# Patient Record
Sex: Female | Born: 1935 | Race: Black or African American | Hispanic: No | Smoking: Former smoker
Health system: Southern US, Community
[De-identification: ages and names within clinical notes are randomized; demographics above are authoritative.]

## PROBLEM LIST (undated history)

## (undated) DIAGNOSIS — Z9119 Patient's noncompliance with other medical treatment and regimen: Secondary | ICD-10-CM

## (undated) DIAGNOSIS — IMO0002 Reserved for concepts with insufficient information to code with codable children: Secondary | ICD-10-CM

## (undated) DIAGNOSIS — I4891 Unspecified atrial fibrillation: Secondary | ICD-10-CM

## (undated) DIAGNOSIS — R609 Edema, unspecified: Secondary | ICD-10-CM

## (undated) DIAGNOSIS — R9430 Abnormal result of cardiovascular function study, unspecified: Secondary | ICD-10-CM

## (undated) DIAGNOSIS — M1990 Unspecified osteoarthritis, unspecified site: Secondary | ICD-10-CM

## (undated) DIAGNOSIS — E785 Hyperlipidemia, unspecified: Secondary | ICD-10-CM

## (undated) DIAGNOSIS — I340 Nonrheumatic mitral (valve) insufficiency: Secondary | ICD-10-CM

## (undated) DIAGNOSIS — R001 Bradycardia, unspecified: Secondary | ICD-10-CM

## (undated) DIAGNOSIS — I272 Pulmonary hypertension, unspecified: Secondary | ICD-10-CM

## (undated) DIAGNOSIS — I351 Nonrheumatic aortic (valve) insufficiency: Secondary | ICD-10-CM

## (undated) DIAGNOSIS — I509 Heart failure, unspecified: Secondary | ICD-10-CM

## (undated) DIAGNOSIS — F0390 Unspecified dementia without behavioral disturbance: Secondary | ICD-10-CM

## (undated) DIAGNOSIS — Z91199 Patient's noncompliance with other medical treatment and regimen due to unspecified reason: Secondary | ICD-10-CM

## (undated) DIAGNOSIS — I10 Essential (primary) hypertension: Secondary | ICD-10-CM

## (undated) DIAGNOSIS — R5383 Other fatigue: Secondary | ICD-10-CM

## (undated) DIAGNOSIS — I2510 Atherosclerotic heart disease of native coronary artery without angina pectoris: Secondary | ICD-10-CM

## (undated) DIAGNOSIS — N184 Chronic kidney disease, stage 4 (severe): Secondary | ICD-10-CM

## (undated) HISTORY — DX: Nonrheumatic mitral (valve) insufficiency: I34.0

## (undated) HISTORY — DX: Other fatigue: R53.83

## (undated) HISTORY — DX: Unspecified dementia, unspecified severity, without behavioral disturbance, psychotic disturbance, mood disturbance, and anxiety: F03.90

## (undated) HISTORY — PX: CARDIAC CATHETERIZATION: SHX172

## (undated) HISTORY — DX: Edema, unspecified: R60.9

## (undated) HISTORY — DX: Hyperlipidemia, unspecified: E78.5

## (undated) HISTORY — DX: Unspecified atrial fibrillation: I48.91

## (undated) HISTORY — DX: Essential (primary) hypertension: I10

## (undated) HISTORY — DX: Abnormal result of cardiovascular function study, unspecified: R94.30

## (undated) HISTORY — DX: Pulmonary hypertension, unspecified: I27.20

## (undated) HISTORY — DX: Patient's noncompliance with other medical treatment and regimen: Z91.19

## (undated) HISTORY — DX: Atherosclerotic heart disease of native coronary artery without angina pectoris: I25.10

## (undated) HISTORY — DX: Unspecified osteoarthritis, unspecified site: M19.90

## (undated) HISTORY — DX: Chronic kidney disease, stage 4 (severe): N18.4

## (undated) HISTORY — PX: CATARACT EXTRACTION: SUR2

## (undated) HISTORY — DX: Reserved for concepts with insufficient information to code with codable children: IMO0002

## (undated) HISTORY — DX: Patient's noncompliance with other medical treatment and regimen due to unspecified reason: Z91.199

## (undated) HISTORY — DX: Nonrheumatic aortic (valve) insufficiency: I35.1

## (undated) HISTORY — DX: Heart failure, unspecified: I50.9

## (undated) HISTORY — DX: Bradycardia, unspecified: R00.1

---

## 1998-03-22 ENCOUNTER — Ambulatory Visit (HOSPITAL_COMMUNITY): Admission: RE | Admit: 1998-03-22 | Discharge: 1998-03-22 | Payer: Self-pay

## 1999-09-23 ENCOUNTER — Ambulatory Visit (HOSPITAL_COMMUNITY): Admission: RE | Admit: 1999-09-23 | Discharge: 1999-09-23 | Payer: Self-pay | Admitting: Internal Medicine

## 2000-01-15 ENCOUNTER — Ambulatory Visit (HOSPITAL_COMMUNITY): Admission: RE | Admit: 2000-01-15 | Discharge: 2000-01-15 | Payer: Self-pay | Admitting: *Deleted

## 2000-12-21 ENCOUNTER — Other Ambulatory Visit: Admission: RE | Admit: 2000-12-21 | Discharge: 2000-12-21 | Payer: Self-pay | Admitting: Family Medicine

## 2001-01-29 ENCOUNTER — Inpatient Hospital Stay (HOSPITAL_COMMUNITY): Admission: EM | Admit: 2001-01-29 | Discharge: 2001-02-02 | Payer: Self-pay | Admitting: *Deleted

## 2001-01-29 ENCOUNTER — Encounter: Payer: Self-pay | Admitting: *Deleted

## 2001-01-29 ENCOUNTER — Encounter (INDEPENDENT_AMBULATORY_CARE_PROVIDER_SITE_OTHER): Payer: Self-pay | Admitting: Specialist

## 2001-01-29 ENCOUNTER — Encounter: Payer: Self-pay | Admitting: Internal Medicine

## 2001-01-31 ENCOUNTER — Encounter: Payer: Self-pay | Admitting: Internal Medicine

## 2001-02-14 ENCOUNTER — Emergency Department (HOSPITAL_COMMUNITY): Admission: EM | Admit: 2001-02-14 | Discharge: 2001-02-14 | Payer: Self-pay | Admitting: Emergency Medicine

## 2001-12-15 ENCOUNTER — Ambulatory Visit (HOSPITAL_COMMUNITY): Admission: RE | Admit: 2001-12-15 | Discharge: 2001-12-15 | Payer: Self-pay | Admitting: Internal Medicine

## 2001-12-15 ENCOUNTER — Encounter: Payer: Self-pay | Admitting: Internal Medicine

## 2001-12-30 ENCOUNTER — Encounter: Payer: Self-pay | Admitting: Internal Medicine

## 2001-12-30 ENCOUNTER — Encounter: Admission: RE | Admit: 2001-12-30 | Discharge: 2001-12-30 | Payer: Self-pay | Admitting: Internal Medicine

## 2002-03-07 ENCOUNTER — Encounter: Payer: Self-pay | Admitting: Ophthalmology

## 2002-03-10 ENCOUNTER — Ambulatory Visit (HOSPITAL_COMMUNITY): Admission: RE | Admit: 2002-03-10 | Discharge: 2002-03-11 | Payer: Self-pay | Admitting: Ophthalmology

## 2002-03-23 ENCOUNTER — Encounter: Admission: RE | Admit: 2002-03-23 | Discharge: 2002-03-23 | Payer: Self-pay | Admitting: Internal Medicine

## 2002-07-07 ENCOUNTER — Encounter: Admission: RE | Admit: 2002-07-07 | Discharge: 2002-07-07 | Payer: Self-pay | Admitting: Internal Medicine

## 2002-07-22 ENCOUNTER — Encounter: Admission: RE | Admit: 2002-07-22 | Discharge: 2002-07-22 | Payer: Self-pay | Admitting: Internal Medicine

## 2002-08-29 ENCOUNTER — Ambulatory Visit (HOSPITAL_COMMUNITY): Admission: RE | Admit: 2002-08-29 | Discharge: 2002-08-29 | Payer: Self-pay | Admitting: Internal Medicine

## 2002-08-29 ENCOUNTER — Encounter: Payer: Self-pay | Admitting: Internal Medicine

## 2002-08-29 ENCOUNTER — Encounter: Admission: RE | Admit: 2002-08-29 | Discharge: 2002-08-29 | Payer: Self-pay | Admitting: Internal Medicine

## 2002-09-02 ENCOUNTER — Encounter: Admission: RE | Admit: 2002-09-02 | Discharge: 2002-10-05 | Payer: Self-pay | Admitting: Internal Medicine

## 2002-09-16 ENCOUNTER — Encounter: Payer: Self-pay | Admitting: Internal Medicine

## 2002-09-16 ENCOUNTER — Encounter: Admission: RE | Admit: 2002-09-16 | Discharge: 2002-09-16 | Payer: Self-pay | Admitting: Internal Medicine

## 2002-09-16 ENCOUNTER — Inpatient Hospital Stay (HOSPITAL_COMMUNITY): Admission: AD | Admit: 2002-09-16 | Discharge: 2002-09-20 | Payer: Self-pay | Admitting: Internal Medicine

## 2002-09-20 ENCOUNTER — Encounter: Payer: Self-pay | Admitting: Cardiology

## 2002-09-23 ENCOUNTER — Encounter: Admission: RE | Admit: 2002-09-23 | Discharge: 2002-09-23 | Payer: Self-pay | Admitting: Internal Medicine

## 2002-09-26 ENCOUNTER — Encounter: Admission: RE | Admit: 2002-09-26 | Discharge: 2002-09-26 | Payer: Self-pay | Admitting: Internal Medicine

## 2002-10-19 ENCOUNTER — Encounter: Admission: RE | Admit: 2002-10-19 | Discharge: 2002-10-19 | Payer: Self-pay | Admitting: Internal Medicine

## 2003-06-09 ENCOUNTER — Encounter: Admission: RE | Admit: 2003-06-09 | Discharge: 2003-06-09 | Payer: Self-pay | Admitting: Internal Medicine

## 2003-06-09 ENCOUNTER — Encounter: Payer: Self-pay | Admitting: Internal Medicine

## 2003-07-14 ENCOUNTER — Encounter: Payer: Self-pay | Admitting: Internal Medicine

## 2003-07-14 ENCOUNTER — Ambulatory Visit (HOSPITAL_COMMUNITY): Admission: RE | Admit: 2003-07-14 | Discharge: 2003-07-14 | Payer: Self-pay | Admitting: Internal Medicine

## 2004-12-06 ENCOUNTER — Ambulatory Visit: Payer: Self-pay | Admitting: Gastroenterology

## 2004-12-11 ENCOUNTER — Ambulatory Visit (HOSPITAL_COMMUNITY): Admission: RE | Admit: 2004-12-11 | Discharge: 2004-12-11 | Payer: Self-pay | Admitting: Gastroenterology

## 2004-12-17 ENCOUNTER — Ambulatory Visit: Payer: Self-pay | Admitting: Gastroenterology

## 2005-01-08 ENCOUNTER — Ambulatory Visit: Payer: Self-pay | Admitting: Internal Medicine

## 2005-03-07 ENCOUNTER — Ambulatory Visit: Payer: Self-pay | Admitting: Internal Medicine

## 2005-05-09 ENCOUNTER — Encounter: Admission: RE | Admit: 2005-05-09 | Discharge: 2005-05-09 | Payer: Self-pay | Admitting: Internal Medicine

## 2005-06-26 ENCOUNTER — Ambulatory Visit: Payer: Self-pay | Admitting: Internal Medicine

## 2005-09-25 ENCOUNTER — Ambulatory Visit: Payer: Self-pay | Admitting: Internal Medicine

## 2005-09-26 ENCOUNTER — Ambulatory Visit: Payer: Self-pay | Admitting: Internal Medicine

## 2005-12-29 ENCOUNTER — Ambulatory Visit: Payer: Self-pay | Admitting: Internal Medicine

## 2006-01-06 ENCOUNTER — Ambulatory Visit: Payer: Self-pay | Admitting: Internal Medicine

## 2006-01-06 ENCOUNTER — Ambulatory Visit (HOSPITAL_COMMUNITY): Admission: RE | Admit: 2006-01-06 | Discharge: 2006-01-06 | Payer: Self-pay | Admitting: Internal Medicine

## 2006-05-27 ENCOUNTER — Ambulatory Visit: Payer: Self-pay | Admitting: Internal Medicine

## 2006-09-14 ENCOUNTER — Ambulatory Visit: Payer: Self-pay | Admitting: Internal Medicine

## 2006-10-26 ENCOUNTER — Ambulatory Visit: Payer: Self-pay | Admitting: Internal Medicine

## 2007-04-06 ENCOUNTER — Ambulatory Visit: Payer: Self-pay | Admitting: Internal Medicine

## 2007-04-06 LAB — CONVERTED CEMR LAB
Albumin: 4 g/dL (ref 3.5–5.2)
Alkaline Phosphatase: 84 units/L (ref 39–117)
Bilirubin, Direct: 0.1 mg/dL (ref 0.0–0.3)
CO2: 27 meq/L (ref 19–32)
Creatinine, Ser: 1.6 mg/dL — ABNORMAL HIGH (ref 0.4–1.2)
Eosinophils Absolute: 0.1 10*3/uL (ref 0.0–0.6)
Folate: 18.5 ng/mL
GFR calc non Af Amer: 34 mL/min
Hemoglobin: 11.6 g/dL — ABNORMAL LOW (ref 12.0–15.0)
Lymphocytes Relative: 27.1 % (ref 12.0–46.0)
Monocytes Absolute: 1 10*3/uL — ABNORMAL HIGH (ref 0.2–0.7)
Neutrophils Relative %: 64 % (ref 43.0–77.0)
Platelets: 281 10*3/uL (ref 150–400)
Potassium: 4.1 meq/L (ref 3.5–5.1)
RBC: 3.53 M/uL — ABNORMAL LOW (ref 3.87–5.11)
Sodium: 141 meq/L (ref 135–145)
Total Protein: 7.2 g/dL (ref 6.0–8.3)
Vitamin B-12: 396 pg/mL (ref 211–911)

## 2007-04-16 ENCOUNTER — Ambulatory Visit: Payer: Self-pay | Admitting: Cardiology

## 2007-04-29 ENCOUNTER — Ambulatory Visit: Payer: Self-pay | Admitting: Internal Medicine

## 2007-05-04 ENCOUNTER — Ambulatory Visit: Payer: Self-pay | Admitting: Internal Medicine

## 2007-10-15 ENCOUNTER — Encounter: Admission: RE | Admit: 2007-10-15 | Discharge: 2007-10-15 | Payer: Self-pay | Admitting: Internal Medicine

## 2008-11-07 ENCOUNTER — Encounter: Admission: RE | Admit: 2008-11-07 | Discharge: 2008-11-07 | Payer: Self-pay | Admitting: Internal Medicine

## 2009-05-08 ENCOUNTER — Emergency Department (HOSPITAL_COMMUNITY): Admission: EM | Admit: 2009-05-08 | Discharge: 2009-05-08 | Payer: Self-pay | Admitting: Emergency Medicine

## 2009-08-04 ENCOUNTER — Encounter: Admission: RE | Admit: 2009-08-04 | Discharge: 2009-08-04 | Payer: Self-pay | Admitting: Internal Medicine

## 2010-02-26 ENCOUNTER — Emergency Department (HOSPITAL_COMMUNITY)
Admission: EM | Admit: 2010-02-26 | Discharge: 2010-02-26 | Payer: Self-pay | Source: Home / Self Care | Admitting: Family Medicine

## 2010-12-22 ENCOUNTER — Encounter: Payer: Self-pay | Admitting: Internal Medicine

## 2011-02-24 LAB — POCT I-STAT, CHEM 8
Calcium, Ion: 1.27 mmol/L (ref 1.12–1.32)
Chloride: 104 mEq/L (ref 96–112)
Glucose, Bld: 102 mg/dL — ABNORMAL HIGH (ref 70–99)
Hemoglobin: 13.3 g/dL (ref 12.0–15.0)
TCO2: 29 mmol/L (ref 0–100)

## 2011-03-10 LAB — URIC ACID: Uric Acid, Serum: 5.4 mg/dL (ref 2.4–7.0)

## 2011-04-15 NOTE — Procedures (Signed)
[facility]                                PROCEDURE NOTE   NAME:Kristi [name], Kristi [name]                   MRN:          [id number]  DATE:04/06/2007                            DOB:          1931/09/25    PROCEDURE:  Left shoulder injection.   INDICATION:  Refractory shoulder arthritis, subacromial bursitis.  Risks  including unsuccessful procedure, bleeding, infection and other, as well  as benefits were explained to the patient and her daughter; they agreed  to proceed.   DESCRIPTION OF PROCEDURE:  The left shoulder was prepped with Betadine  and alcohol and injected in usual fashion with 80 mg of Depo-Medrol and  3 mL of 2% lidocaine (posterior approach), tolerated well, complications  -- none, excellent pain relief immediately following the procedure.     Georgina Quint. Plotnikov, MD     AVP/MedQ  DD: 04/06/2007  DT: 04/07/2007  Job #: [id number]

## 2011-04-18 NOTE — Op Note (Signed)
[place]. [facility]  Patient:    Kristi [name], Kristi [name]                   MRN: [id number] Proc. Date: 01/31/01 Adm. Date:  [id number] Attending:  Mervin Hack                           Operative Report  PREOPERATIVE DIAGNOSES:  Choledocholithiasis and cholelithiasis.  POSTOPERATIVE DIAGNOSES:  Choledocholithiasis and cholelithiasis.  PROCEDURE:  Laparoscopic cholecystectomy with intraoperative cholangiogram.  SURGEON:  Jimmye Norman, M.D.  ASSISTANT:  Thornton Park. Daphine Deutscher, M.D.  ANESTHESIA:  General endotracheal.  ESTIMATED BLOOD LOSS:  Less than 30 cc.  COMPLICATIONS:  None.  DISPOSITION:  Stable.  INDICATION FOR OPERATION:  The patient is a 77 year old female with abdominal pain, fevers, chills, white count of 17,000, and evidently cholangitis.  She underwent an ERCP with common duct stone removal on the first and has defervesced and done well since that time.  She feels much better.  Now she is coming in for an interval cholecystectomy.  FINDINGS:  The patient had some edematous changes around the gallbladder and the gallbladder bed.  The cholangiogram demonstrated the long cystic duct stump which is about, say 8 mm in width, and a markedly dilated common bile duct with flow into the duodenum with proximal filling also.  There was no evidence of recurrent stone, although there was some spasm and edema around the drainage area.  DESCRIPTION OF PROCEDURE:  The patient was taken to the operating room and placed on the table in supine position.  After an adequate general anesthetic was administered, she was prepped and draped in the usual sterile manner, exposing the midline and the right upper quadrant of the abdomen.  A supraumbilical incision, a linear incision, was made using a #11 blade.  It was taken down to the midline fascia using a hemostat, and then subsequently through that midline fascia a Veress needle was passed into the  peritoneal cavity and confirmed to be in adequate position using the saline test.  Once this was done, carbon dioxide insufflation was instilled through the Veress needle into the peritoneal cavity up to a maximal intra-abdominal pressure of 15 mmHg.  A 12 mm cannula was passed through the supraumbilical fascia into the peritoneal cavity and confirmed to be in adequate position using the laparoscope with attached camera and light source.  Subsequently, two 5 mm cannulas were placed in the right costal margin area and the subxiphoid, and a 12 mm cannula was passed into the peritoneal cavity under direct vision.  Once all was in place, the patient was placed in steep reverse Trendelenburg position, the left side was tilted down, and the dissection begun.  The patient was noted to have some adhesions between the anterior abdominal wall and the diaphragm and the liver, and these were taken down using sharp dissection with endoscopic scissors.  Once this was done, we were able to grasp the dome of the gallbladder and retract it toward the right upper quadrant.  There were noted to be adhesions between the omentum and the right transverse colon and the gallbladder, which were taken down with sharp and blunt dissection and electrocautery.  Care was taken not to injure the bowel during this procedure.  Once we were able to take down these adhesions adequately, the infundibulum of the gallbladder could be grasped separately. We were able to dissect out the peritoneum overlying the  triangle of Calot and what was thought to be the hepatobiliary triangle.  We isolated the tubular structure outside of the triangle of Calot, which appeared to be the cystic duct, at the margin of the triangle of Calot.  This turned out to be the cystic artery, and when cut, blood flowed approximately 30 seconds until we could get control of it and cleanse our laparoscopic field.  We were able to get proximal control  with the Endoclips, subsequently transected and then isolated the 8 mm cystic duct, and placed proximal clips along the gallbladder side for the cholecystodochotomy and the cholangiogram.  Once this was done, we introduced a Reddick catheter, the _____ 5 mm cannula and the introducer, passing it into the cholecystodochotomy, and performing the cholangiogram, which was described previously.  It demonstrated a markedly dilated common bile duct, a long cystic duct.  There was proximal filling and flow into the duodenum.  Once the cholangiogram was completed, the Reddick catheter was removed from the cholecystodochotomy.  Three distal endoclips were placed on the cystic duct and it subsequently was transected.  We were able to dissect out the gallbladder from its bed with minimal difficulty, getting hemostasis with electrocautery.  We then were able to bring the gallbladder out through the supraumbilical fascia with minimal difficulty.  We irrigated with 2 L of warm saline solution, removing most of the _____, which had accumulated when the cystic artery was initially cut.  We then allowed all gas to escape through the open incisions.  We then reapproximated the midline supraumbilical fascia using a figure-of-eight stitch of 0 Vicryl. Marcaine 0.25% with epinephrine was injected at all sites.  The skin was closed using running subcuticular stitch of 4-0 Vicryl.  All needle counts, sponge counts, and instrument counts were correct.  The patient was taken to the recovery room in stable condition with sterile dressings applied. DD:  01/31/01 TD:  02/01/01 Job: [id number]

## 2011-04-18 NOTE — Assessment & Plan Note (Signed)
[facility]                           PRIMARY CARE OFFICE NOTE   NAME:[name], Kristi SCINTO                   MRN:          [id number]  DATE:10/26/2006                            DOB:          [birth year]    PROCEDURE:  Left shoulder injection.   INDICATIONS FOR PROCEDURE:  Refractory recurrent subacromial bursitis,  left shoulder cuff tendonitis.   The risks including unsuccessful procedure, bleeding, infection, and  others as well as the benefits were explained to the patient in detail.  She wanted to proceed. She was placed on the exam table, the area was  scrubbed with Betadine and alcohol and injected with 3 mL of 2%  lidocaine with 70 mg of Depo-Medrol in the usual fashion. Tolerated it  well, complications none. Good pain relief immediately following the  procedure.   PROCEDURE #2:  Trigger point injection.   INDICATIONS FOR PROCEDURE:  Trigger point left trapezius muscle.   The painful area was identified. The risks and benefits were explained  to the patient as above. She agreed to proceed. The area was prepped  with Betadine and alcohol and injection with 1 mL of 2% lidocaine and 10  mg Depo-Medrol with good pain relief.     Georgina Quint. Plotnikov, MD  Electronically Signed    AVP/MedQ  DD: 10/31/2006  DT: 11/02/2006  Job #: [id number]

## 2011-04-18 NOTE — Consult Note (Signed)
NAME:  Kristi [name], Kristi [name]                      ACCOUNT NO.:  [id number]   MEDICAL RECORD NO.:  [id number]                   PATIENT TYPE:  INP   LOCATION:  3711                                 FACILITY:  [facility]   PHYSICIAN:  Willa Rough, MD [facility]                DATE OF BIRTH:  [birth year]   DATE OF CONSULTATION:  09/17/2002  DATE OF DISCHARGE:                                   CONSULTATION   REASON FOR CONSULTATION:  The patient is seen for the evaluation of chest  pain with a known history of coronary disease.  She was admitted on September 16, 2002.  Recently she has had some chest discomfort.  She had a prolonged  episode and came to the hospital.  There is a known history of coronary  disease.  The patient had an intervention in the early 1990s (1991).  She  had a followup catheterization in either 1995 or 1996, by Dr. Eden Emms, of our  group, showing no significant obstructive disease at that time.  Since then,  she has had rare pain, but recently had a definite increase and now presents  with more significant pain.   ALLERGIES:  No known drug allergies.   MEDICATIONS:  1. Toprol XL.  2. Hydrochlorothiazide.  3. Clonidine.  4. Triamterene.   PAST MEDICAL HISTORY:  See the complete list below.   SOCIAL HISTORY:  The patient is divorced.  She lives alone.   FAMILY HISTORY:  There is no documented significant cardiac history.   REVIEW OF SYMPTOMS:  GENERAL:  The patient has not had significant fevers or  chills.  She has had chest pain as outlined.  HEENT:  No major problems.  GI:  No major symptoms.  GU:  No major symptoms.  INTEGUMENTARY:  No  significant rashes.  NEUROLOGIC:  The patient has had a headache and some  dizziness.   PHYSICAL EXAMINATION:  GENERAL:  Today, the patient is quite stable.  She is  not having any chest discomfort.  VITAL SIGNS:  Blood pressure 140/85, temperature 97.3.  LUNGS:  Clear.  NECK:  Normal.  HEENT:  Reveals no significant  abnormalities.  CARDIAC:  S1, S2, but no clicks or significant murmurs.  ABDOMEN:  Benign.  EXTREMITIES:  She has no peripheral edema.  There are no obvious skin  lesions and no obvious musculoskeletal deformities.   LABORATORY DATA AND X-RAY FINDINGS:  Her EKG shows an interventricular  conduction delay of the right bundle branch block type and T waves in leads  I and L.  She also has inverted T wave in I and L.  Chest x-ray data is not  available to me at this time, but I do not believe it showed any significant  abnormality.   CPK was 644 on admission decreasing to 500 and then to 400.  However, two  troponins have been completely normal and the MBs have been  normal.   IMPRESSION:  1. Mild resting bradycardia here, but she has had no symptomatic dizziness.  2. Hyperlipidemia.  3. History of hypertension.  4. Documented coronary disease with intervention in the early 1990s.  The     patient now returns with significant chest pain.  Medications and cardiac     catheterization will be indicated.  5. Telemetry today showed five beats of bradycardia with an axis different     than her usual tracing.  This may be ventricular tachycardia.  Potassium     was normal yesterday.  Potassium and magnesium will be rechecked today.  6. Gastroesophageal reflux disease.  7. Interventricular conduction delay of the right bundle branch type.  8. History of cholecystectomy.   RECOMMENDATIONS:  1. The patient is stable at this point.  She is on aspirin.  Lovenox has     been started.  Despite resting mild bradycardia, I have restarted low     dose beta-blockade for the rhythm described above because if she has     coronary artery disease, I do not want her withdrawing completely from     her beta-blocker which she takes regularly at home.  2. Catheterization is planned for September 19, 2002.  The patient agrees.                                                 Willa Rough, MD [facility]    JK/MEDQ   D:  09/17/2002  T:  09/18/2002  Job:  [id number]   cc:   Madaline Guthrie, MD

## 2011-04-18 NOTE — Discharge Summary (Signed)
[place]. [facility]  Patient:    Kristi [name], Kristi [name]                   MRN: [id number] Adm. Date:  [id number] Disc. Date: [id number] Attending:  Molpus, Carlisle Beers CC:         Barbette Hair. Arlyce Dice, M.D. [facility]   Discharge Summary  DISCHARGE DIAGNOSES: 1. Acute cholecystitis and cholelithiasis. 2. Choledocholithiasis.  PRINCIPAL PROCEDURES: 1. Laparoscopic cholecystectomy. 2. Endoscopic retrograde cholangiopancreatography with endoscopic removal of    common bile duct stones.  SURGEON:  Jimmye Norman, M.D.  CONSULTANTS:  Barbette Hair. Arlyce Dice, M.D.  DISCHARGE MEDICATIONS:  Vicodin as needed for pain control.  DIET ON DISCHARGE:  Regular.  CONDITION ON DISCHARGE:  Stable.  FOLLOW-UP:  She was to follow up to see me in two weeks.  BRIEF SUMMARY OF HOSPITAL COURSE:  The patient is a 77 year old female with abdominal pain.  I was asked to see her.  She was found to have common bile ducts by GI.  She underwent an ERCP with retrieval of stones.  Her total bilirubin was elevated initially.  We admitted her.  After ERCP, she went to surgery for a laparoscopic cholecystectomy which was done eventfully and she subsequently went home three days after that procedure.  She otherwise did fine and was to return to see me in two weeks. DD:  04/08/01 TD:  04/10/01 Job: [id number]

## 2011-04-18 NOTE — H&P (Signed)
[place]. [facility]  Patient:    Kristi [name], Kristi [name]                   MRN: [id number] Adm. Date:  [id number] Attending:  Mervin Hack Dictator:   Dianah Field, P.A.                         History and Physical  ADDENDUM  The previous dictation number was [id number], please attach to that.  ADDITIONAL LABORATORY DATA:  CBC.  Her white count is 17.6, hemoglobin 12.8, hematocrit 38.5, MCV 94.2, and platelets of 261.  On differential she had 91% neutrophils, 16% granulocytes, 5% lymphocytes, 2% monocytes with 1% eosinophils and no basophils.  This represents a left shift. DD:  01/29/01 TD:  01/30/01 Job: [id number]

## 2011-04-18 NOTE — Op Note (Signed)
[place]. [facility]  Patient:    Kristi [name], Kristi [name] Visit Number: [id number] MRN: [id number]          Service Type: DSU Location: [phone] Attending Physician:  Karenann Cai Dictated by:   Marya Landry Carlyle Lipa., M.D. Admit Date:  03/10/2002                             Operative Report  PREOPERATIVE DIAGNOSIS:  Immature cataract, left eye.  POSTOPERATIVE DIAGNOSIS:  Immature cataract, left eye.  PROCEDURE:  Kelman phacoemulsification of cataract, left eye.  ANESTHESIA:  Local using Xylocaine 2% with Marcaine 0.75% and Wydase.  JUSTIFICATION FOR PROCEDURE:  This is a 77 year old lady who complains of blurring of vision with difficulty seeing to read.  She was evaluated and found to have are bilateral immature cataracts, worse on the left than the right.  Her visual acuity in the left eye was best corrected to 20/60. Cataract extraction with intraocular lens implantation was recommended, and she is admitted at this time for that purpose.  DESCRIPTION OF PROCEDURE:  Under the influence of IV sedation and Van Lint akinesia, a retrobulbar anesthesia was given.  The patient was prepped and draped in the usual manner.  The lid speculum was inserted under the upper and lower lid of the left eye, and a 4-0 silk traction suture was passed through the belly of the superior rectus muscle for traction.  A fornix-based conjunctival flap was turned and hemostasis was achieved by using cautery.  An incision made in the sclera approximately 1 mm posterior to the limbus.  This incision was dissected down to clear cornea using the crescent blade.  A side port incision was made at the 1:30 clock hour position, and Occucoat was injected into the eye through the side port incision.  The anterior chamber was then entered through the corneoscleral tunnel incision at the 11:30 position.  An anterior capsulotomy was done using a bent 25-gauge needle.   The nucleus was hydrodissected using Xylocaine.  The KPE handpiece was passed into the eye, and the nucleus was emulsified without difficulty.  The residual cortical material was aspirated.  The posterior capsule was polished using the olive-tip polisher.  A foldable silicone lens was then seated into the eye behind the iris without difficulty.  The anterior chamber was reformed, and the pupil was constricted using Miochol.  The residual Occucoat was aspirated from the eye.  The lips of the wound were hydrated, tested to make sure that there was no leak.  After ascertaining that there was no leak, the conjunctiva was closed over the wound using thermal cautery.  Celestone 1 cc and 0.5 cc of gentamicin were then injected subconjunctivally.  Maxitrol ophthalmic ointment and Pilopine ointment were applied along with a patch and Fox shield. The patient tolerated the procedure well, was discharged to postanesthesia recovery room in satisfactory condition.  Because she has a variety of medical problems and because she lives alone, she will be admitted for overnight observation and discharged tomorrow if her condition is satisfactory.  DISCHARGE DIAGNOSIS:  Immature cataract, left eye. Dictated by:   Marya Landry Carlyle Lipa., M.D. Attending Physician:  Karenann Cai DD:  03/11/02 TD:  03/12/02 Job: [id number]

## 2011-04-18 NOTE — Discharge Summary (Signed)
NAME:  Kristi [name], Kristi [name]                      ACCOUNT NO.:  [id number]   MEDICAL RECORD NO.:  [id number]                   PATIENT TYPE:  REC   LOCATION:  [facility]                                 FACILITY:  [facility]   PHYSICIAN:  Lorne Skeens, M.D.                   DATE OF BIRTH:  02/22/1931   DATE OF ADMISSION:  09/02/2002  DATE OF DISCHARGE:  09/20/2002                                 DISCHARGE SUMMARY   RESIDENT:  Lorne Skeens, M.D.   PRIMARY CARE PHYSICIAN:  Dineen Kid. Reche Dixon, M.D. - Outpatient Clinic   CONSULTATIONS:  Dr. Willa Rough.   DISCHARGE DIAGNOSES:  1. Stable angina.  2. Coronary artery disease.  3. Bradycardia.  4. Hypertension.  5. Hyperlipidemia.  6. Gastroesophageal reflux disease.  7. Right bundle branch block.   DISCHARGE MEDICATIONS:  1. Maxzide 37.5/25 mg tab one p.o. q.d.  2. Aspirin 81 mg one p.o. q.d.  3. Protonix 40 mg one p.o. q.d.  4. Altace 2.5 mg one p.o. q.d.  5. Nitrostat 0.4 mg tab one sublingual q.5 minutes p.r.n. chest pain.   MEDICATIONS GIVEN:  1. Clonidine.  2. Toprol XL.  3. Zocor, which was started in the hospital.   DISCHARGE INSTRUCTIONS:  1. She is to take her Nitrostat for chest pain, and to not exceed three     doses.  If she continues to have chest pain, especially at rest, she is     to call her primary M.D. or come directly to the emergency room.  2. She is also to continue her cardiac rehab, and to stop for chest pain or     dizziness.  3. Her diet should include a low-cholesterol diet.  4. She is to keep her catheterization site clean daily.  5. A follow-up appointment will be sent up in the [facility].  I will call her with an appointment, to be     seen in the next week, where we will check a BMET.   HISTORY OF PRESENT ILLNESS:  The patient is a 77 year old black female with  a past medical history significant for coronary artery disease.  She has  received an angioplasty at  cardiac catheterization x2 in the past, a history  of angina and hypertension, who presented to the [facility] with the chief complaint of chest pain for one week.  She  explained the pain was like a lump across her chest on the left side,  without radiation.  The pain started during physical therapy while she was  exercising.  She also at this time complained of some dyspnea and dizziness.  She denied any diaphoresis or syncope.  The chest pain and shortness of  breath improved with rest.  She had a similar feeling in April while she was  in the grocery store.  At this  time she was diagnosed with angina, and was  given nitroglycerin, and had improvement.  During her current exacerbation  of chest pain she did not take any nitroglycerin, because her pain resolved  with rest.  She denied any recent upper respiratory infections or increasing  stress.  She has been feeling more fatigued over the last few weeks,  especially when doing her exercise, and the dizziness only came on when she  was exercising.  It did not seem to be positional or have any changes with  it.  Her risk factors include being hypertensive, a former smoker, post-  menopausal, a family history of heart disease, and hypercholesterolemia.   PHYSICAL EXAMINATION:  VITAL SIGNS:  On her initial presentation her  temperature was 97.3 degrees, pulse 40-45, blood pressure 128/85,  respirations 20, O2 saturation 100% on room air.  LUNGS:  Her initial physical exam her lungs were clear to auscultation  bilaterally without wheezing, rales, or rhonchi.  She had good inspiratory  effort and good air movement.  CARDIOVASCULAR:  She was sinus bradycardic, with a 2/6 systolic murmur, best  heard at the left upper sternal border.  NEUROLOGIC:  She was intact.  Everything else was normal on her initial  presentation.   LABORATORY DATA:  Her initial set of labs showed a CBC with a white count of  9.2,  hemoglobin 11.9, hematocrit 36.6, platelet count 235.  Her initial  chemistry panel showed a sodium of 139, potassium 4.0, chloride 103,  bicarbonate 28, BUN 27, creatinine 0.9, glucose 96.  Her liver enzymes were  all within normal limits.  Her coagulation factor showed a PT of 13.2, INR  1.0, PTT 40.  Cardiac enzymes were obtained x3 q.8h., showing an elevation  in the CK, first value of 644, second value 558, third value 401.  Troponin  remained normal at 0.02.  CK-MB remained normal from 2.7 to 2.3 to 1.9.   HOSPITAL COURSE:  #1 - STABLE, VERSUS UNSTABLE ANGINA:  Indeterminate by her  physical exam, as the chest pain did resolve with rest.  Initial  electrocardiogram was done showing sinus bradycardia with a heart rate of  39, with a widened QRS.  She also had T-wave inversions and Q-waves in leads  II, III, and aVF.  The T-wave inversion was in the lateral leads.  A cardiac  consultation was obtained, and Dr. Myrtis Ser saw the patient on September 18, 2002.  A cardiac catheterization was performed on September 19, 2002, showing the  following results:  The left anterior descending coronary artery had 30%-40%  multiple discrete lesions in the mid and distal vessel.  The circumflex  coronary artery was serpiginous and normal.  The right coronary artery  showed a 50% tubular lesion proximally.  The mid and distal vessels were  normal.  There were 20% multiple discrete lesions in the PDA.  The ejection  fraction was 60%.  The impression by the cardiologist was no critical  coronary artery disease.  She also had no wall motion abnormalities.  Due to  her negative cardiac enzymes and her negative cardiac catheterization, it  was felt that her dizzy spells and chest pain most likely were related to  another etiology besides cardiac.  The patient was sent home on Protonix for  any chest pain that could have been related to gastroesophageal reflux disease.  We also held her beta blocker, as her  bradycardia could have been  contributing to her feelings of dizziness and fatigue.  Her heart rate  improved from the high 30s to 40s up to the 50s and low 60s at discharge.  She was given Lovenox on her second hospital day for acute myocardial  infarction prophylaxis.  This was discontinued on the day prior to her  cardiac catheterization.  #2 - HYPERLIPIDEMIA:  On admission the patient was involved in a lipid study  with the [facility].  It was unknown which cholesterol medicine  she was on, so we obtained a fasting lipid panel, showing a total  cholesterol of 175 and HDL of 32, a triglyceride count of 253, and an LDL of  92.  She was placed on Zocor 20 mg q.h.s. for two days during her hospital  stay.  On discharge day, the patient was complaining of myositis, so the  Zocor was discontinued.  She will need to be followed up as an outpatient  and placed on a different cholesterol-lowering medication.  #3 - DIZZINESS:  This was likely secondary to her bradycardia.  She was not  orthostatic during her stay.  She was able to ambulate to the restroom  without any problems.  On physical exam she did not have any carotid bruits.  #4 - HYPERTENSION:  The patient remained on her  triamterene/hydrochlorothiazide, and will be discharged home on the same  dose.  Her blood pressures remained relatively stable without her clonidine  or Toprol during her stay.  This should be followed up as an outpatient, as  we have discontinued her clonidine, and her Toprol XL.  Would hold off on  prescribing beta blockers or calcium channel blockers due to her severe  bradycardia.   ADDITIONAL STUDIES:  1. A chest x-ray on admission which showed mild cardiomegaly, with minimal     right lower lobe atelectasis, a tortuous aorta with degenerative joint     disease.  2. Electrocardiograms remained the same, with normal sinus rhythm, a QRS     duration of 122 msec, T-wave inversion in lead I, aVL and V5,  and Q-waves     in lead II, III and aVF.  The rhythm is also sinus bradycardia.  3. Her telemetry strip did show one run of V-tachycardia on Sunday night.     She had five beats of V-tachycardia and was asymptomatic.  At the time     the patient had been sleeping, and she had no further beats of V-     tachycardia during her stay.  4. A 2-D echocardiogram was done on the morning of discharge.  It was     ordered on the day of admission but had been delayed, due to scheduling.     Follow-up results as an outpatient.   FOLLOW UP:  Her 2-D echocardiogram results, her lipid status as she is  currently off of her cholesterol-lowering medications, and she could not  tolerate Zocor due to myositis.  Follow up on her blood pressure and heart  rate, as her medications have been changed on discharge.  A repeat  electrocardiogram may be necessary, to look for the T-wave inversions that were seen on the electrocardiograms during her hospital stay.  It is felt  that her Q-waves are from an old myocardial infarction.  The Q-waves may  have been a new finding, but because her cardiac catheterization did not  show any significant lesions, it is unlikely that she is surely having  unstable angina.  Lorne Skeens, M.D.    Erick Alley  D:  09/20/2002  T:  09/21/2002  Job:  [id number]   cc:   Dineen Kid. Reche Dixon, M.D.   Willa Rough, M.D.

## 2011-04-18 NOTE — Cardiovascular Report (Signed)
NAME:  Kristi [name], Kristi [name]                      ACCOUNT NO.:  [id number]   MEDICAL RECORD NO.:  [id number]                   PATIENT TYPE:  INP   LOCATION:  3711                                 FACILITY:  [facility]   PHYSICIAN:  Charlton Haws, MD [facility]                DATE OF BIRTH:  1930/12/27   DATE OF PROCEDURE:  DATE OF DISCHARGE:                              CARDIAC CATHETERIZATION   PROCEDURE:  Coronary arteriography.   INDICATION:  The patient has previously known coronary artery disease.  Unfortunately, she was previously taken care of by Dr. Meryl Crutch.  Details in the old catheterization report are very sketchy and there is no  adequate cine documentation.  She had a catheterization in 1991 followed two  days later by angioplasty of the LAD diagonal.  There were no stents used.  The patient had a followup catheterization by me in 1995, which essentially  showed noncritical disease in the LAD.  She was admitted for vague symptoms  of chest pain and spells with occasional dizziness by the Teaching  Service.  Recommendations were made for repeat catheterization by Dr. Myrtis Ser  in the Teaching Service.   DESCRIPTION OF PROCEDURE:  Standard catheterization was done from the right  femoral artery.   RESULTS:  Left main coronary artery was normal.   Left anterior descending artery had 30-40% multiple discrete lesions of the  mid and distal vessel.   The first diagonal branch had 20-30% multiple discrete lesions.   The circumflex coronary artery was serpiginous and normal.   The right coronary artery had a shepherd's crook right takeoff.  There was a  50% tubular lesion proximally.  The mid and distal vessel were normal.  There were 20% multiple discrete lesions in the PDA.   RIGHT ANTERIOR OBLIQUE VENTRICULOGRAPHY:  RAO ventriculography was normal.  EF was 60%.  There was no gradient across the aortic valve and no MR.  Aortic pressure is 156/83, LV pressure is 156/90.   IMPRESSION:  The patient has no critical coronary disease.  Her symptoms  were not typical of angina.  She is ruled out for myocardial infarction and  has no wall motion abnormalities.  I think this is fairly conclusive  evidence that her spells are not coming from her heart.  Since she was  somewhat bradycardic and did not have critical coronary disease, I think it  would be reasonable to discharge her on a lower dose of Lopressor such as  12.5 b.i.d. or stop this altogether and treat her hypertension with ACE  inhibitors.   Further recommendations will be based on Primary Teaching Service work-up.  The patient can be discharged in the morning if they do not pursue any  further work-up.  Charlton Haws, MD [facility]    PN/MEDQ  D:  09/19/2002  T:  09/19/2002  Job:  [id number]

## 2011-04-18 NOTE — Discharge Summary (Signed)
[place]  Patient:    Kristi [name], Kristi [name] Visit Number: [id number] MRN: [id number]          Service Type: DSU Location: [phone] Attending Physician:  Karenann Cai Dictated by:   Marya Landry Carlyle Lipa., M.D. Admit Date:  03/10/2002                             Discharge Summary  HISTORY:  This is a 77 year old lady who was admitted for cataract extraction of her left eye.  She had been complaining of blurring of her vision with difficulty seeing to read.  She was evaluated and found to have a visual acuity best corrected at 20/50 on the right and 20/60 on the left.  There were bilateral cataracts, worse on the left than the right.  Cataract extraction of the left eye with intraocular lens implantation was recommended and she was admitted for that purpose.  PROCEDURE:  The patient was taken to the operating room where an uneventful cataract extraction was done with phacoemulsification technique and intraocular lens implantation was done.  She was admitted for overnight observation.  HOSPITAL COURSE:  Her hospital course was uneventful.  This morning, the patient is comfortable, the cornea is clear, the anterior chamber is deep, and the wound is secure.  The intraocular lens is stable.  She is, therefore, going to be discharged with instructions to use Prednisone Forte four times a day, Ciloxan, and Voltaren four times a day until gone.  DISCHARGE INSTRUCTIONS: 1. She is refrain from any strenuous activity such as stooping, bending, or    lifting. 2. She is to see me in the office on Tuesday or Wednesday for further    evaluation.  DISCHARGE DIAGNOSIS:  Extraction of cataract, left eye. Dictated by:   Marya Landry Carlyle Lipa., M.D. Attending Physician:  Karenann Cai DD:  03/11/02 TD:  03/12/02 Job: [id number]

## 2011-04-18 NOTE — H&P (Signed)
Kristi [name]. [facility]  Patient:    Kristi [name], Kristi [name]                   MRN: [id number] Adm. Date:  [id number] Attending:  Mervin [name] Dictator:   Kristi [name], P.A.                         History and Physical  PRIMARY CARE Kristi [name]:  [facility].  The patient does not know the name of her doctor there.  CHIEF COMPLAINT:  Upper abdominal pain with nausea and vomiting.  HISTORY OF PRESENT ILLNESS:  Kristi [name] is a 77 year old African-American female who began having upper abdominal pain on Wednesday evening.  This was associated with non coffee ground or hematology containing nausea and vomiting.  She took some Tylenol, Advil and applied a heating pad to the area and ultimately the pain subsided.  The pain recurred on Thursday evening and it was refractory to any medication.  She proceeded to the emergency room. Kristi [name] saw her as an unassigned patient.  The patients CT scan showed multiple stones within a dilated common bile duct.  The gallbladder appeared to be free of gallstones.  LABORATORY DATA:  Revealed a white cell count of 17.6, no anemia.  Lipase was 24.  transaminases were in the 200s, total bilirubin was 4.9 and alkaline phosphatase was within normal limits.  The potassium, however, was low at 2.9. Kristi [name] admitted the patient for further care and planned for ERCP with common bile duct stone removal within the next 24 hours.  PAST MEDICAL HISTORY: 1. The patient has a history of coronary disease beginning in the 1990s.  She    had a PTCA by Kristi [name] in 1991, this was to the LAD.  She is status post    repeat cardiac catheterization in April of 1995 by Kristi [name].   She had a    non flow limiting lesion at the site of her previous PTCA and a minimal    lesion of the left main. 2. History of costochondritis. 3. History of sinusitis. 4. Hypertension for which he is currently on medication. 5.  Hypercholesterolemia. 6. Degenerative joint disease especially in the right shoulder.  ALLERGIES:  No known drug allergies.  CURRENT MEDICATIONS:  The patient did not bring her medications and she is pretty limited in her ability to recall the names of any of the drugs she is taking, thus the list is a bit vague at this point.  1. She takes arthritis Tylenol 1 p.o. b.i.d. 2. Motrin 1 p.o. b.i.d. 3. Toprol XL.  We think the dose is 120 mg daily. 4. She is on a "cholesterol pill." 5. She is some other kind of a drug that she gets through some sort of study    she is involved in. 6. Coated aspirin 325 mg daily.  SOCIAL HISTORY:  The patient works as a Aeronautical engineer houses.  She has 8 children most of whom are in the [place] area.  She quit smoking 20 years ago and she does not consume alcoholic beverages.  The patient is single, she lives alone, and has a supportive family.  FAMILY HISTORY:  Her father died during a fire.  Her mother had a history of pneumonia and died with jaundice when the patient was 46 months old.  She has 2 brothers with heart disease.  She has a daughter with hypertension.  She has a brother who has a history of kidney stones and another brother with a history of stroke.  REVIEW OF SYSTEMS:  Neurologic:  No paresthesias, no visual changes, no blurry vision.  She is able to walk in a balanced way without falling and without canes or walkers.  Pulmonary:  She does get short of breath but has no active cough.  Cardiac:  Occasional pedal edema.  No current chest pain, no palpitations.  Urinalysis:  Yesterday her urine was very yellow compared with normal but today it is back to normal.  No dysuria.  GI:  She says she has a little bit of trouble swallowing and is on some sort of medication for this. Again, she does not know the name of the medication.  Currently she is complaining of a dry mouth.  Bowel movements are generally once to twice a day.   There has been no blood per rectum or melena noted.  Musculoskeletal: She has persistent pain in the right shoulder which is why she takes the Tylenol and Motrin.  PHYSICAL EXAMINATION:  VITAL SIGNS:  The patients temperature is 98.5, respirations are 22, blood pressure 126/67, and pulse is 68.  GENERAL:  The patient is a obese African-American female.  She is cooperative, comfortable, and sleeping initially but arouses easily.  HEENT:  The sclerae are mildly icteric.  Conjunctivae are pink.  Extraocular movements intact.  NECK:  There is no JVD, no bruits.  No thyromegaly.  No masses.  LUNGS:  Clear to auscultation and percussion bilaterally.  CORONARY:  There is a regular rate and rhythm with a very soft 1/6 systolic murmur at the left upper sternal border.  ABDOMEN:  Soft, with mild to moderate tenderness at the right upper quadrant. There is no guarding or rebound associated with palpation.  No hepatosplenomegaly or masses.  Bowel sounds are hypoactive.  There is no costovertebral angle tenderness.  RECTAL EXAM:  Notable for stool being negative for fecal occult blood.  No masses, and rectal tone within normal limits.  EXTREMITIES:  There is no clubbing, cyanosis, or edema.  NEUROLOGIC:  Despite the patients poor recall of her medical history she is not confused.  She is a little bit lethargic owing to recent administration of IV analgesics, however, she is alert and oriented x 3.  MUSCULOSKELETAL:  Joints of the fingers and knees show no arthritic distortion.  There is no tenderness to palpation in the neck or shoulders.  LABORATORY DATA:  PTT is 25, PT is 12.5, INR is 1.0.  CK-MB is 1.8, lipase is 24.  BUN is 16, creatinine 1.1, sodium 138, and potassium is low at 2.9. Chloride 100, carbon dioxide 28.  Total bilirubin is 4.9, alk. phos. 114, AST 243, ALT 288, and albumin is 3.4 with a calcium of 9.  CT scan as above showing dilated common bile duct with prominent  intrahepatic radicals.  There are multiple stones within the common duct, pancreas is normal and no obvious stones within the gallbladder.  IMPRESSION:  1. Choledocholithiasis. 2. Biliary colick. 3. Question early cholangitis.  The patients well cell count is elevated. 4. Coronary artery disease.  The EKG is showing right bundle branch block on    the EKG but no acute changes.  She did have a PTCA more than years ago.    CK is within normal limits. 5. Hypokalemia.  PLAN:  I plan to get an ultrasound of the abdomen.  Supplement the potassium. Start the patient on  IV Unasyn.  Set up ERCP with extraction of stones and sphincterotomy tomorrow.  Will get surgical consultation for evaluation of cholecystectomy.  The patient will probably require a preoperative cardiac evaluation as it seems to be some years since she saw a cardiologist. DD:  01/29/01 TD:  01/30/01 Job: [id number]

## 2011-04-18 NOTE — Consult Note (Signed)
[place]. [facility]  Patient:    Kristi [name], Kristi [name]                   MRN: [id number] Proc. Date: 01/29/01 Adm. Date:  [id number] Attending:  Mervin Hack CC:         Venita Lick. Pleas Koch., M.D. [facility]  Dora M. Juanda Chance, M.D. [facility]   Consultation Report  REASON FOR CONSULTATION:  Thank you very much for asking me to see Ms. Rief, a very pleasant 77 year old woman who currently, at the time of initial consultation, was in the endoscopic retrograde cholangiopancreatogram suite in x-ray getting an ERCP done by Dr. Claudette Head.  She had come in with cholangitis and evidence of choledocholithiasis and he had already retrieved five stones from her common duct and performed a large sphincterotomy.  I was coming to see her for an interval appendectomy.  She otherwise has been healthy with the exception of coronary artery disease for which she has had a PTCA, and hypertension.  She takes Toprol and aspirin and otherwise is fine. Her white count on admission was 17.6, her hematocrit is normal.  Creatinine is 1.1.  PHYSICAL EXAMINATION:  The patient is currently sedated.  I cannot get a good evaluation.  She had some mild abdominal discomfort.  She was jaundiced.  LABORATORY STUDIES:  Demonstrated a total bili of over 4 and SGOT and SGPT are elevated.  She had a dilated common bile duct on ERCP.  IMPRESSION:  Choledocholithiasis and cholelithiasis in an otherwise healthy woman with coronary artery disease, with pain.  PLAN:  Perform an interval laparoscopic cholecystectomy.  This would be done during this admission.  The patient will remain on IV antibiotics until the procedure is performed, hopefully in 24-48 hours. DD:  01/31/01 TD:  02/01/01 Job: [id number]

## 2011-08-12 ENCOUNTER — Emergency Department (HOSPITAL_COMMUNITY): Payer: No Typology Code available for payment source

## 2011-08-12 ENCOUNTER — Inpatient Hospital Stay (HOSPITAL_COMMUNITY)
Admission: EM | Admit: 2011-08-12 | Discharge: 2011-08-19 | DRG: 308 | Disposition: A | Discharge status: Home Health | Payer: No Typology Code available for payment source | Source: Ambulatory Visit | Attending: Cardiology | Admitting: Cardiology

## 2011-08-12 DIAGNOSIS — I2510 Atherosclerotic heart disease of native coronary artery without angina pectoris: Secondary | ICD-10-CM | POA: Diagnosis present

## 2011-08-12 DIAGNOSIS — I129 Hypertensive chronic kidney disease with stage 1 through stage 4 chronic kidney disease, or unspecified chronic kidney disease: Secondary | ICD-10-CM | POA: Diagnosis present

## 2011-08-12 DIAGNOSIS — M79609 Pain in unspecified limb: Secondary | ICD-10-CM

## 2011-08-12 DIAGNOSIS — Z9861 Coronary angioplasty status: Secondary | ICD-10-CM

## 2011-08-12 DIAGNOSIS — D509 Iron deficiency anemia, unspecified: Secondary | ICD-10-CM | POA: Diagnosis present

## 2011-08-12 DIAGNOSIS — N184 Chronic kidney disease, stage 4 (severe): Secondary | ICD-10-CM | POA: Diagnosis present

## 2011-08-12 DIAGNOSIS — R609 Edema, unspecified: Secondary | ICD-10-CM | POA: Diagnosis present

## 2011-08-12 DIAGNOSIS — N179 Acute kidney failure, unspecified: Secondary | ICD-10-CM | POA: Diagnosis present

## 2011-08-12 DIAGNOSIS — I509 Heart failure, unspecified: Secondary | ICD-10-CM | POA: Diagnosis present

## 2011-08-12 DIAGNOSIS — I4891 Unspecified atrial fibrillation: Secondary | ICD-10-CM

## 2011-08-12 DIAGNOSIS — Z7982 Long term (current) use of aspirin: Secondary | ICD-10-CM

## 2011-08-12 DIAGNOSIS — Z9119 Patient's noncompliance with other medical treatment and regimen: Secondary | ICD-10-CM

## 2011-08-12 DIAGNOSIS — I5041 Acute combined systolic (congestive) and diastolic (congestive) heart failure: Secondary | ICD-10-CM | POA: Diagnosis present

## 2011-08-12 DIAGNOSIS — I079 Rheumatic tricuspid valve disease, unspecified: Secondary | ICD-10-CM | POA: Diagnosis present

## 2011-08-12 DIAGNOSIS — E785 Hyperlipidemia, unspecified: Secondary | ICD-10-CM | POA: Diagnosis present

## 2011-08-12 DIAGNOSIS — F0390 Unspecified dementia without behavioral disturbance: Secondary | ICD-10-CM | POA: Diagnosis present

## 2011-08-12 DIAGNOSIS — M7989 Other specified soft tissue disorders: Secondary | ICD-10-CM

## 2011-08-12 DIAGNOSIS — M1990 Unspecified osteoarthritis, unspecified site: Secondary | ICD-10-CM | POA: Diagnosis present

## 2011-08-12 DIAGNOSIS — I059 Rheumatic mitral valve disease, unspecified: Secondary | ICD-10-CM | POA: Diagnosis present

## 2011-08-12 DIAGNOSIS — Z91199 Patient's noncompliance with other medical treatment and regimen due to unspecified reason: Secondary | ICD-10-CM

## 2011-08-12 LAB — CBC
MCH: 30.2 pg (ref 26.0–34.0)
Platelets: 264 10*3/uL (ref 150–400)
RDW: 15.1 % (ref 11.5–15.5)

## 2011-08-12 LAB — CK TOTAL AND CKMB (NOT AT ARMC)
Relative Index: 3.7 — ABNORMAL HIGH (ref 0.0–2.5)
Total CK: 145 U/L (ref 7–177)

## 2011-08-12 LAB — BASIC METABOLIC PANEL
GFR calc Af Amer: 42 mL/min — ABNORMAL LOW (ref >60)
Potassium: 3.6 mEq/L (ref 3.5–5.1)
Sodium: 143 mEq/L (ref 135–145)

## 2011-08-12 LAB — DIFFERENTIAL
Basophils Absolute: 0 10*3/uL (ref 0.0–0.1)
Eosinophils Relative: 3 % (ref 0–5)
Lymphocytes Relative: 19 % (ref 12–46)
Monocytes Absolute: 0.7 10*3/uL (ref 0.1–1.0)
Monocytes Relative: 8 % (ref 3–12)

## 2011-08-12 LAB — PRO B NATRIURETIC PEPTIDE: Pro B Natriuretic peptide (BNP): 26470 pg/mL — ABNORMAL HIGH (ref 0–450)

## 2011-08-12 LAB — CARDIAC PANEL(CRET KIN+CKTOT+MB+TROPI): Relative Index: 3.5 — ABNORMAL HIGH (ref 0.0–2.5)

## 2011-08-12 LAB — POCT I-STAT TROPONIN I: Troponin i, poc: 0.08 ng/mL (ref 0.00–0.08)

## 2011-08-12 LAB — OCCULT BLOOD X 1 CARD TO LAB, STOOL: Fecal Occult Bld: NEGATIVE

## 2011-08-12 LAB — TSH: TSH: 2.803 u[IU]/mL (ref 0.350–4.500)

## 2011-08-12 LAB — D-DIMER, QUANTITATIVE: D-Dimer, Quant: 0.94 ug/mL-FEU — ABNORMAL HIGH (ref 0.00–0.48)

## 2011-08-12 LAB — T3, FREE: T3, Free: 2.5 pg/mL (ref 2.3–4.2)

## 2011-08-13 ENCOUNTER — Inpatient Hospital Stay (HOSPITAL_COMMUNITY): Payer: No Typology Code available for payment source

## 2011-08-13 DIAGNOSIS — I059 Rheumatic mitral valve disease, unspecified: Secondary | ICD-10-CM

## 2011-08-13 LAB — OCCULT BLOOD X 1 CARD TO LAB, STOOL: Fecal Occult Bld: NEGATIVE

## 2011-08-13 LAB — COMPREHENSIVE METABOLIC PANEL
Alkaline Phosphatase: 63 U/L (ref 39–117)
BUN: 20 mg/dL (ref 6–23)
Chloride: 107 mEq/L (ref 96–112)
Creatinine, Ser: 1.63 mg/dL — ABNORMAL HIGH (ref 0.50–1.10)
GFR calc Af Amer: 37 mL/min — ABNORMAL LOW (ref >60)
Glucose, Bld: 98 mg/dL (ref 70–99)
Potassium: 3.1 mEq/L — ABNORMAL LOW (ref 3.5–5.1)
Total Bilirubin: 0.2 mg/dL — ABNORMAL LOW (ref 0.3–1.2)
Total Protein: 5.8 g/dL — ABNORMAL LOW (ref 6.0–8.3)

## 2011-08-13 LAB — CARDIAC PANEL(CRET KIN+CKTOT+MB+TROPI): CK, MB: 5.4 ng/mL — ABNORMAL HIGH (ref 0.3–4.0)

## 2011-08-13 LAB — PROTIME-INR
INR: 1.14 (ref 0.00–1.49)
Prothrombin Time: 14.8 seconds (ref 11.6–15.2)

## 2011-08-13 LAB — FERRITIN: Ferritin: 53 ng/mL (ref 10–291)

## 2011-08-13 LAB — LIPID PANEL
Cholesterol: 161 mg/dL (ref 0–200)
Triglycerides: 61 mg/dL (ref <150)

## 2011-08-13 LAB — HEPARIN LEVEL (UNFRACTIONATED)
Heparin Unfractionated: 0.25 IU/mL — ABNORMAL LOW (ref 0.30–0.70)
Heparin Unfractionated: 0.31 IU/mL (ref 0.30–0.70)

## 2011-08-14 LAB — BASIC METABOLIC PANEL
CO2: 26 mEq/L (ref 19–32)
Chloride: 105 mEq/L (ref 96–112)
Glucose, Bld: 176 mg/dL — ABNORMAL HIGH (ref 70–99)
Potassium: 3.9 mEq/L (ref 3.5–5.1)
Sodium: 139 mEq/L (ref 135–145)

## 2011-08-14 LAB — CBC
Hemoglobin: 9.1 g/dL — ABNORMAL LOW (ref 12.0–15.0)
RBC: 3 MIL/uL — ABNORMAL LOW (ref 3.87–5.11)

## 2011-08-16 LAB — IRON AND TIBC
Iron: 24 ug/dL — ABNORMAL LOW (ref 42–135)
Saturation Ratios: 9 % — ABNORMAL LOW (ref 20–55)
TIBC: 267 ug/dL (ref 250–470)
UIBC: 243 ug/dL (ref 125–400)

## 2011-08-16 LAB — VITAMIN B12: Vitamin B-12: 297 pg/mL (ref 211–911)

## 2011-08-17 LAB — BASIC METABOLIC PANEL
BUN: 31 mg/dL — ABNORMAL HIGH (ref 6–23)
Calcium: 9.6 mg/dL (ref 8.4–10.5)
GFR calc non Af Amer: 28 mL/min — ABNORMAL LOW (ref >60)
Glucose, Bld: 99 mg/dL (ref 70–99)
Potassium: 3.7 mEq/L (ref 3.5–5.1)

## 2011-08-18 LAB — BASIC METABOLIC PANEL
BUN: 36 mg/dL — ABNORMAL HIGH (ref 6–23)
GFR calc non Af Amer: 25 mL/min — ABNORMAL LOW (ref >60)
Glucose, Bld: 98 mg/dL (ref 70–99)
Potassium: 4 mEq/L (ref 3.5–5.1)

## 2011-08-19 LAB — BASIC METABOLIC PANEL
CO2: 27 mEq/L (ref 19–32)
GFR calc non Af Amer: 29 mL/min — ABNORMAL LOW (ref >60)
Glucose, Bld: 89 mg/dL (ref 70–99)
Potassium: 3.7 mEq/L (ref 3.5–5.1)
Sodium: 138 mEq/L (ref 135–145)

## 2011-08-19 LAB — CBC
HCT: 28.4 % — ABNORMAL LOW (ref 36.0–46.0)
Hemoglobin: 9.4 g/dL — ABNORMAL LOW (ref 12.0–15.0)
MCH: 30.7 pg (ref 26.0–34.0)
MCHC: 33.1 g/dL (ref 30.0–36.0)

## 2011-08-22 ENCOUNTER — Telehealth: Payer: Self-pay | Admitting: *Deleted

## 2011-08-22 NOTE — Discharge Summary (Signed)
Kristi [name], Kristi [name] NO.:  [id number]  MEDICAL RECORD NO.:  [id number]  LOCATION:  3709                         FACILITY:  [facility]  PHYSICIAN:  Cassell Clement, M.D. DATE OF BIRTH:  August 15, 1931  DATE OF ADMISSION:  08/12/2011 DATE OF DISCHARGE:  08/18/2011                              DISCHARGE SUMMARY   PRIMARY CARDIOLOGIST:  Luis Abed, MD, [facility]  PRIMARY CARE APPROXIMATELY:  Merlene Laughter. Renae Gloss, MD  DISCHARGE DIAGNOSIS:  Paroxysmal atrial fibrillation with rapid ventricular response.  SECONDARY DIAGNOSES: 1. 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 Moderate mitral and tricuspid regurgitation. 3. History of noncompliance. 4. Hypertension. 5. Hyperlipidemia. 6. Mild dementia. 7. Coronary artery disease, status post left anterior descending     intervention in the early 1990s. 8. History of degenerative joint disease. 9. History of chest wall pain/costochondritis. 10.Status post cataract surgery. 11.Acute on chronic stage IV kidney disease. 12.Left greater than right lower extremity edema.  ALLERGIES:  No known drug allergies.  PROCEDURES: 1. Two-D echocardiogram performed on August 13, 2011, showing an EF     of 40-45% with moderate to severe LVH.  Mild aortic insufficiency     with moderate tricuspid mitral regurgitation.  Mildly dilated right     ventricle with normal systolic function.  PASP 46 mmHg. 2. Bilateral lower extremity ultrasound on August 12, 2011, showing     no evidence of DVT or superficial thrombosis involving the     bilateral lower extremities.  HISTORY OF PRESENT ILLNESS:  This is a 77 year old female with the above problem list who was in her usual state of health until weeks prior to admission when she began to complain of leg pain.  Her family took her to her primary care provider's office on August 12, 2011, where she has noted to be in AFib with  RVR and rates in 140s and also had evidence of volume overload.  She was transferred to the [facility] ED and Cardiology was asked to evaluate her.  She was placed on IV diltiazem and infusion with improvement in rate control.  In the ED, her pro-BNP was noted to be markedly elevated at 26,470 and she was felt to have mild volume overload on exam.  She was admitted for further evaluation and management of the atrial fibrillation.  HOSPITAL COURSE:  The patient ruled out for MI and by August 13, 2011, converted to sinus rhythm allowing for switching from IV to oral diltiazem as well as oral beta-blocker therapy.  She was initially placed on IV Lasix given mild volume overload and following symptomatic improvement she was switched to oral Lasix beginning on August 14, 2011.  Unfortunately, the patient was noted to have rising creatinine in the setting of ongoing Lasix therapy and Lasix was switched to hydrochlorothiazide on August 16, 2011, however, because of continuation of rising creatinine all diuretics have been discontinued.  In the setting of complaints of leg pain with left greater than right ankle edema noted on examination, D-dimer was evaluated and was found to be elevated at 0.94.  A bilateral lower extremity ultrasound was performed showing no evidence of  DVT or superficial thrombosis.  It was felt that the patient's clinical picture did not warrant further investigation into pulmonary embolus and thus a CT of her chest was not performed.  The patient had apparently been treated for hypertension in the past, but reported that she had not been taking her medicines since at least the May 2012.  In the setting of AFib, she was placed on beta-blocker as well as calcium channel blocker therapy and because pressures continued to run high clonidine patch was added and on top of that amlodipine therapy was added as well.  We chose amlodipine as we could not titrate her  diltiazem further (oral metoprolol) because of intermittent bradycardia with rates sometimes dipping into the 40s at night.  With addition of multiple medications as outlined, we have achieved improved blood pressure control, however, she will likely require additional titration in the outpatient setting.  The patient's family reported worsening dementia and felt that the patient was not able to live by herself.  Further, the patient did not feel that they could provide care for the patient.  Occupational Therapy was consulted as well as Physical Therapy and recommended 24-hour supervision or assisted living/primary care unit if supervision not available.  With this information, Social Work became involved and family was agreeable to allow for skilled nursing facility placement. The patient has a bed offer from [facility] and [address] and family is agreeable to have the patient discharged there today.  As the patient's creatinine has risen throughout admission and is 1.96 today. We have requested that a basic metabolic panel be drawn on August 20, 2011.  The patient also has evidence of normocytic/normochromic anemia and they have provided a prescription for repeat CBC as well.  The patient will follow up Dr. Willa Rough in our office in approximately 2 weeks and will continue to follow up with primary care provider.  She will be discharged to skilled nursing facility today in good condition.  Decision was made not to pursue Coumadin anticoagulation in the patient secondary to dementia.  If the patient remains in the skilled nursing facility going forward, we can readdress this.  DISCHARGE LABS:  Hemoglobin 9.1, hematocrit 27.7, WBC 7.9, and platelets 266.  INR 1.14.  Sodium 138, potassium 4.2, chloride 102, CO2 of 29, BUN 36, creatinine 1.96, and glucose 98.  Total bilirubin 0.2, alkaline phosphatase 63, AST 19, ALT 18, total protein 5.8, albumin 2.8, calcium 9.2, magnesium  2.0.  Hemoglobin A1c 5.6.  CK 148, MB 5.4, and troponin I less than 0.30.  BNP 26,470.  Total cholesterol 161, triglycerides 81, HDL 35, and LDL 114.  Free T4 1.37, TSH 2.803, and free T3 of 2.5. Serum iron 24, TIBC 267, B12 of 297, folate 8.3, and ferritin 106. Fecal occult blood was negative.  MRSA screen was negative.  DISPOSITION:  The patient will be discharged to skilled nursing facility today in good condition.  FOLLOWUP PLANS AND APPOINTMENTS:  The patient will need CBC and BMET drawn at the skilled nursing facility on August 20, 2011.  Results can be faxed to Dr. Willa Rough.  The patient will follow up with Dr. Renae Gloss as previously scheduled and is to follow up with Dr. Myrtis Ser on September 30, 2011, at 11:30 a.m.  DISCHARGE MEDICATIONS: 1. Amlodipine 5 mg daily. 2. Aspirin 81 mg daily. 3. Clonidine 0.2 mg patch q. Week. 4. Diltiazem CD 180 mg daily. 5. Ferrous sulfate 325 mg daily. 6. Toprol-XL 50 mg daily. 7. Multivitamin 1  tablet daily.  OUTSTANDING LAB AND STUDIES:  Followup CBC and BMET on August 20, 2011.  Duration of discharge encounter 60 minutes including physician time.     Nicolasa Ducking, ANP   ______________________________ Cassell Clement, M.D.    CB/MEDQ  D:  08/18/2011  T:  08/18/2011  Job:  [id number]  cc:   Merlene Laughter. Renae Gloss, M.D. [facility]  Electronically Signed by Nicolasa Ducking ANP on 08/21/2011 03:52:42 PM Electronically Signed by Cassell Clement M.D. on 08/22/2011 12:54:45 PM

## 2011-08-22 NOTE — Telephone Encounter (Signed)
9/21--"velda"--from [facility] calling with stat lab results for ms Slight--results given to dr Myrtis Ser to review--nt

## 2011-08-25 ENCOUNTER — Telehealth: Payer: Self-pay | Admitting: Cardiology

## 2011-08-25 NOTE — Telephone Encounter (Signed)
SPOKE WITH JAQUIE FROM [facility]   HAVE PT  ELEVATE LEGS AS MUCH AS POSSIBLE  PER DR KATZ AND KEEP F/U APPT  FOR 09-03-11  ALSO SEE OTHER PHONE NOTE ADDRESSED EARLIER TODAY ./CY

## 2011-08-25 NOTE — Telephone Encounter (Signed)
Returning your call. °

## 2011-08-25 NOTE — Telephone Encounter (Signed)
LMTCB--per dr Craig Guess refer ms Neary's daughter to [facility] m[facility] For c/o feet swelling--nt

## 2011-08-25 NOTE — Telephone Encounter (Signed)
Spoke with Annice Pih, Home Health RN.  She is asking what to do about amlodipine, toprol and cardizem as pt accidentally took extra dose today.  I confirmed with Annice Pih pt is not due another dose of these meds today. I told her pt should check heart rate and blood pressure tomorrow and if back to normal she can resume these medications. If blood pressure and heart rate low she should resume them on Wednesday.

## 2011-08-25 NOTE — Telephone Encounter (Signed)
9/24--11:45 am--attempted to call ms sutton back--no answer--LM stating dr Myrtis Ser would like pt to call kimberly [facility],PCP, with c/o swellin in feet--nt

## 2011-08-25 NOTE — Telephone Encounter (Signed)
WILL DISCUSS WITH DR KATZ./CY

## 2011-08-25 NOTE — Telephone Encounter (Signed)
Pt's daughter called mother was released from hospital last Tuesday now her feet is swollen please call

## 2011-08-25 NOTE — Telephone Encounter (Signed)
Pt has edema in lower extremities and they wanted to start her on a durectic. Pt took a double dose of amilonipine, toprolol and a diliazem and her HR 40's. She also has a klonidine patch.

## 2011-08-25 NOTE — Telephone Encounter (Signed)
Kristi [name] calling back to speak w/ Wynona Canes. Please call back.

## 2011-09-01 ENCOUNTER — Encounter: Payer: Self-pay | Admitting: Cardiology

## 2011-09-01 DIAGNOSIS — I10 Essential (primary) hypertension: Secondary | ICD-10-CM | POA: Insufficient documentation

## 2011-09-01 DIAGNOSIS — R9430 Abnormal result of cardiovascular function study, unspecified: Secondary | ICD-10-CM | POA: Insufficient documentation

## 2011-09-01 DIAGNOSIS — Z9119 Patient's noncompliance with other medical treatment and regimen: Secondary | ICD-10-CM | POA: Insufficient documentation

## 2011-09-01 DIAGNOSIS — I272 Pulmonary hypertension, unspecified: Secondary | ICD-10-CM | POA: Insufficient documentation

## 2011-09-01 DIAGNOSIS — I340 Nonrheumatic mitral (valve) insufficiency: Secondary | ICD-10-CM | POA: Insufficient documentation

## 2011-09-01 DIAGNOSIS — R609 Edema, unspecified: Secondary | ICD-10-CM | POA: Insufficient documentation

## 2011-09-01 DIAGNOSIS — I351 Nonrheumatic aortic (valve) insufficiency: Secondary | ICD-10-CM | POA: Insufficient documentation

## 2011-09-01 DIAGNOSIS — F0390 Unspecified dementia without behavioral disturbance: Secondary | ICD-10-CM | POA: Insufficient documentation

## 2011-09-01 DIAGNOSIS — I2510 Atherosclerotic heart disease of native coronary artery without angina pectoris: Secondary | ICD-10-CM | POA: Insufficient documentation

## 2011-09-01 DIAGNOSIS — N184 Chronic kidney disease, stage 4 (severe): Secondary | ICD-10-CM | POA: Insufficient documentation

## 2011-09-01 DIAGNOSIS — I4891 Unspecified atrial fibrillation: Secondary | ICD-10-CM | POA: Insufficient documentation

## 2011-09-01 DIAGNOSIS — R079 Chest pain, unspecified: Secondary | ICD-10-CM | POA: Insufficient documentation

## 2011-09-01 DIAGNOSIS — E785 Hyperlipidemia, unspecified: Secondary | ICD-10-CM | POA: Insufficient documentation

## 2011-09-01 DIAGNOSIS — I509 Heart failure, unspecified: Secondary | ICD-10-CM | POA: Insufficient documentation

## 2011-09-03 ENCOUNTER — Encounter: Payer: Self-pay | Admitting: Cardiology

## 2011-09-03 ENCOUNTER — Ambulatory Visit (INDEPENDENT_AMBULATORY_CARE_PROVIDER_SITE_OTHER): Payer: Medicare Other | Admitting: Cardiology

## 2011-09-03 DIAGNOSIS — R5383 Other fatigue: Secondary | ICD-10-CM | POA: Insufficient documentation

## 2011-09-03 DIAGNOSIS — I4891 Unspecified atrial fibrillation: Secondary | ICD-10-CM

## 2011-09-03 DIAGNOSIS — R609 Edema, unspecified: Secondary | ICD-10-CM

## 2011-09-03 DIAGNOSIS — I10 Essential (primary) hypertension: Secondary | ICD-10-CM

## 2011-09-03 DIAGNOSIS — R001 Bradycardia, unspecified: Secondary | ICD-10-CM

## 2011-09-03 DIAGNOSIS — I498 Other specified cardiac arrhythmias: Secondary | ICD-10-CM

## 2011-09-03 DIAGNOSIS — I509 Heart failure, unspecified: Secondary | ICD-10-CM

## 2011-09-03 MED ORDER — METOPROLOL SUCCINATE ER 25 MG PO TB24: ORAL_TABLET | ORAL | Status: DC

## 2011-09-03 NOTE — Assessment & Plan Note (Signed)
Currently the patient is bothered by fatigue.  This may be related to her beta blocker.  She has significant bradycardia.  I will reduce her beta blocker.

## 2011-09-03 NOTE — Assessment & Plan Note (Signed)
Beta blocker is to be reduced to help with her bradycardia.

## 2011-09-03 NOTE — Assessment & Plan Note (Signed)
Patient had atrial fibrillation in the hospital.  Fortunately she is holding sinus rhythm.  She is not a Coumadin candidate.

## 2011-09-03 NOTE — Assessment & Plan Note (Signed)
Systolic blood pressure is mildly elevated today.  I am not inclined to change her medications.

## 2011-09-03 NOTE — Assessment & Plan Note (Signed)
When the patient was hospitalized with atrial fib she had volume overload.  She was diureses.  She then had some renal dysfunction and her diuretics were reduced.  At home she had some swelling according to her family.  I was very hesitant to restart diuretics.  She has been elevating her feet and she is actually doing well.  No change in medicines at this time.

## 2011-09-03 NOTE — Progress Notes (Signed)
HPI The patient is seen after recent hospitalization.  I met her for the first time in the hospital in September, 2012.  She presented with atrial fibrillation and volume overload.  Her atrial fibrillation converted to sinus spontaneously.  Her volume status was treated at her renal function became worse.  We had to reduce her diuretics. Her ejection fraction was 40-45%.  At that time also her living situation was difficult in that she was living alone.  Her family was trying to arrange further plans.  As part of today's evaluation I have reviewed the hospital records including the discharge summary cardiology consultation and the labs.  The patient's daughters come to her house to give her her medicines.  I believe this is helping the situation as the patient has a memory problem.  The patient's does have ongoing fatigue.  She's not feeling any significant palpitations.  Allergies  Allergen Reactions  . Atenolol   . Ramipril     REACTION: cough  . Simvastatin     Current Outpatient Prescriptions  Medication Sig Dispense Refill  . amLODipine (NORVASC) 10 MG tablet 1 tab daily      . aspirin 81 MG tablet Take 81 mg by mouth daily.        . cloNIDine (CATAPRES - DOSED IN MG/24 HR) 0.2 mg/24hr patch Place 1 patch onto the skin once a week.        . diltiazem (CARDIZEM CD) 180 MG 24 hr capsule Take 180 mg by mouth daily.        . ferrous sulfate 325 (65 FE) MG tablet Take 325 mg by mouth daily with breakfast.        . metoprolol (TOPROL-XL) 50 MG 24 hr tablet Take 50 mg by mouth daily.        . Multiple Vitamin (MULTIVITAMIN) tablet Take 1 tablet by mouth daily.          History   Social History  . Marital Status: Widowed    Spouse Name: N/A    Number of Children: N/A  . Years of Education: N/A   Occupational History  . retired    Social History Main Topics  . Smoking status: Former Games developer  . Smokeless tobacco: Not on file  . Alcohol Use: No  . Drug Use: No  . Sexually Active: Not  on file   Other Topics Concern  . Not on file   Social History Narrative  . No narrative on file    Family History  Problem Relation Age of Onset  . Pneumonia Mother     died  . Other Father     died on a fire  . Coronary artery disease Brother     2 brothers    Past Medical History  Diagnosis Date  . Atrial fibrillation     Rapid response, hospital, September, 2012  . CHF (congestive heart failure)     Combined systolic and diastolic, EF 40-45%, 2012  . Ejection fraction     EF 40-45%, echo, September, 2012  . History of noncompliance with medical treatment   . Hypertension   . Dyslipidemia   . Dementia      ? mild ?  . CAD (coronary artery disease)     PCI to LAD early 1990s  . Degenerative joint disease   . Chest pain     Chest wall pain and costochondritis  . CKD (chronic kidney disease), stage IV   . Edema     Left  greater than right lower extremity, No DVT by Doppler, September, 2012  . Aortic insufficiency     Mild, echo, September, 2012  . Mitral regurgitation     Moderate, echo, 2012  . Pulmonary hypertension     46 mmHg, echo, 2012  . Hyperlipidemia     Past Surgical History  Procedure Date  . Cardiac catheterization   . Cataract extraction     ROS  Patient denies fever, chills, headache, sweats, rash, change in vision, change in hearing, chest pain, cough, nausea vomiting, urinary symptoms.  All other systems are reviewed and are negative other than the history of present illness PHYSICAL EXAM Patient is stable today.  She is oriented to person time and place.  She is wearing her had as always.  There is no jugular venous extension.  Lungs are clear.  Respiratory effort is nonlabored.  Cardiac exam reveals S1-S2.  There is a soft systolic murmur.  The abdomen is soft.  There is no significant peripheral edema.  There is no musculoskeletal deformities.  There no skin rashes. Filed Vitals:   09/03/11 1146  BP: 157/79  Pulse: 45  Height: 5\' 1"   (1.549 m)  Weight: 103 lb (46.72 kg)    EKG done today and reviewed by me.  She has marked sinus bradycardia. There is incomplete right bundle branch block.  There is marked increase in voltage and diffuse ST-T wave changes.  This is unchanged from the hospital. ASSESSMENT & PLAN

## 2011-09-03 NOTE — Assessment & Plan Note (Signed)
Currently she does not have significant edema.  No change in therapy.

## 2011-09-03 NOTE — Patient Instructions (Signed)
Your physician wants you to follow-up in: 3 months You will receive a reminder letter in the mail two months in advance. If you don't receive a letter, please call our office to schedule the follow-up appointment.  Your physician has recommended you make the following change in your medication:  1) change/ reduce  toprol xl 50 mg to 25 mg daily due to slow heart rate.

## 2011-09-15 NOTE — H&P (Signed)
NAMEPAISLY, FINGERHUT NO.:  [id number]  MEDICAL RECORD NO.:  [id number]  LOCATION:  2922                         FACILITY:  [facility]  PHYSICIAN:  Cassell Clement, M.D. DATE OF BIRTH:  05/25/1931  DATE OF ADMISSION:  08/12/2011 DATE OF DISCHARGE:                             HISTORY & PHYSICAL   PRIMARY CARE PHYSICIAN:  Merlene Laughter. Renae Gloss, MD, at [facility].  PRIMARY CARDIOLOGIST:  Luis Abed, MD, [facility], in 2003.  CHIEF COMPLAINT:  AFib, RVR, and heart failure.  HISTORY OF PRESENT ILLNESS:  Ms. Dunne is a 77 year old female with a history of nonobstructive coronary artery disease.  The family describes increasing dyspnea on exertion and lower extremity edema.  The edema has been going on for two weeks, the dyspnea on exertion for much longer. She has also lost weight.  Her memory is very poor.  Today, the patient was complaining of leg pain which has been ongoing for a week or two. The patient initially refused to go to the doctor, but the family stated that they were able to talk her into going.  In the physician's office, her heart rate was in the 140s and she was found to be in AFib, RVR. She was also volume overloaded.  She was transferred to the emergency room.  Cardiology was asked to evaluate her.  Ms. Penado admits to a poor memory.  She has trouble naming her daughters and does not know the day, the week, or the month.  Her family states that she forgets she has taken her medication and will take more of it or forgets to take it completely and does not take it at all.  She is not currently on any medication for her high blood pressure or hyperlipidemia.  She denies any chest pain and the family does not recall her complaining of any chest pain.  She is currently on IV Cardizem at 5 mg per hour.  Initially, in the emergency room, she was in AFib, RVR but has since spontaneously converted to sinus rhythm after being  started on Cardizem.  Currently, she denies chest pain.  She has no obvious injury or source of pain from her leg.  She recognizes that her memory is poor.  PAST MEDICAL HISTORY: 1. Status post cardiac catheterization in 2003 showing LAD 40%,     diagonal 130%, circumflex patent, RCA 50%, PDA 20%, EF of 60%. 2. History of PTCA to either the LAD or diagonal in 1991 by Dr. Sylvie Farrier. 3. Hypertension. 4. Hyperlipidemia. 5. Poor memory. 6. Costochondritis/degenerative joint disease.  PAST SURGICAL HISTORY:  She is status post cardiac catheterization, as well as cataract surgery, shoulder injections and ERCP followed by lap chole.  ALLERGIES:  None known.  CURRENT MEDICATIONS:  None known.  SOCIAL HISTORY:  She lives in [place] alone.  She is a retired Engineer, manufacturing systems.  She quit tobacco greater than 25 years ago with approximately 20-pack-year history and denies alcohol or drug abuse.  She has family in the area including the two daughters that are with her today.  FAMILY HISTORY:  Her mother died with pneumonia when the patient was just a few months old, probably in her 30s.  Her father died in the fire later and had no history of coronary artery disease.  She does have heart disease in two brothers.  REVIEW OF SYSTEMS:  The patient complains of some arthralgias.  She admits to memory problems.  She essentially denies other symptoms but per the family, her activity has decreased because she gets short of breath and tired easily when she tries to do anything.  She has been swelling in her left lower extremity and has a history of anemia with no values given and they are not aware of any evaluation for this.  Per the computer, in 2008, her hemoglobin was 11.6 with a hematocrit of 33.3. Full 14-point review of systems is, otherwise, negative except as stated in the HPI per the patient.  PHYSICAL EXAMINATION:  VITAL SIGNS:  Temperature is 98.1; blood pressure 184/112; heart rate 138, now  69; respiratory rate 24; O2 saturation 97% on room air. GENERAL:  She is a slender elderly African American female, in no acute distress. HEENT:  Normal for age with the exception of poor dentition. NECK:  There is no lymphadenopathy, thyromegaly, or bruits noted and JVD is elevated at approximately 10 cm. CARDIOVASCULAR:  Her heart is slightly irregular in rate and rhythm with an S1, S2, and an S4 as well as a 2/6 systolic murmur.  Distal pulses are intact in all four extremities. LUNGS:  She has bibasilar crackles, left greater than right. SKIN:  No rashes or lesions are noted. ABDOMEN:  Soft and nontender with active bowel sounds. EXTREMITIES:  There is no cyanosis or clubbing noted but she has left lower extremity edema in her ankle and foot. MUSCULOSKELETAL:  There is no joint deformity or effusions and no spine or CVA tenderness.  She may have a gout in the past, but her left foot and ankle are nontender and mobile. NEUROLOGIC:  She is alert and oriented x1 with cranial nerves II through XII grossly intact.  Chest x-ray is pending.  EKG:  Initially AFib, RVR with ST and T-wave depression in the inferolateral leads that is different from an EKG dated 2003.  LABORATORY VALUES:  Hemoglobin 9.6, hematocrit 29.3, WBCs 8.3, platelets 264.  Sodium 143, potassium 3.6, chloride 108, CO2 is 27, BUN 18, creatinine 1.46, glucose 97.  CK and MB 145 and 5.3 with an index of 3.7, and troponin I of 0.08, BNP 26470.  Fecal occult blood negative x1.  IMPRESSION:  Ms. Drakeford was seen today by Dr. Patty Sermons, the patient was evaluated and the data was reviewed.  She is an elderly black female who presented to Dr. Mathews Robinsons office with hypertension and rapid atrial fibrillation.  She has a past history of high blood pressure, hyperlipidemia, and dementia but had run out of all of her meds.  The history was obtained from the family because the patient is severely demented.  The family states no  chest pain, no cough, and no sputum. She has lost weight but they are not sure how much.  On telemetry, now she is back in sinus rhythm and is on an intravenous Cardizem drip.  Her lungs revealed rales at the left base and her heart has an S4 but no S3 and a grade 2/6 apical murmur of mitral regurgitation as well as a soft basilar systolic ejection murmur.  Her abdomen is negative, and her extremities show localized edema on the left lower leg with left calf larger than the right.  The patient denies any leg pain.  Her  chest x- ray is pending.  An EKG shows atrial flutter with rapid ventricular response, right axis deviation, and an incomplete right bundle-branch block.  PLAN: 1. Admit to telemetry. 2. Check 2-D echo. 3. Check D-dimer and get venous Dopplers. 4. Check thyroid function tests. 5. Add a beta-blocker to her medication regimen. 6. Anticoagulation with IV heparin and baby aspirin. 7. Diurese with IV Lasix and follow renal functions closely. 8. Check a anemia profile and continue to guaiac stools.     Theodore Demark, PA-C   ______________________________ Cassell Clement, M.D.    RB/MEDQ  D:  08/12/2011  T:  08/13/2011  Job:  [id number]  Electronically Signed by Theodore Demark PA-C on 09/11/2011 06:35:54 AM Electronically Signed by Cassell Clement M.D. on 09/15/2011 08:36:32 PM

## 2011-12-17 ENCOUNTER — Emergency Department (HOSPITAL_COMMUNITY): Payer: Medicare PPO

## 2011-12-17 ENCOUNTER — Encounter (HOSPITAL_COMMUNITY): Payer: Self-pay

## 2011-12-17 ENCOUNTER — Other Ambulatory Visit: Payer: Self-pay

## 2011-12-17 ENCOUNTER — Inpatient Hospital Stay (HOSPITAL_COMMUNITY)
Admission: EM | Admit: 2011-12-17 | Discharge: 2011-12-23 | DRG: 057 | Disposition: A | Discharge status: Skilled Nursing Facility | Payer: Medicare PPO | Source: Ambulatory Visit | Attending: Internal Medicine | Admitting: Internal Medicine

## 2011-12-17 DIAGNOSIS — I498 Other specified cardiac arrhythmias: Secondary | ICD-10-CM | POA: Diagnosis present

## 2011-12-17 DIAGNOSIS — Z91199 Patient's noncompliance with other medical treatment and regimen due to unspecified reason: Secondary | ICD-10-CM

## 2011-12-17 DIAGNOSIS — F0280 Dementia in other diseases classified elsewhere without behavioral disturbance: Principal | ICD-10-CM | POA: Diagnosis present

## 2011-12-17 DIAGNOSIS — I5042 Chronic combined systolic (congestive) and diastolic (congestive) heart failure: Secondary | ICD-10-CM | POA: Diagnosis present

## 2011-12-17 DIAGNOSIS — I2510 Atherosclerotic heart disease of native coronary artery without angina pectoris: Secondary | ICD-10-CM | POA: Diagnosis present

## 2011-12-17 DIAGNOSIS — R001 Bradycardia, unspecified: Secondary | ICD-10-CM | POA: Diagnosis present

## 2011-12-17 DIAGNOSIS — N184 Chronic kidney disease, stage 4 (severe): Secondary | ICD-10-CM | POA: Diagnosis present

## 2011-12-17 DIAGNOSIS — R410 Disorientation, unspecified: Secondary | ICD-10-CM | POA: Diagnosis present

## 2011-12-17 DIAGNOSIS — R5383 Other fatigue: Secondary | ICD-10-CM

## 2011-12-17 DIAGNOSIS — F0390 Unspecified dementia without behavioral disturbance: Secondary | ICD-10-CM | POA: Diagnosis present

## 2011-12-17 DIAGNOSIS — Z9119 Patient's noncompliance with other medical treatment and regimen: Secondary | ICD-10-CM

## 2011-12-17 DIAGNOSIS — G309 Alzheimer's disease, unspecified: Principal | ICD-10-CM | POA: Diagnosis present

## 2011-12-17 DIAGNOSIS — M1990 Unspecified osteoarthritis, unspecified site: Secondary | ICD-10-CM | POA: Diagnosis present

## 2011-12-17 DIAGNOSIS — F02818 Dementia in other diseases classified elsewhere, unspecified severity, with other behavioral disturbance: Secondary | ICD-10-CM | POA: Diagnosis present

## 2011-12-17 DIAGNOSIS — Z9849 Cataract extraction status, unspecified eye: Secondary | ICD-10-CM

## 2011-12-17 DIAGNOSIS — E785 Hyperlipidemia, unspecified: Secondary | ICD-10-CM | POA: Diagnosis present

## 2011-12-17 DIAGNOSIS — I4891 Unspecified atrial fibrillation: Secondary | ICD-10-CM | POA: Diagnosis present

## 2011-12-17 DIAGNOSIS — D72829 Elevated white blood cell count, unspecified: Secondary | ICD-10-CM | POA: Diagnosis present

## 2011-12-17 DIAGNOSIS — Z9183 Wandering in diseases classified elsewhere: Secondary | ICD-10-CM | POA: Diagnosis present

## 2011-12-17 DIAGNOSIS — F0281 Dementia in other diseases classified elsewhere with behavioral disturbance: Secondary | ICD-10-CM | POA: Diagnosis present

## 2011-12-17 DIAGNOSIS — I129 Hypertensive chronic kidney disease with stage 1 through stage 4 chronic kidney disease, or unspecified chronic kidney disease: Secondary | ICD-10-CM | POA: Diagnosis present

## 2011-12-17 DIAGNOSIS — D649 Anemia, unspecified: Secondary | ICD-10-CM | POA: Diagnosis present

## 2011-12-17 DIAGNOSIS — Z9861 Coronary angioplasty status: Secondary | ICD-10-CM

## 2011-12-17 DIAGNOSIS — I2789 Other specified pulmonary heart diseases: Secondary | ICD-10-CM | POA: Diagnosis present

## 2011-12-17 DIAGNOSIS — I080 Rheumatic disorders of both mitral and aortic valves: Secondary | ICD-10-CM | POA: Diagnosis present

## 2011-12-17 DIAGNOSIS — Z888 Allergy status to other drugs, medicaments and biological substances status: Secondary | ICD-10-CM

## 2011-12-17 DIAGNOSIS — I10 Essential (primary) hypertension: Secondary | ICD-10-CM | POA: Diagnosis present

## 2011-12-17 DIAGNOSIS — I472 Ventricular tachycardia, unspecified: Secondary | ICD-10-CM | POA: Diagnosis present

## 2011-12-17 DIAGNOSIS — I509 Heart failure, unspecified: Secondary | ICD-10-CM | POA: Diagnosis present

## 2011-12-17 DIAGNOSIS — I4729 Other ventricular tachycardia: Secondary | ICD-10-CM | POA: Diagnosis present

## 2011-12-17 LAB — DIFFERENTIAL
Eosinophils Absolute: 0.2 10*3/uL (ref 0.0–0.7)
Lymphocytes Relative: 23 % (ref 12–46)
Lymphs Abs: 2.9 10*3/uL (ref 0.7–4.0)
Monocytes Relative: 8 % (ref 3–12)
Neutrophils Relative %: 67 % (ref 43–77)

## 2011-12-17 LAB — COMPREHENSIVE METABOLIC PANEL
ALT: 10 U/L (ref 0–35)
Alkaline Phosphatase: 70 U/L (ref 39–117)
BUN: 28 mg/dL — ABNORMAL HIGH (ref 6–23)
CO2: 27 mEq/L (ref 19–32)
Chloride: 108 mEq/L (ref 96–112)
GFR calc Af Amer: 40 mL/min — ABNORMAL LOW (ref >90)
GFR calc non Af Amer: 35 mL/min — ABNORMAL LOW (ref >90)
Glucose, Bld: 105 mg/dL — ABNORMAL HIGH (ref 70–99)
Potassium: 4.3 mEq/L (ref 3.5–5.1)
Sodium: 141 mEq/L (ref 135–145)
Total Bilirubin: 0.2 mg/dL — ABNORMAL LOW (ref 0.3–1.2)

## 2011-12-17 LAB — URINE MICROSCOPIC-ADD ON

## 2011-12-17 LAB — CBC
Hemoglobin: 9.8 g/dL — ABNORMAL LOW (ref 12.0–15.0)
MCH: 30.4 pg (ref 26.0–34.0)
RBC: 3.22 MIL/uL — ABNORMAL LOW (ref 3.87–5.11)
WBC: 12.3 10*3/uL — ABNORMAL HIGH (ref 4.0–10.5)

## 2011-12-17 LAB — URINALYSIS, ROUTINE W REFLEX MICROSCOPIC
Bilirubin Urine: NEGATIVE
Ketones, ur: NEGATIVE mg/dL
Leukocytes, UA: NEGATIVE
Nitrite: NEGATIVE
Protein, ur: 100 mg/dL — AB

## 2011-12-17 LAB — URINE CULTURE: Culture  Setup Time: 201301162122

## 2011-12-17 MED ORDER — MEMANTINE HCL 5 MG PO TABS
5.0000 mg | ORAL_TABLET | Freq: Two times a day (BID) | ORAL | Status: DC
  Administered 2011-12-17 – 2011-12-23 (×12): 5 mg via ORAL
  Filled 2011-12-17 (×13): qty 1

## 2011-12-17 MED ORDER — TRAMADOL HCL 50 MG PO TABS
50.0000 mg | ORAL_TABLET | Freq: Four times a day (QID) | ORAL | Status: DC | PRN
  Administered 2011-12-19 – 2011-12-20 (×2): 50 mg via ORAL
  Filled 2011-12-17 (×2): qty 1

## 2011-12-17 MED ORDER — ACETAMINOPHEN 650 MG RE SUPP: 650.0000 mg | Freq: Four times a day (QID) | RECTAL | Status: DC | PRN

## 2011-12-17 MED ORDER — HYDRALAZINE HCL 20 MG/ML IJ SOLN
10.0000 mg | INTRAMUSCULAR | Status: DC | PRN
  Administered 2011-12-19 – 2011-12-21 (×5): 10 mg via INTRAVENOUS
  Filled 2011-12-17 (×6): qty 0.5

## 2011-12-17 MED ORDER — ACETAMINOPHEN 325 MG PO TABS
650.0000 mg | ORAL_TABLET | Freq: Four times a day (QID) | ORAL | Status: DC | PRN
  Administered 2011-12-22: 650 mg via ORAL
  Filled 2011-12-17: qty 2

## 2011-12-17 MED ORDER — GUAIFENESIN-DM 100-10 MG/5ML PO SYRP: 5.0000 mL | ORAL_SOLUTION | ORAL | Status: DC | PRN

## 2011-12-17 MED ORDER — ONDANSETRON HCL 4 MG PO TABS: 4.0000 mg | ORAL_TABLET | Freq: Four times a day (QID) | ORAL | Status: DC | PRN

## 2011-12-17 MED ORDER — ALBUTEROL SULFATE (5 MG/ML) 0.5% IN NEBU: 2.5000 mg | INHALATION_SOLUTION | RESPIRATORY_TRACT | Status: DC | PRN

## 2011-12-17 MED ORDER — SODIUM CHLORIDE 0.9 % IV SOLN
INTRAVENOUS | Status: AC
  Administered 2011-12-18: 07:00:00 via INTRAVENOUS

## 2011-12-17 MED ORDER — ALUM & MAG HYDROXIDE-SIMETH 200-200-20 MG/5ML PO SUSP: 30.0000 mL | Freq: Four times a day (QID) | ORAL | Status: DC | PRN

## 2011-12-17 MED ORDER — HYDRALAZINE HCL 25 MG PO TABS
25.0000 mg | ORAL_TABLET | Freq: Three times a day (TID) | ORAL | Status: DC
Start: 2011-12-17 — End: 2011-12-23
  Administered 2011-12-17 – 2011-12-23 (×18): 25 mg via ORAL
  Filled 2011-12-17 (×20): qty 1

## 2011-12-17 MED ORDER — CLONIDINE HCL 0.2 MG/24HR TD PTWK
0.2000 mg | MEDICATED_PATCH | TRANSDERMAL | Status: DC
  Administered 2011-12-18: 0.2 mg via TRANSDERMAL
  Filled 2011-12-17: qty 1

## 2011-12-17 MED ORDER — ONDANSETRON HCL 4 MG/2ML IJ SOLN: 4.0000 mg | Freq: Four times a day (QID) | INTRAMUSCULAR | Status: DC | PRN

## 2011-12-17 MED ORDER — DILTIAZEM HCL ER COATED BEADS 180 MG PO CP24
180.0000 mg | ORAL_CAPSULE | Freq: Every day | ORAL | Status: DC
  Administered 2011-12-17 – 2011-12-23 (×7): 180 mg via ORAL
  Filled 2011-12-17 (×7): qty 1

## 2011-12-17 MED ORDER — FERROUS SULFATE 325 (65 FE) MG PO TABS
325.0000 mg | ORAL_TABLET | Freq: Every day | ORAL | Status: DC
  Administered 2011-12-18 – 2011-12-19 (×2): 325 mg via ORAL
  Filled 2011-12-17 (×3): qty 1

## 2011-12-17 NOTE — H&P (Signed)
PCP:   Alva Garnet., MD, MD  Cardiology: Luis Abed, MD, [facility]    Chief Complaint:   Confusion, aggressive behavior  HPI: Kristi [name] is a 78 y.o. female   has a past medical history of Atrial fibrillation; CHF (congestive heart failure); Ejection fraction; History of noncompliance with medical treatment; Hypertension; Dyslipidemia; Dementia; CAD (coronary artery disease); Degenerative joint disease; Chest pain; CKD (chronic kidney disease), stage IV; Edema; Aortic insufficiency; Mitral regurgitation; Pulmonary hypertension; Hyperlipidemia; Bradycardia; and Fatigue.   Presented with  Gradual onset of worsening confusion, wandering off, aggressive behavior over the past few years. Recently waked away from her home poorly dressed in the cold weather. She moved in with her children on christmas but she continued to attempt to walk outside. Patient have had poor appetite, Decreased PO intake. Denies chest pain or Shortness of breath. No fever no chills. In September she was supposed to be placed at [facility] but family states something did not work out with the paperwork. Patient was brought in today because her family could not leave her alone and had to come to the hospital for their own care. She became confused and combative while at short stay and was referred to ER. At ER social worker has been notified of her case. She was found to by slightly bradycardic to 50's with severe HTN with SBP up to 190's and having leukocytosis for unknown reason. Hospitalist was called for admition  Review of Systems:    Pertinent positives include: occasional arthritis pain, mild dyspnea on exertion  Constitutional:  No weight loss, night sweats, Fevers, chills, fatigue.  HEENT:  No headaches, Difficulty swallowing,Tooth/dental problems,Sore throat,  No sneezing, itching, ear ache, nasal congestion, post nasal drip,  Cardio-vascular:  No chest pain, Orthopnea, PND, anasarca,  dizziness, palpitations.no Bilateral lower extremity swelling  GI:  No heartburn, indigestion, abdominal pain, nausea, vomiting, diarrhea, change in bowel habits, loss of appetite, melena, blood in stool, hematoemesis Resp:  no shortness of breath at rest. No , No excess mucus, no productive cough, No non-productive cough, No coughing up of blood.No change in color of mucus.No wheezing.No chest wall deformity  Skin:  no rash or lesions.  GU:  no dysuria, change in color of urine, no urgency or frequency. No flank pain.  Musculoskeletal:  No joint pain or swelling. No decreased range of motion. No back pain.  Psych:  No change in mood or affect. No depression or anxiety. No memory loss.  Neuro: no localizing neurological complaints, no tingling, no weakness, no double vision, no gait abnormality, no slurred speech   Otherwise ROS are negative except for above, 10 systems were reviewed  Past Medical History: Past Medical History  Diagnosis Date  . Atrial fibrillation     Rapid response, hospital, September, 2012  . CHF (congestive heart failure)     Combined systolic and diastolic, EF 40-45%, 2012  . Ejection fraction     EF 40-45%, echo, September, 2012  . History of noncompliance with medical treatment   . Hypertension   . Dyslipidemia   . Dementia      ? mild ?  . CAD (coronary artery disease)     PCI to LAD early 1990s  . Degenerative joint disease   . Chest pain     Chest wall pain and costochondritis  . CKD (chronic kidney disease), stage IV   . Edema     Left greater than right lower extremity, No DVT by Doppler, September, 2012  .  Aortic insufficiency     Mild, echo, September, 2012  . Mitral regurgitation     Moderate, echo, 2012  . Pulmonary hypertension     46 mmHg, echo, 2012  . Hyperlipidemia   . Bradycardia     October, 2012  . Fatigue     October, 2012   Past Surgical History  Procedure Date  . Cardiac catheterization   . Cataract extraction       Medications: Prior to Admission medications   Medication Sig Start Date End Date Taking? Authorizing Provider  amLODipine (NORVASC) 10 MG tablet Take 10 mg by mouth daily.     Yes Historical Provider, MD  aspirin 81 MG tablet Take 81 mg by mouth daily.     Yes Historical Provider, MD  cloNIDine (CATAPRES - DOSED IN MG/24 HR) 0.2 mg/24hr patch Place 1 patch onto the skin once a week.     Yes Historical Provider, MD  diltiazem (CARDIZEM CD) 180 MG 24 hr capsule Take 180 mg by mouth daily.     Yes Historical Provider, MD  ferrous sulfate 325 (65 FE) MG tablet Take 325 mg by mouth daily with breakfast.     Yes Historical Provider, MD  metoprolol (TOPROL-XL) 25 MG 24 hr tablet Take 25 mg by mouth daily 09/03/11  Yes Luis Abed, MD  Multiple Vitamin (MULTIVITAMIN) tablet Take 1 tablet by mouth daily.     Yes Historical Provider, MD    Allergies:   Allergies  Allergen Reactions  . Atenolol     unknown  . Ramipril     REACTION: cough  . Simvastatin     unknown    Social History:  Ambulatory independently. Lives at home with family   reports that she has quit smoking. She does not have any smokeless tobacco history on file. She reports that she does not drink alcohol or use illicit drugs.   Family History: family history includes Coronary artery disease in her brother; Other in her father; and Pneumonia in her mother.    Physical Exam: Patient Vitals for the past 24 hrs:  BP Temp Temp src Pulse Resp SpO2  12/17/11 2038 180/83 mmHg - - 55  16  100 %  12/17/11 1800 183/96 mmHg 98.7 F (37.1 C) Oral 50  16  100 %  12/17/11 1645 152/84 mmHg 98.3 F (36.8 C) Oral 64  18  99 %    1. General:  in No Acute distress 2. Psychological: Alert and Oriented 3. Head/ENT:    Dry Mucous Membranes                          Head Non traumatic, neck supple                          Poor Dentition 4. SKIN: 2 decreased Skin turgor,  Skin clean Dry and intact no rash 5. Heart: Regular  rate and rhythm no Murmur, Rub or gallop 6. Lungs: Clear to auscultation bilaterally, no wheezes or crackles   7. Abdomen: Soft, non-tender, Non distended 8. Lower extremities: no clubbing, cyanosis, or edema 9. Neurologically Grossly intact, moving all 4 extremities equally 10. MSK: Normal range of motion  body mass index is unknown because there is no height or weight on file.   Labs on Admission:   [facility] 12/17/11 1832  NA 141  K 4.3  CL 108  CO2 27  GLUCOSE 105*  BUN 28*  CREATININE 1.41*  CALCIUM 9.6  MG --  PHOS --    Basename 12/17/11 1832  AST 18  ALT 10  ALKPHOS 70  BILITOT 0.2*  PROT 6.6  ALBUMIN 3.1*   No results found for this basename: LIPASE:2,AMYLASE:2 in the last 72 hours  Basename 12/17/11 1832  WBC 12.3*  NEUTROABS 8.1*  HGB 9.8*  HCT 30.5*  MCV 94.7  PLT 256   No results found for this basename: CKTOTAL:3,CKMB:3,CKMBINDEX:3,TROPONINI:3 in the last 72 hours No results found for this basename: TSH,T4TOTAL,FREET3,T3FREE,THYROIDAB in the last 72 hours No results found for this basename: VITAMINB12:2,FOLATE:2,FERRITIN:2,TIBC:2,IRON:2,RETICCTPCT:2 in the last 72 hours Lab Results  Component Value Date   HGBA1C 5.6 08/12/2011    The CrCl is unknown because both a height and weight (above a minimum accepted value) are required for this calculation. ABG    Component Value Date/Time   TCO2 29 02/26/2010 1748     Lab Results  Component Value Date   DDIMER 0.94* 08/12/2011     Other results:  I have pearsonaly reviewed this: ECG REPORT  Rate:55  Rhythm: Sinus rhythm ST&T Change: Q waves in III, AVF no sig change from prior  UA no infection   Cultures: No results found for this basename: sdes, specrequest, cult, reptstatus    Two-D echocardiogram performed on August 13, 2011, showing an EF       of 40-45% with moderate to severe LVH.  Mild aortic insufficiency       with moderate tricuspid mitral regurgitation.  Mildly dilated  right       ventricle with normal systolic function.  PASP 46 mmHg.   Radiological Exams on Admission: Dg Chest 2 View  12/17/2011  *RADIOLOGY REPORT*  Clinical Data: Shortness of breath when walking.  Slight confusion.  CHEST - 2 VIEW  Comparison: 08/12/2011.  Findings: Cardiomegaly.  Calcified tortuous aorta.  Clear lung fields.  No effusion or pneumothorax.  Bones unremarkable. Improved aeration.  IMPRESSION: Cardiomegaly with calcified tortuous aorta.  No acute cardiopulmonary disease.  Original Report Authenticated By: Elsie Stain, M.D.   Ct Head Wo Contrast  12/17/2011  *RADIOLOGY REPORT*  Clinical Data: Alzheimers insomnia  CT HEAD WITHOUT CONTRAST  Technique:  Contiguous axial images were obtained from the base of the skull through the vertex without contrast.  Comparison: 04/16/2007  Findings: Mild diffuse brain atrophy and periventricular white matter microvascular ischemic changes.  No acute intracranial hemorrhage, definite mass lesion, infarction, midline shift, herniation, hydrocephalus, or extra-axial fluid collection.  Gray- white matter differentiation maintained.  Slight cerebellar atrophy as well.  Symmetric orbits.  Mastoids and sinuses clear.  IMPRESSION: Stable atrophy and microvascular chronic ischemic changes.  No acute process.  Original Report Authenticated By: Judie Petit. Ruel Favors, M.D.    Assessment/Plan  78 YO F with history of dementia, atrial fibrillation here with progressively worsening dementia unsafe to stay at home. Severe malignant HTN and unexplained leucocytosis.  Present on Admission:  .Confusion - Will need placement patient not safe to stay at home .Atrial fibrillation - monitor on telemetry patient is currently not on ASA for unknown reason this should be further investigated, she was deemed to be not a coumadin candidate due to severe dementia .Hypertension - severe will give hydralazine PRN IV and start on po dose, patient has HX of CHF and renal  insuffiency.  .Dementia continue nemenda .Bradycardia - hold metoprolol she is on 3 medications that can cause bradycardia (clonadine, diltiazem and metoprolol) alternatively diltiazem  can be held since she is now in sinus rhythm .Leukocytosis - will repeat in am, so far no evidence of infection Hx of CHF now appears stable   Prophylaxis: SCD  CODE STATUS:DNR / DNI  Keithen Capo 12/17/2011, 8:50 PM

## 2011-12-17 NOTE — ED Provider Notes (Addendum)
History     CSN: [id number]  Arrival date & time 12/17/11  1635   First MD Initiated Contact with Patient 12/17/11 1750      Chief Complaint  Patient presents with  . Altered Mental Status    (Consider location/radiation/quality/duration/timing/severity/associated sxs/prior treatment) Patient is a 78 y.o. female presenting with altered mental status. The history is provided by the patient and a relative.  Altered Mental Status   patient here with her daughter complaining of worsening Alzheimer's. Symptoms have been progressively worse over the last 2-3 months. Patient's had increased wandering behavior. She also has had insomnia. No evidence of suicidal homicidal ideations. No recent history of falls. No fever, vomiting, diarrhea. Daughter has noted the patient to not have a cough, abdominal pain. No new medications have been used on the patient. Nothing makes . No recent history of urinary tract infection. Nothing makes the patient's symptoms better or worse  Past Medical History  Diagnosis Date  . Atrial fibrillation     Rapid response, hospital, September, 2012  . CHF (congestive heart failure)     Combined systolic and diastolic, EF 40-45%, 2012  . Ejection fraction     EF 40-45%, echo, September, 2012  . History of noncompliance with medical treatment   . Hypertension   . Dyslipidemia   . Dementia      ? mild ?  . CAD (coronary artery disease)     PCI to LAD early 1990s  . Degenerative joint disease   . Chest pain     Chest wall pain and costochondritis  . CKD (chronic kidney disease), stage IV   . Edema     Left greater than right lower extremity, No DVT by Doppler, September, 2012  . Aortic insufficiency     Mild, echo, September, 2012  . Mitral regurgitation     Moderate, echo, 2012  . Pulmonary hypertension     46 mmHg, echo, 2012  . Hyperlipidemia   . Bradycardia     October, 2012  . Fatigue     October, 2012    Past Surgical History  Procedure Date    . Cardiac catheterization   . Cataract extraction     Family History  Problem Relation Age of Onset  . Pneumonia Mother     died  . Other Father     died on a fire  . Coronary artery disease Brother     2 brothers    History  Substance Use Topics  . Smoking status: Former Games developer  . Smokeless tobacco: Not on file  . Alcohol Use: No    OB History    Grav Para Term Preterm Abortions TAB SAB Ect Mult Living                  Review of Systems  Psychiatric/Behavioral: Positive for altered mental status.  All other systems reviewed and are negative.    Allergies  Atenolol; Ramipril; and Simvastatin  Home Medications   Current Outpatient Rx  Name Route Sig Dispense Refill  . AMLODIPINE BESYLATE 10 MG PO TABS Oral Take 10 mg by mouth daily.      . ASPIRIN 81 MG PO TABS Oral Take 81 mg by mouth daily.      Marland Kitchen CLONIDINE HCL 0.2 MG/24HR TD PTWK Transdermal Place 1 patch onto the skin once a week.      Marland Kitchen DILTIAZEM HCL ER COATED BEADS 180 MG PO CP24 Oral Take 180 mg by mouth daily.      Marland Kitchen  FERROUS SULFATE 325 (65 FE) MG PO TABS Oral Take 325 mg by mouth daily with breakfast.      . METOPROLOL SUCCINATE ER 25 MG PO TB24  Take 25 mg by mouth daily    . ONE-DAILY MULTI VITAMINS PO TABS Oral Take 1 tablet by mouth daily.        BP 152/84  Pulse 64  Temp(Src) 98.3 F (36.8 C) (Oral)  Resp 18  SpO2 99%  Physical Exam  Nursing note and vitals reviewed. Constitutional: She appears well-developed and well-nourished.  Non-toxic appearance. No distress.  HENT:  Head: Normocephalic and atraumatic.  Eyes: Conjunctivae, EOM and lids are normal. Pupils are equal, round, and reactive to light.  Neck: Normal range of motion. Neck supple. No tracheal deviation present. No mass present.  Cardiovascular: Normal rate, regular rhythm and normal heart sounds.  Exam reveals no gallop.   No murmur heard. Pulmonary/Chest: Effort normal and breath sounds normal. No stridor. No respiratory  distress. She has no decreased breath sounds. She has no wheezes. She has no rhonchi. She has no rales.  Abdominal: Soft. Normal appearance and bowel sounds are normal. She exhibits no distension. There is no tenderness. There is no rebound and no CVA tenderness.  Musculoskeletal: Normal range of motion. She exhibits no edema and no tenderness.  Neurological: She is alert. She has normal strength. No cranial nerve deficit or sensory deficit. Coordination normal. GCS eye subscore is 4. GCS verbal subscore is 5. GCS motor subscore is 6.  Skin: Skin is warm and dry. No abrasion and no rash noted.  Psychiatric: Her speech is normal. Her affect is blunt. She is slowed. Cognition and memory are impaired. She expresses no suicidal plans and no homicidal plans.    ED Course  Procedures (including critical care time)   Labs Reviewed  CBC  DIFFERENTIAL  COMPREHENSIVE METABOLIC PANEL  URINALYSIS, ROUTINE W REFLEX MICROSCOPIC  URINE CULTURE   No results found.   No diagnosis found.    MDM  Patient had consult by social work. Patient cannot be placed to a facility from the ER. Spoke with the hospitalist and they will admit   Date: 12/17/2011  Rate: 55  Rhythm: normal sinus rhythm  QRS Axis: normal  Intervals: normal  ST/T Wave abnormalities: nonspecific ST/T changes  Conduction Disutrbances:right bundle branch block  Narrative Interpretation:   Old EKG Reviewed: unchanged        Toy Baker, MD 12/17/11 2051  Toy Baker, MD 12/17/11 2107

## 2011-12-17 NOTE — ED Notes (Signed)
Pt placed in gown, on monitor with continuous blood pressure and pulse oximetry 

## 2011-12-17 NOTE — ED Notes (Signed)
Spoke with pt/dtr.  Pt lives with her daughters who cannot provide pt with 24 hour care.  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  Pt has Medicare and Medicaid pending.  Will need PT/OT input for LOC needs, ST SNF then ALF or ALF with dementia care unit.  ED CSW to make referral to service line CSW for placement.  Pt has a limited income and cannot afford to pay out of pocket for her care and dtr. Works for a Materials engineer.

## 2011-12-17 NOTE — Progress Notes (Addendum)
Asked ED nurse during report to insert IV, was told that they are told not to if there are no IV medications ordered. IV fluids orders were signed and held. I attempted IV insertion and missed. Paged IV team for IV placement. Will start fluids as soon as pt has IV access.  Patient's daughter provided admission history information. Pt bed alarm activated and sitter in room. Will continue to monitor
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
## 2011-12-17 NOTE — ED Notes (Signed)
Report given to yellow side nurse.  Pt going to room 21.

## 2011-12-17 NOTE — ED Notes (Signed)
Informed EDP about elevated SBP.  No new orders at this time.

## 2011-12-18 LAB — COMPREHENSIVE METABOLIC PANEL
Albumin: 2.7 g/dL — ABNORMAL LOW (ref 3.5–5.2)
BUN: 24 mg/dL — ABNORMAL HIGH (ref 6–23)
Calcium: 9.1 mg/dL (ref 8.4–10.5)
GFR calc Af Amer: 44 mL/min — ABNORMAL LOW (ref >90)
Glucose, Bld: 111 mg/dL — ABNORMAL HIGH (ref 70–99)
Potassium: 3.7 mEq/L (ref 3.5–5.1)
Total Protein: 6.3 g/dL (ref 6.0–8.3)

## 2011-12-18 LAB — RETICULOCYTES
RBC.: 3.32 MIL/uL — ABNORMAL LOW (ref 3.87–5.11)
Retic Count, Absolute: 56.2 10*3/uL (ref 19.0–186.0)
Retic Ct Pct: 1.9 % (ref 0.4–3.1)
Retic Ct Pct: 2 % (ref 0.4–3.1)

## 2011-12-18 LAB — PHOSPHORUS: Phosphorus: 2.6 mg/dL (ref 2.3–4.6)

## 2011-12-18 LAB — CBC
HCT: 28.1 % — ABNORMAL LOW (ref 36.0–46.0)
Hemoglobin: 9.1 g/dL — ABNORMAL LOW (ref 12.0–15.0)
MCV: 94.9 fL (ref 78.0–100.0)
RBC: 2.96 MIL/uL — ABNORMAL LOW (ref 3.87–5.11)
WBC: 10 10*3/uL (ref 4.0–10.5)

## 2011-12-18 LAB — IRON AND TIBC
Iron: 37 ug/dL — ABNORMAL LOW (ref 42–135)
Saturation Ratios: 12 % — ABNORMAL LOW (ref 20–55)
TIBC: 218 ug/dL — ABNORMAL LOW (ref 250–470)
TIBC: 252 ug/dL (ref 250–470)
UIBC: 181 ug/dL (ref 125–400)

## 2011-12-18 LAB — MAGNESIUM: Magnesium: 2.2 mg/dL (ref 1.5–2.5)

## 2011-12-18 LAB — FERRITIN: Ferritin: 47 ng/mL (ref 10–291)

## 2011-12-18 NOTE — Progress Notes (Signed)
Utilization review completed.  

## 2011-12-18 NOTE — Progress Notes (Signed)
At 0225 pt allowed [place] to check her vitals. BP 160/82. No pt complaints. Pt still refusing IV insertion. Will continue to monitor

## 2011-12-18 NOTE — Progress Notes (Signed)
Subjective: Patient pleasantly confused, no complaints.  Objective: Filed Vitals:   12/17/11 2223 12/18/11 0227 12/18/11 0454 12/18/11 1527  BP: 165/74 160/82 175/80 147/78  Pulse: 59 53 53 73  Temp:  98.6 F (37 C) 98.4 F (36.9 C) 97.3 F (36.3 C)  TempSrc:  Oral Oral Oral  Resp:  18 18 18   Height:      Weight:      SpO2:  97% 97% 96%   Weight change:   Intake/Output Summary (Last 24 hours) at 12/18/11 1712 Last data filed at 12/18/11 1300  Gross per 24 hour  Intake    780 ml  Output    751 ml  Net     29 ml    General: Alert, awake,, in no acute distress.  HEENT: No bruits, no goiter.  Heart: Regular rate and rhythm, without murmurs, rubs, gallops.  Lungs: Crackles left side, bilateral air movement.  Abdomen: Soft, nontender, nondistended, positive bowel sounds.  Neuro: Grossly intact, nonfocal. Extremities ; no edema.   Lab Results:  [facility] 12/18/11 0605 12/17/11 1832  NA 141 141  K 3.7 4.3  CL 108 108  CO2 26 27  GLUCOSE 111* 105*  BUN 24* 28*  CREATININE 1.30* 1.41*  CALCIUM 9.1 9.6  MG 2.2 --  PHOS 2.6 --    [facility] 12/18/11 0605 12/17/11 1832  AST 15 18  ALT 9 10  ALKPHOS 64 70  BILITOT 0.2* 0.2*  PROT 6.3 6.6  ALBUMIN 2.7* 3.1*      Basename 12/18/11 0605 12/17/11 1832  WBC 10.0 12.3*  NEUTROABS -- 8.1*  HGB 9.1* 9.8*  HCT 28.1* 30.5*  MCV 94.9 94.7  PLT 249 256    Basename 12/18/11 0605  VITAMINB12 --  FOLATE --  FERRITIN --  TIBC --  IRON --  RETICCTPCT 1.9    Micro Results: No results found for this or any previous visit (from the past 240 hour(s)).  Studies/Results: Dg Chest 2 View  12/17/2011  *RADIOLOGY REPORT*  Clinical Data: Shortness of breath when walking.  Slight confusion.  CHEST - 2 VIEW  Comparison: 08/12/2011.  Findings: Cardiomegaly.  Calcified tortuous aorta.  Clear lung fields.  No effusion or pneumothorax.  Bones unremarkable. Improved aeration.  IMPRESSION: Cardiomegaly with calcified tortuous aorta.   No acute cardiopulmonary disease.  Original Report Authenticated By: Elsie Stain, M.D.   Ct Head Wo Contrast  12/17/2011  *RADIOLOGY REPORT*  Clinical Data: Alzheimers insomnia  CT HEAD WITHOUT CONTRAST  Technique:  Contiguous axial images were obtained from the base of the skull through the vertex without contrast.  Comparison: 04/16/2007  Findings: Mild diffuse brain atrophy and periventricular white matter microvascular ischemic changes.  No acute intracranial hemorrhage, definite mass lesion, infarction, midline shift, herniation, hydrocephalus, or extra-axial fluid collection.  Gray- white matter differentiation maintained.  Slight cerebellar atrophy as well.  Symmetric orbits.  Mastoids and sinuses clear.  IMPRESSION: Stable atrophy and microvascular chronic ischemic changes.  No acute process.  Original Report Authenticated By: Judie Petit. Ruel Favors, M.D.    Medications: I have reviewed the patient's current medications.   Patient Active Hospital Problem List:  Atrial fibrillation () Continue with diltiazem.  Hypertension / Malignant HTN Continue with clonidine, hydralazine, diltiazem.   Dementia () Patient will likely benefit form placement.  Continue with Namenda.  PT /OT consult.  Bradycardia () Metoprolol stopped. Monitor on diltiazem.   Leukocytosis (12/17/2011) Resolved. Stress related.   Confusion (12/17/2011) Probably worsening dementia.  Chest x  ray negative for PNA. UA negative , follow culture.  I will check B12, TSH, RPR.   Anemia (12/17/2011) HB stable. Check anemia panel.     LOS: 1 day   Aubrianna Orchard M.D.  [facility] 12/18/2011, 5:12 PM

## 2011-12-18 NOTE — Progress Notes (Signed)
Pt refused IV start again. 2 IV nurses, myself, and the charge nurse tried to convince the pt and explain the importance.  Pt repeated over that she did not want one or any part of it and hope God would not let her have a stroke. Pt also refused to have her vitals taken, explained importance and process. Pt stated she wanted to be left alone and that she would not let me.  Pt is showing signs of irritation. Pt is oriented to self only. Pt is able briefly carry a conversation but then becomes distracted or says something unrelated. Will notify MD. Will continue to monitor

## 2011-12-18 NOTE — Progress Notes (Signed)
   CARE MANAGEMENT NOTE 12/18/2011  Patient:  Kristi [name], Kristi [name]   Account Number:  [id number]  Date Initiated:  12/18/2011  Documentation initiated by:  Letha Cape  Subjective/Objective Assessment:   dx afib  admit- lives with daughter - gay clark [phone] cell.     Action/Plan:   pt/ot eval   Anticipated DC Date:  12/20/2011   Anticipated DC Plan:  SKILLED NURSING FACILITY  In-house referral  Clinical Social Worker      DC Planning Services  CM consult      Choice offered to / List presented to:             Status of service:  In process, will continue to follow Medicare Important Message given?   (If response is "NO", the following Medicare IM given date fields will be blank) Date Medicare IM given:   Date Additional Medicare IM given:    Discharge Disposition:    Per UR Regulation:    Comments:  PCP Andi Devon  12/18/11 15:14 Letha Cape RN, BSN [phone] patient lives with daughter, gay clark [phone].  Daughter states she would like for patient to go to snf for long term because she no longer can take care of patient at home, she has a sick husband and a 32 year old child.  I asked if this is what patient wants, daughter states patient does not know what she wants.  When I spoke with patient she did not seem to comprehend what I was asking her.  I will make referral to CSW. Awaiting pt/ot eval.

## 2011-12-18 NOTE — Progress Notes (Signed)
Pt refused to let IV nurse start IV. Notified MD. Instructed to try again. Will continue to monitor

## 2011-12-18 NOTE — Progress Notes (Signed)
Pt reeducated about IV and blood pressure medicine importance. Pt agreed to have IV. IV team paged and pt placed on list. Will continue to monitor

## 2011-12-18 NOTE — Progress Notes (Signed)
Physical Therapy Evaluation Patient Details Name: Kristi [name] MRN: [id number] DOB: 1931-04-10 Today's Date: 12/18/2011  Problem List:  Patient Active Problem List  Diagnoses  . Atrial fibrillation  . CHF (congestive heart failure)  . Ejection fraction  . History of noncompliance with medical treatment  . Hypertension  . Dyslipidemia  . Dementia  . CAD (coronary artery disease)  . Chest pain  . CKD (chronic kidney disease), stage IV  . Edema  . Aortic insufficiency  . Mitral regurgitation  . Pulmonary hypertension  . Bradycardia  . Fatigue  . Leukocytosis  . Confusion  . Anemia    Past Medical History:  Past Medical History  Diagnosis Date  . Atrial fibrillation     Rapid response, hospital, September, 2012  . CHF (congestive heart failure)     Combined systolic and diastolic, EF 40-45%, 2012  . Ejection fraction     EF 40-45%, echo, September, 2012  . History of noncompliance with medical treatment   . Hypertension   . Dyslipidemia   . Dementia      ? mild ?  . CAD (coronary artery disease)     PCI to LAD early 1990s  . Degenerative joint disease   . Chest pain     Chest wall pain and costochondritis  . CKD (chronic kidney disease), stage IV   . Edema     Left greater than right lower extremity, No DVT by Doppler, September, 2012  . Aortic insufficiency     Mild, echo, September, 2012  . Mitral regurgitation     Moderate, echo, 2012  . Pulmonary hypertension     46 mmHg, echo, 2012  . Hyperlipidemia   . Bradycardia     October, 2012  . Fatigue     October, 2012   Past Surgical History:  Past Surgical History  Procedure Date  . Cardiac catheterization   . Cataract extraction     PT Assessment/Plan/Recommendation PT Assessment Clinical Impression Statement: Pt presents with AMS and wandering from home. Pt at baseline functional level in definite need of supervision with all mobility and recommend SNF for 24 hr supervision but no further  P.T. secondary to pt lack of retention for learning.  PT Recommendation/Assessment: Patent does not need any further PT services No Skilled PT: All education completed;Patient at baseline level of functioning;Patient is supervision for all activity/mobility PT Recommendation Follow Up Recommendations: Supervision/Assistance - 24 hour, SNF as needed for appropriate assist/supervision not for P.T. Equipment Recommended: None recommended by PT PT Goals     PT Evaluation Precautions/Restrictions  Precautions Precautions: Fall Prior Functioning  Home Living Lives With: Family Receives Help From: Family Type of Home: House Home Adaptive Equipment: None Additional Comments: Pt unable to recall home environment  Prior Function Level of Independence: Independent with basic ADLs;Independent with transfers;Independent with gait Driving: No Vocation: Retired Producer, television/film/video: Awake/alert Overall Cognitive Status: History of cognitive impairments Orientation Level: Oriented to person Cognition - Other Comments: Pt aware of name, not age, aware of city not place or situation. Pt unable to recall room number even 10 sec later. Pt with significant memory deficits. Pt required cueing to complete ADL for brushing teeth and locating toilet paper after voiding. Safety awareness, memory, and problem solving all impaired. Sensation/Coordination Sensation Light Touch: Appears Intact Extremity Assessment RLE Assessment RLE Assessment: Within Functional Limits LLE Assessment LLE Assessment: Within Functional Limits Mobility (including Balance) Bed Mobility Bed Mobility: Yes Transfers Transfers: Yes Sit to Stand: From bed;From  toilet;5: Supervision Stand to Sit: To chair/3-in-1;To toilet;5: Supervision Stand to Sit Details: supervision for safety Ambulation/Gait Ambulation/Gait: Yes Ambulation/Gait Assistance: 5: Supervision Ambulation/Gait Assistance Details (indicate cue  type and reason): MIn guard assist for safety and directional cues. Pt ambulates majority of distance with hands held behind her back Ambulation Distance (Feet): 800 Feet Assistive device: None Gait Pattern: Within Functional Limits Stairs: No  Posture/Postural Control Posture/Postural Control: Postural limitations Postural Limitations: kyphotic posture Balance Balance Assessed: Yes Static Sitting Balance Static Sitting - Level of Assistance: 6: Modified independent (Device/Increase time) Static Standing Balance Static Standing - Level of Assistance: 5: Stand by assistance Exercise    End of Session PT - End of Session Equipment Utilized During Treatment: Gait belt Activity Tolerance: Patient tolerated treatment well Patient left: in chair;with family/visitor present;Other (comment);with call bell in reach Psychiatrist present) Nurse Communication: Mobility status for transfers General Behavior During Session: [facility] for tasks performed Cognition: Impaired, at baseline  Kristi [name] 12/18/2011, 4:39 PM Toney Sang, PT [phone]

## 2011-12-18 NOTE — Progress Notes (Signed)
INITIAL ADULT NUTRITION ASSESSMENT Date: 12/18/2011   Time: 11:39 AM Reason for Assessment: Nutrition Risk "family reports poor diet and patient refuses to gain weight"  ASSESSMENT: Female 78 y.o.  Dx:  Patient Active Problem List  Diagnoses  . Atrial fibrillation  . CHF (congestive heart failure)  . Ejection fraction  . History of noncompliance with medical treatment  . Hypertension  . Dyslipidemia  . Dementia  . CAD (coronary artery disease)  . Chest pain  . CKD (chronic kidney disease), stage IV  . Edema  . Aortic insufficiency  . Mitral regurgitation  . Pulmonary hypertension  . Bradycardia  . Fatigue  . Leukocytosis  . Confusion  . Anemia     Hx:  Past Medical History  Diagnosis Date  . Atrial fibrillation     Rapid response, hospital, September, 2012  . CHF (congestive heart failure)     Combined systolic and diastolic, EF 40-45%, 2012  . Ejection fraction     EF 40-45%, echo, September, 2012  . History of noncompliance with medical treatment   . Hypertension   . Dyslipidemia   . Dementia      ? mild ?  . CAD (coronary artery disease)     PCI to LAD early 1990s  . Degenerative joint disease   . Chest pain     Chest wall pain and costochondritis  . CKD (chronic kidney disease), stage IV   . Edema     Left greater than right lower extremity, No DVT by Doppler, September, 2012  . Aortic insufficiency     Mild, echo, September, 2012  . Mitral regurgitation     Moderate, echo, 2012  . Pulmonary hypertension     46 mmHg, echo, 2012  . Hyperlipidemia   . Bradycardia     October, 2012  . Fatigue     October, 2012    Related Meds:     . cloNIDine  0.2 mg Transdermal Weekly  . diltiazem  180 mg Oral Daily  . ferrous sulfate  325 mg Oral Q breakfast  . hydrALAZINE  25 mg Oral Q8H  . memantine  5 mg Oral BID     Ht: 5\' 2"  (157.5 cm)  Wt: 115 lb 4.8 oz (52.3 kg)  Ideal Wt: 50.1 kg % Ideal Wt: 104%  Usual Wt:  Wt Readings from Last 3  Encounters:  12/17/11 115 lb 4.8 oz (52.3 kg)  09/03/11 103 lb (46.72 kg)   % Usual Wt: 112%  Body mass index is 21.09 kg/(m^2).  Food/Nutrition Related Hx: Patient reports eating well, some difficulty with chewing and swallowing but when asked further questions about it, patient could not elaborate. Patient per past weight in OCT has gained weight. Patient has a normal BMI  Labs:  CMP     Component Value Date/Time   NA 141 12/18/2011 0605   K 3.7 12/18/2011 0605   CL 108 12/18/2011 0605   CO2 26 12/18/2011 0605   GLUCOSE 111* 12/18/2011 0605   BUN 24* 12/18/2011 0605   CREATININE 1.30* 12/18/2011 0605   CALCIUM 9.1 12/18/2011 0605   PROT 6.3 12/18/2011 0605   ALBUMIN 2.7* 12/18/2011 0605   AST 15 12/18/2011 0605   ALT 9 12/18/2011 0605   ALKPHOS 64 12/18/2011 0605   BILITOT 0.2* 12/18/2011 0605   GFRNONAA 38* 12/18/2011 0605   GFRAA 44* 12/18/2011 0605   I/O last 3 completed shifts: In: -  Out: 400 [Urine:400]   Diet Order: General  Supplements/Tube Feeding: none  IVF:    sodium chloride Last Rate: 50 mL/hr at 12/18/11 0645    Estimated Nutritional Needs:   Kcal: 1200-1400  Protein: 50-60 gm Fluid: > 1.5 L  Per comment in nutrition risk patient is refusing to gain weight, unsure about this comment, RN reports no knowledge either about what this comment is concerning. Patient has a normal BMI and eating well. Patient does not appear to be refusing to eat. Patient does not need to gain weight at this time if she does not desire to. No family available at this time to discuss this comment with. RD will continue to follow and monitor for refusal of meals or reason why she would need to gain weight.   NUTRITION DIAGNOSIS: No nutrition diagnosis as this time  MONITORING/EVALUATION(Goals): Goal: PO intake will continue to be >75% of most meals Monitor: weight, PO intake, labs  EDUCATION NEEDS: -No education needs identified at this time  INTERVENTION: No intervention at this  time. RD will follow  Dietitian [phone]  DOCUMENTATION CODES Per approved criteria  -Not Applicable    Clarene Duke MARIE 12/18/2011, 11:39 AM

## 2011-12-19 DIAGNOSIS — D72829 Elevated white blood cell count, unspecified: Secondary | ICD-10-CM

## 2011-12-19 DIAGNOSIS — F0281 Dementia in other diseases classified elsewhere with behavioral disturbance: Secondary | ICD-10-CM

## 2011-12-19 DIAGNOSIS — G309 Alzheimer's disease, unspecified: Principal | ICD-10-CM

## 2011-12-19 LAB — FERRITIN: Ferritin: 54 ng/mL (ref 10–291)

## 2011-12-19 MED ORDER — CYANOCOBALAMIN 1000 MCG/ML IJ SOLN
100.0000 ug | Freq: Every day | INTRAMUSCULAR | Status: DC
  Administered 2011-12-19 – 2011-12-23 (×5): 100 ug via INTRAMUSCULAR
  Filled 2011-12-19 (×6): qty 0.1

## 2011-12-19 MED ORDER — FERROUS SULFATE 325 (65 FE) MG PO TABS
325.0000 mg | ORAL_TABLET | Freq: Two times a day (BID) | ORAL | Status: DC
  Administered 2011-12-19 – 2011-12-23 (×8): 325 mg via ORAL
  Filled 2011-12-19 (×11): qty 1

## 2011-12-19 NOTE — Progress Notes (Signed)
Spoke with pt/dtr. Pt lives with her daughters who cannot provide pt with 24 hour care. 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 Pt has Medicare and Medicaid pending. Will need PT/OT input for LOC needs, ST SNF then ALF or ALF with dementia care unit. ED CSW to make referral to service line CSW for placement. Pt has a limited income and cannot afford to pay out of pocket for her care and dtr. Works for a [employer].  Per ED night CSW: JODY DRAKE    patient here with her daughter complaining of worsening Alzheimer's. Symptoms have been progressively worse over the last 2-3 months. Patient's had increased wandering behavior. She also has had insomnia. No evidence of suicidal homicidal ideations. No recent history of falls.   Clinical Social Work spoke with patient daughter Kristi [name], who reports that patient lives alone and Dementia has become worse over time.  Daughter reports she cannot care for her 24/7 and patient wanders and difficult to care for.   Daughter does not disclose any other mental health issues regarding patient behaviors and per CSW with ED in the evening, patient is pleasantly demented.    Due to patient lacking behaviors CSW does not feeling patient would be appropriate for [place].  No SI, HI, or psychosis.  This is a placement issue and would most likely need ALF in a memory care unit.  CSW discussed with service line and treatment team: Kristi [name], with regards to patient insurance: [employer], she could potentially be denied due to cognition and independence.   Will need to discuss options of ALF and payor source with regards to [facility] pending.  Will follow up and continue to follow.  Kristi [name], MSW LCSW [phone]

## 2011-12-19 NOTE — Consult Note (Signed)
Reason for Consult:Evaluate Capacity  Referring Physician:Dr. Tristen Pennino is an 78 y.o. female.  HPI: Pt has Multisystemic Disease and progressive dementia.     Past Medical History  Diagnosis Date  . Atrial fibrillation     Rapid response, hospital, September, 2012  . CHF (congestive heart failure)     Combined systolic and diastolic, EF 40-45%, 2012  . Ejection fraction     EF 40-45%, echo, September, 2012  . History of noncompliance with medical treatment   . Hypertension   . Dyslipidemia   . Dementia      ? mild ?  . CAD (coronary artery disease)     PCI to LAD early 1990s  . Degenerative joint disease   . Chest pain     Chest wall pain and costochondritis  . CKD (chronic kidney disease), stage IV   . Edema     Left greater than right lower extremity, No DVT by Doppler, September, 2012  . Aortic insufficiency     Mild, echo, September, 2012  . Mitral regurgitation     Moderate, echo, 2012  . Pulmonary hypertension     46 mmHg, echo, 2012  . Hyperlipidemia   . Bradycardia     October, 2012  . Fatigue     October, 2012    Past Surgical History  Procedure Date  . Cardiac catheterization   . Cataract extraction     Family History  Problem Relation Age of Onset  . Pneumonia Mother     died  . Other Father     died on a fire  . Coronary artery disease Brother     2 brothers    Social History:  reports that she has quit smoking. She does not have any smokeless tobacco history on file. She reports that she does not drink alcohol or use illicit drugs.  Allergies:  Allergies  Allergen Reactions  . Atenolol     unknown  . Ramipril     REACTION: cough  . Simvastatin     unknown    Medications: I have reviewed the patient's current medications.  Results for orders placed during the hospital encounter of 12/17/11 (from the past 48 hour(s))  CBC     Status: Abnormal   Collection Time   12/17/11  6:32 PM      Component Value Range Comment     WBC 12.3 (*) 4.0 - 10.5 (K/uL)    RBC 3.22 (*) 3.87 - 5.11 (MIL/uL)    Hemoglobin 9.8 (*) 12.0 - 15.0 (g/dL)    HCT 16.1 (*) 09.6 - 46.0 (%)    MCV 94.7  78.0 - 100.0 (fL)    MCH 30.4  26.0 - 34.0 (pg)    MCHC 32.1  30.0 - 36.0 (g/dL)    RDW 04.5  40.9 - 81.1 (%)    Platelets 256  150 - 400 (K/uL)   DIFFERENTIAL     Status: Abnormal   Collection Time   12/17/11  6:32 PM      Component Value Range Comment   Neutrophils Relative 67  43 - 77 (%)    Neutro Abs 8.1 (*) 1.7 - 7.7 (K/uL)    Lymphocytes Relative 23  12 - 46 (%)    Lymphs Abs 2.9  0.7 - 4.0 (K/uL)    Monocytes Relative 8  3 - 12 (%)    Monocytes Absolute 1.0  0.1 - 1.0 (K/uL)    Eosinophils Relative 2  0 - 5 (%)    Eosinophils Absolute 0.2  0.0 - 0.7 (K/uL)    Basophils Relative 0  0 - 1 (%)    Basophils Absolute 0.0  0.0 - 0.1 (K/uL)   COMPREHENSIVE METABOLIC PANEL     Status: Abnormal   Collection Time   12/17/11  6:32 PM      Component Value Range Comment   Sodium 141  135 - 145 (mEq/L)    Potassium 4.3  3.5 - 5.1 (mEq/L)    Chloride 108  96 - 112 (mEq/L)    CO2 27  19 - 32 (mEq/L)    Glucose, Bld 105 (*) 70 - 99 (mg/dL)    BUN 28 (*) 6 - 23 (mg/dL)    Creatinine, Ser 4.09 (*) 0.50 - 1.10 (mg/dL)    Calcium 9.6  8.4 - 10.5 (mg/dL)    Total Protein 6.6  6.0 - 8.3 (g/dL)    Albumin 3.1 (*) 3.5 - 5.2 (g/dL)    AST 18  0 - 37 (U/L)    ALT 10  0 - 35 (U/L)    Alkaline Phosphatase 70  39 - 117 (U/L)    Total Bilirubin 0.2 (*) 0.3 - 1.2 (mg/dL)    GFR calc non Af Amer 35 (*) >90 (mL/min)    GFR calc Af Amer 40 (*) >90 (mL/min)   URINALYSIS, ROUTINE W REFLEX MICROSCOPIC     Status: Abnormal   Collection Time   12/17/11  7:59 PM      Component Value Range Comment   Color, Urine YELLOW  YELLOW     APPearance CLEAR  CLEAR     Specific Gravity, Urine 1.018  1.005 - 1.030     pH 6.0  5.0 - 8.0     Glucose, UA NEGATIVE  NEGATIVE (mg/dL)    Hgb urine dipstick NEGATIVE  NEGATIVE     Bilirubin Urine NEGATIVE  NEGATIVE      Ketones, ur NEGATIVE  NEGATIVE (mg/dL)    Protein, ur 811 (*) NEGATIVE (mg/dL)    Urobilinogen, UA 0.2  0.0 - 1.0 (mg/dL)    Nitrite NEGATIVE  NEGATIVE     Leukocytes, UA NEGATIVE  NEGATIVE    URINE CULTURE     Status: Normal   Collection Time   12/17/11  7:59 PM      Component Value Range Comment   Specimen Description URINE, CLEAN CATCH      Special Requests NONE      Setup Time [id number]      Colony Count 40,000 COLONIES/ML      Culture        Value: Multiple bacterial morphotypes present, none predominant. Suggest appropriate recollection if clinically indicated.   Report Status 12/19/2011 FINAL     URINE MICROSCOPIC-ADD ON     Status: Normal   Collection Time   12/17/11  7:59 PM      Component Value Range Comment   Squamous Epithelial / LPF RARE  RARE     WBC, UA 0-2  <3 (WBC/hpf)    RBC / HPF 0-2  <3 (RBC/hpf)   MAGNESIUM     Status: Normal   Collection Time   12/18/11  6:05 AM      Component Value Range Comment   Magnesium 2.2  1.5 - 2.5 (mg/dL)   PHOSPHORUS     Status: Normal   Collection Time   12/18/11  6:05 AM      Component Value Range Comment  Phosphorus 2.6  2.3 - 4.6 (mg/dL)   TSH     Status: Normal   Collection Time   12/18/11  6:05 AM      Component Value Range Comment   TSH 2.106  0.350 - 4.500 (uIU/mL)   COMPREHENSIVE METABOLIC PANEL     Status: Abnormal   Collection Time   12/18/11  6:05 AM      Component Value Range Comment   Sodium 141  135 - 145 (mEq/L)    Potassium 3.7  3.5 - 5.1 (mEq/L)    Chloride 108  96 - 112 (mEq/L)    CO2 26  19 - 32 (mEq/L)    Glucose, Bld 111 (*) 70 - 99 (mg/dL)    BUN 24 (*) 6 - 23 (mg/dL)    Creatinine, Ser 8.11 (*) 0.50 - 1.10 (mg/dL)    Calcium 9.1  8.4 - 10.5 (mg/dL)    Total Protein 6.3  6.0 - 8.3 (g/dL)    Albumin 2.7 (*) 3.5 - 5.2 (g/dL)    AST 15  0 - 37 (U/L)    ALT 9  0 - 35 (U/L)    Alkaline Phosphatase 64  39 - 117 (U/L)    Total Bilirubin 0.2 (*) 0.3 - 1.2 (mg/dL)    GFR calc non Af Amer 38 (*) >90  (mL/min)    GFR calc Af Amer 44 (*) >90 (mL/min)   CBC     Status: Abnormal   Collection Time   12/18/11  6:05 AM      Component Value Range Comment   WBC 10.0  4.0 - 10.5 (K/uL)    RBC 2.96 (*) 3.87 - 5.11 (MIL/uL)    Hemoglobin 9.1 (*) 12.0 - 15.0 (g/dL)    HCT 91.4 (*) 78.2 - 46.0 (%)    MCV 94.9  78.0 - 100.0 (fL)    MCH 30.7  26.0 - 34.0 (pg)    MCHC 32.4  30.0 - 36.0 (g/dL)    RDW 95.6  21.3 - 08.6 (%)    Platelets 249  150 - 400 (K/uL)   VITAMIN B12     Status: Normal   Collection Time   12/18/11  6:05 AM      Component Value Range Comment   Vitamin B-12 231  211 - 911 (pg/mL)   FOLATE     Status: Normal   Collection Time   12/18/11  6:05 AM      Component Value Range Comment   Folate 8.3     IRON AND TIBC     Status: Abnormal   Collection Time   12/18/11  6:05 AM      Component Value Range Comment   Iron 37 (*) 42 - 135 (ug/dL)    TIBC 578 (*) 469 - 470 (ug/dL)    Saturation Ratios 17 (*) 20 - 55 (%)    UIBC 181  125 - 400 (ug/dL)   FERRITIN     Status: Normal   Collection Time   12/18/11  6:05 AM      Component Value Range Comment   Ferritin 47  10 - 291 (ng/mL)   RETICULOCYTES     Status: Abnormal   Collection Time   12/18/11  6:05 AM      Component Value Range Comment   Retic Ct Pct 1.9  0.4 - 3.1 (%)    RBC. 2.96 (*) 3.87 - 5.11 (MIL/uL)    Retic Count, Manual 56.2  19.0 - 186.0 (K/uL)  VITAMIN B12     Status: Normal   Collection Time   12/18/11  5:41 PM      Component Value Range Comment   Vitamin B-12 305  211 - 911 (pg/mL)   FOLATE     Status: Normal   Collection Time   12/18/11  5:41 PM      Component Value Range Comment   Folate 11.9     IRON AND TIBC     Status: Abnormal   Collection Time   12/18/11  5:41 PM      Component Value Range Comment   Iron 30 (*) 42 - 135 (ug/dL)    TIBC 454  098 - 119 (ug/dL)    Saturation Ratios 12 (*) 20 - 55 (%)    UIBC 222  125 - 400 (ug/dL)   FERRITIN     Status: Normal   Collection Time   12/18/11  5:41 PM       Component Value Range Comment   Ferritin 54  10 - 291 (ng/mL)   RETICULOCYTES     Status: Abnormal   Collection Time   12/18/11  5:41 PM      Component Value Range Comment   Retic Ct Pct 2.0  0.4 - 3.1 (%)    RBC. 3.32 (*) 3.87 - 5.11 (MIL/uL)    Retic Count, Manual 66.4  19.0 - 186.0 (K/uL)   TSH     Status: Normal   Collection Time   12/18/11  5:41 PM      Component Value Range Comment   TSH 2.094  0.350 - 4.500 (uIU/mL)   RPR     Status: Normal   Collection Time   12/18/11  5:41 PM      Component Value Range Comment   RPR NON REACTIVE  NON REACTIVE      Dg Chest 2 View  12/17/2011  *RADIOLOGY REPORT*  Clinical Data: Shortness of breath when walking.  Slight confusion.  CHEST - 2 VIEW  Comparison: 08/12/2011.  Findings: Cardiomegaly.  Calcified tortuous aorta.  Clear lung fields.  No effusion or pneumothorax.  Bones unremarkable. Improved aeration.  IMPRESSION: Cardiomegaly with calcified tortuous aorta.  No acute cardiopulmonary disease.  Original Report Authenticated By: Elsie Stain, M.D.   Ct Head Wo Contrast  12/17/2011  *RADIOLOGY REPORT*  Clinical Data: Alzheimers insomnia  CT HEAD WITHOUT CONTRAST  Technique:  Contiguous axial images were obtained from the base of the skull through the vertex without contrast.  Comparison: 04/16/2007  Findings: Mild diffuse brain atrophy and periventricular white matter microvascular ischemic changes.  No acute intracranial hemorrhage, definite mass lesion, infarction, midline shift, herniation, hydrocephalus, or extra-axial fluid collection.  Gray- white matter differentiation maintained.  Slight cerebellar atrophy as well.  Symmetric orbits.  Mastoids and sinuses clear.  IMPRESSION: Stable atrophy and microvascular chronic ischemic changes.  No acute process.  Original Report Authenticated By: Judie Petit. Ruel Favors, M.D.    Review of Systems  Unable to perform ROS: acuity of condition   Blood pressure 192/92, pulse 70, temperature 98.2 F (36.8  C), temperature source Oral, resp. rate 18, height 5\' 2"  (1.575 m), weight 52.3 kg (115 lb 4.8 oz), SpO2 99.00%. Physical Exam  Assessment/Plan: Psychiatry CSW note is appreciated. Pt is interviewed without sitter present.  She is sitting up and creates interviewer appropriately. She is wearing a salt-and-pepper wig. She is calm and engages in conversation easily. She is alert, oriented to person, place but is unable to state  the date. She is aware that it snowed and says it is winter. She has 7 children and can only name 3. She admits to being forgetful and admits to wandering out of the house. She realizes her children bring her back to her home. She does not know her medications does not describe her medical conditions. She states that she lives with her daughter a but her daughter states that she lives alone. The daughter my interview states that she cannot provide 24-hour care and is very concerned because she has found her mother wandering from the home. The daughter is concerned that her mother may wander and not find her way back. She is able to exhibit very short working [employer] fast streamer. She is unable to repeat a 10 digit number she denies thoughts of suicide and denies ever making an attempt. She denies voices and wraps on the wooden armchairs but she has never heard voices. The comments made by the daughter and the interview today suggests that this patient may be at risk to be left living on her own. She is given the scenario of discovering a fire and smoke in her home. She hesitates to answer and then sets her children live really close and she would run to their home. His displays good judgment but the report of her daughter stating that she may wander outside with an appropriate clothing suggest that she does not always exhibit good judgment given the conditions.   RECOMMENDATION: 1 this patient has dementia other significant degree that leaving her any single household would not be advisable. It is  recommended that an Alzheimer's dementia unit be identified for transfer and discharge.  2 consider augmentation of dementia medication Namenda  with Exelon patch. Will  3 I am signing off this consultation unless recall is requested.    Daison Braxton 12/19/2011, 3:44 PM    And and and

## 2011-12-19 NOTE — Progress Notes (Signed)
Occupational Therapy Evaluation Patient Details Name: Kristi [name] MRN: [id number] DOB: 03-18-1931 Today's Date: 12/19/2011  Problem List:  Patient Active Problem List  Diagnoses  . Atrial fibrillation  . CHF (congestive heart failure)  . Ejection fraction  . History of noncompliance with medical treatment  . Hypertension  . Dyslipidemia  . Dementia  . CAD (coronary artery disease)  . Chest pain  . CKD (chronic kidney disease), stage IV  . Edema  . Aortic insufficiency  . Mitral regurgitation  . Pulmonary hypertension  . Bradycardia  . Fatigue  . Leukocytosis  . Confusion  . Anemia    Past Medical History:  Past Medical History  Diagnosis Date  . Atrial fibrillation     Rapid response, hospital, September, 2012  . CHF (congestive heart failure)     Combined systolic and diastolic, EF 40-45%, 2012  . Ejection fraction     EF 40-45%, echo, September, 2012  . History of noncompliance with medical treatment   . Hypertension   . Dyslipidemia   . Dementia      ? mild ?  . CAD (coronary artery disease)     PCI to LAD early 1990s  . Degenerative joint disease   . Chest pain     Chest wall pain and costochondritis  . CKD (chronic kidney disease), stage IV   . Edema     Left greater than right lower extremity, No DVT by Doppler, September, 2012  . Aortic insufficiency     Mild, echo, September, 2012  . Mitral regurgitation     Moderate, echo, 2012  . Pulmonary hypertension     46 mmHg, echo, 2012  . Hyperlipidemia   . Bradycardia     October, 2012  . Fatigue     October, 2012   Past Surgical History:  Past Surgical History  Procedure Date  . Cardiac catheterization   . Cataract extraction     OT Assessment/Plan/Recommendation OT Assessment Clinical Impression Statement: 78 yo female presenting with AMS with a history of dementia that does not require skilled OT at this time. Pt is able to complete basic ADLS with supervision and setup environment.Pt  does presist with tasks and need verbal cues to terminate task and progress tasks. OT Recommendation/Assessment: Patient does not need any further OT services OT Recommendation Follow Up Recommendations: No OT follow up Equipment Recommended: None recommended by OT Pt could benefit from 24/ 7 Supervision due to cognitive deficits and if family unable to provide recommend SNF level with memory care session. Pt pleasant and cooperative during session.    OT Evaluation Precautions/Restrictions  Precautions Precautions: Fall Restrictions Weight Bearing Restrictions: No Prior Functioning Home Living Lives With: Family Receives Help From: Family Type of Home: House Additional Comments: Pt reports "i live by myself in house" pt poor historian. per chart d/c plan is SNF level - Pt will require memory care level due to cognitive deficits Prior Function Level of Independence: Independent with basic ADLs;Independent with transfers;Independent with gait Driving: No Vocation: Retired ADL ADL Eating/Feeding: Set up;Performed Where Assessed - Eating/Feeding: Bed level Grooming: Performed;Wash/dry hands;Wash/dry face;Teeth care;Supervision/safety (Min v/c- pt repeatly washing same area of body) Grooming Details (indicate cue type and reason): pt completed oral care with great detail to hygiene.  Where Assessed - Grooming: Standing at sink Upper Body Bathing: Performed;Chest;Right arm;Left arm;Abdomen;Supervision/safety Where Assessed - Upper Body Bathing: Standing at sink Lower Body Bathing: Performed;Supervision/safety Where Assessed - Lower Body Bathing: Standing at sink Toilet Transfer: Performed;Supervision/safety  Toilet Transfer Method: Proofreader: Regular height toilet Toileting - Clothing Manipulation: Performed;Supervision/safety Where Assessed - Glass blower/designer Manipulation: Sit to stand from 3-in-1 or toilet Toileting - Hygiene:  Performed;Supervision/safety Where Assessed - Toileting Hygiene: Sit to stand from 3-in-1 or toilet Equipment Used: Other (comment) (hand held assistance 30% of session for balance) Ambulation Related to ADLs: Pt with unsteady gait. pt able to turn head Rt and Lt without LOB. pt reaching for environmental supports unsafely. ADL Comments: Pt completed sink level ADLs. pt provided instructions for oral care. Pt needed repeated instructions at sink level and self initiated. pt provided wash cloth with v/c "wash your face" pt washed face and requested soap. pt began UB bath. Sitter states "you have had a bath" Pt states "oh yeah well you never now" and pt continued to bath UB. Pt required v/c to terminate task. Pt ambulated to restroom and voided bladder. pt then requesting wash cloth to perform hygiene. Pt required v/c to terminated LB bath. Pt ambulated ~170 ft Min guard A. Pt scanning environment throughout walk.  Vision/Perception  Vision - History Baseline Vision: Wears glasses all the time Cognition Cognition Arousal/Alertness: Awake/alert Overall Cognitive Status: History of cognitive impairments History of Cognitive Impairment:  (unknown no family present) Orientation Level: Disoriented to situation;Disoriented to time;Disoriented to place;Oriented to person (unable to state "age") Cognition - Other Comments: Pt aware of name Sensation/Coordination Sensation Light Touch: Appears Intact Hot/Cold: Appears Intact Proprioception: Appears Intact Coordination Gross Motor Movements are Fluid and Coordinated: Yes Fine Motor Movements are Fluid and Coordinated: Yes Extremity Assessment RUE Assessment RUE Assessment: Within Functional Limits LUE Assessment LUE Assessment: Within Functional Limits Mobility  Bed Mobility Bed Mobility: Yes Supine to Sit: 4: Min assist;With rails Supine to Sit Details (indicate cue type and reason): pt with decr problem solving with hosptial bed, pt  demonstrates adequate level for d/c  Transfers Transfers: Yes Sit to Stand: From bed;From toilet;5: Supervision Stand to Sit: To chair/3-in-1;To toilet;5: Supervision Exercises   End of Session OT - End of Session Equipment Utilized During Treatment: Gait belt Activity Tolerance: Patient tolerated treatment well Patient left: in chair;with call bell in reach;Other (comment) (sitter present) Nurse Communication: Mobility status for transfers;Mobility status for ambulation General Behavior During Session: [facility] for tasks performed Cognition: Impaired, at baseline   Lucile Shutters 12/19/2011, 11:22 AM  Pager: [phone]

## 2011-12-19 NOTE — ED Notes (Signed)
Met with pt's daughters for supportive visit.  Both are in agreement for SNF vs ALF placement and both agree that pt will not have 24 hr care if she is d/c home.  Pt applied for Medicaid in September, hopefully approved soon.  Might be candidate for LOG if Medicaid not approved b/f d/c. No money available to private pay. Discussed with family.

## 2011-12-19 NOTE — Progress Notes (Signed)
Subjective: patient Objective: Filed Vitals:   12/18/11 2230 12/19/11 0522 12/19/11 0631 12/19/11 1444  BP: 152/78 190/100 165/86 192/92  Pulse: 64 78 72 70  Temp: 98.2 F (36.8 C) 98.3 F (36.8 C) 98.4 F (36.9 C) 98.2 F (36.8 C)  TempSrc: Oral Oral Oral Oral  Resp: 18 18 18 18   Height:      Weight:      SpO2:  99% 98% 99%   Weight change:   Intake/Output Summary (Last 24 hours) at 12/19/11 1548 Last data filed at 12/19/11 1300  Gross per 24 hour  Intake    480 ml  Output      0 ml  Net    480 ml    General: Alert, awake, oriented x3, in no acute distress.  HEENT: No bruits, no goiter.  Heart: Regular rate and rhythm, without murmurs, rubs, gallops.  Lungs: Crackles left side, bilateral air movement.  Abdomen: Soft, nontender, nondistended, positive bowel sounds.  Neuro: Grossly intact, nonfocal.   Lab Results:  [facility] 12/18/11 0605 12/17/11 1832  NA 141 141  K 3.7 4.3  CL 108 108  CO2 26 27  GLUCOSE 111* 105*  BUN 24* 28*  CREATININE 1.30* 1.41*  CALCIUM 9.1 9.6  MG 2.2 --  PHOS 2.6 --    Basename 12/18/11 0605 12/17/11 1832  AST 15 18  ALT 9 10  ALKPHOS 64 70  BILITOT 0.2* 0.2*  PROT 6.3 6.6  ALBUMIN 2.7* 3.1*   No results found for this basename: LIPASE:2,AMYLASE:2 in the last 72 hours  Basename 12/18/11 0605 12/17/11 1832  WBC 10.0 12.3*  NEUTROABS -- 8.1*  HGB 9.1* 9.8*  HCT 28.1* 30.5*  MCV 94.9 94.7  PLT 249 256   No results found for this basename: CKTOTAL:3,CKMB:3,CKMBINDEX:3,TROPONINI:3 in the last 72 hours No components found with this basename: POCBNP:3 No results found for this basename: DDIMER:2 in the last 72 hours No results found for this basename: HGBA1C:2 in the last 72 hours No results found for this basename: CHOL:2,HDL:2,LDLCALC:2,TRIG:2,CHOLHDL:2,LDLDIRECT:2 in the last 72 hours  Basename 12/18/11 1741  TSH 2.094  T4TOTAL --  T3FREE --  THYROIDAB --    Basename 12/18/11 1741 12/18/11 0605  VITAMINB12 305 231   FOLATE 11.9 8.3  FERRITIN 54 47  TIBC 252 218*  IRON 30* 37*  RETICCTPCT 2.0 1.9    Micro Results: Recent Results (from the past 240 hour(s))  URINE CULTURE     Status: Normal   Collection Time   12/17/11  7:59 PM      Component Value Range Status Comment   Specimen Description URINE, CLEAN CATCH   Final    Special Requests NONE   Final    Setup Time [id number]   Final    Colony Count 40,000 COLONIES/ML   Final    Culture     Final    Value: Multiple bacterial morphotypes present, none predominant. Suggest appropriate recollection if clinically indicated.   Report Status 12/19/2011 FINAL   Final     Studies/Results: Dg Chest 2 View  12/17/2011  *RADIOLOGY REPORT*  Clinical Data: Shortness of breath when walking.  Slight confusion.  CHEST - 2 VIEW  Comparison: 08/12/2011.  Findings: Cardiomegaly.  Calcified tortuous aorta.  Clear lung fields.  No effusion or pneumothorax.  Bones unremarkable. Improved aeration.  IMPRESSION: Cardiomegaly with calcified tortuous aorta.  No acute cardiopulmonary disease.  Original Report Authenticated By: Elsie Stain, M.D.   Ct Head Wo Contrast  12/17/2011  *  RADIOLOGY REPORT*  Clinical Data: Alzheimers insomnia  CT HEAD WITHOUT CONTRAST  Technique:  Contiguous axial images were obtained from the base of the skull through the vertex without contrast.  Comparison: 04/16/2007  Findings: Mild diffuse brain atrophy and periventricular white matter microvascular ischemic changes.  No acute intracranial hemorrhage, definite mass lesion, infarction, midline shift, herniation, hydrocephalus, or extra-axial fluid collection.  Gray- white matter differentiation maintained.  Slight cerebellar atrophy as well.  Symmetric orbits.  Mastoids and sinuses clear.  IMPRESSION: Stable atrophy and microvascular chronic ischemic changes.  No acute process.  Original Report Authenticated By: Judie Petit. Ruel Favors, M.D.    Medications: I have reviewed the patient's current  medications.  Atrial fibrillation () Continue with diltiazem.  Hypertension / Malignant HTN  Continue with clonidine, hydralazine, diltiazem.   Dementia () Patient will likely benefit form placement.  Continue with Namenda.  PT /OT consult.   Bradycardia () Metoprolol stopped. Monitor on diltiazem.   Leukocytosis (12/17/2011) Resolved. Stress related.   Confusion (12/17/2011) Probably worsening dementia.  Chest x ray negative for PNA. UA negative , follow culture.   B12, TSH normal., RPR negative..   Anemia (12/17/2011) HB stable. Iron at 30, ferritin 42, B12 305.  I will start iron and B12 supplement.   Waiting Placement.      LOS: 2 days   Pius Byrom M.D.  [facility] 12/19/2011, 3:48 PM

## 2011-12-19 NOTE — Progress Notes (Signed)
Clinical Social Worker initiated placement search including SNF and ALF with secured Alzheimer's Units. Pt is unsafe to discharge home at this time as pt daughter unable to care for pt at home. Clinical Social Worker to follow up with bed offers and facilitate pt discharge needs when bed available.   Jacklynn Lewis, MSW, LCSWA  Clinical Social Work [phone]

## 2011-12-20 LAB — BASIC METABOLIC PANEL
BUN: 19 mg/dL (ref 6–23)
Calcium: 9.7 mg/dL (ref 8.4–10.5)
Creatinine, Ser: 1.16 mg/dL — ABNORMAL HIGH (ref 0.50–1.10)
GFR calc non Af Amer: 44 mL/min — ABNORMAL LOW (ref >90)
Glucose, Bld: 113 mg/dL — ABNORMAL HIGH (ref 70–99)

## 2011-12-20 LAB — CBC
HCT: 32.4 % — ABNORMAL LOW (ref 36.0–46.0)
Hemoglobin: 10.6 g/dL — ABNORMAL LOW (ref 12.0–15.0)
MCH: 30.4 pg (ref 26.0–34.0)
MCHC: 32.7 g/dL (ref 30.0–36.0)
RDW: 13.8 % (ref 11.5–15.5)

## 2011-12-20 NOTE — Progress Notes (Signed)
Physical Therapy Evaluation Patient Details Name: Kristi [name] MRN: [id number] DOB: 07/06/1931 Today's Date: 12/20/2011  Problem List:  Patient Active Problem List  Diagnoses  . Atrial fibrillation  . CHF (congestive heart failure)  . Ejection fraction  . History of noncompliance with medical treatment  . Hypertension  . Dyslipidemia  . Dementia  . CAD (coronary artery disease)  . Chest pain  . CKD (chronic kidney disease), stage IV  . Edema  . Aortic insufficiency  . Mitral regurgitation  . Pulmonary hypertension  . Bradycardia  . Fatigue  . Leukocytosis  . Confusion  . Anemia    Past Medical History:  Past Medical History  Diagnosis Date  . Atrial fibrillation     Rapid response, hospital, September, 2012  . CHF (congestive heart failure)     Combined systolic and diastolic, EF 40-45%, 2012  . Ejection fraction     EF 40-45%, echo, September, 2012  . History of noncompliance with medical treatment   . Hypertension   . Dyslipidemia   . Dementia      ? mild ?  . CAD (coronary artery disease)     PCI to LAD early 1990s  . Degenerative joint disease   . Chest pain     Chest wall pain and costochondritis  . CKD (chronic kidney disease), stage IV   . Edema     Left greater than right lower extremity, No DVT by Doppler, September, 2012  . Aortic insufficiency     Mild, echo, September, 2012  . Mitral regurgitation     Moderate, echo, 2012  . Pulmonary hypertension     46 mmHg, echo, 2012  . Hyperlipidemia   . Bradycardia     October, 2012  . Fatigue     October, 2012   Past Surgical History:  Past Surgical History  Procedure Date  . Cardiac catheterization   . Cataract extraction     PT Assessment/Plan/Recommendation PT Assessment Clinical Impression Statement: Asked to re-evaluate patient who was evaluated earlier this week and discharged from PT services due to no needs. Per RN unsteady gait in her hand off report. Agree with prior evaluation  and findings. Patients physical abilities are at baseline and require no further PT intervention. Patient does require 24 hour supervision for safety only due to decline in cognition recently. No Skilled PT: Patient at baseline level of functioning;Patient is supervision for all activity/mobility PT Recommendation Follow Up Recommendations: Supervision/Assistance - 24 hour. If family are unable to provide will require long term care at SNF type facility - preferably a memory care unit. Equipment Recommended: None recommended by PT  PT Evaluation Precautions/Restrictions  Precautions Precautions: Fall Precaution Comments: Secondary to cogntive status Restrictions Weight Bearing Restrictions: No Prior Functioning  Home Living Lives With: Family Receives Help From: Family Type of Home: House Prior Function Level of Independence: Independent with transfers;Independent with basic ADLs;Independent with gait Vocation: Retired Comments: Per nursing decline in cognitive function recently leading to difficulty caring for at home. Cognition Cognition Arousal/Alertness: Awake/alert Overall Cognitive Status: History of cognitive impairments History of Cognitive Impairment: Decline in baseline functioning (per RN from discussions with family) Orientation Level: Oriented to person;Disoriented to place;Disoriented to time;Disoriented to situation Sensation/Coordination Sensation Light Touch: Appears Intact Proprioception: Appears Intact Coordination Gross Motor Movements are Fluid and Coordinated: Yes Fine Motor Movements are Fluid and Coordinated: Yes Extremity Assessment RLE Assessment RLE Assessment: Within Functional Limits LLE Assessment LLE Assessment: Within Functional Limits Mobility (including Balance) Transfers Sit  to Stand: 7: Independent;From chair/3-in-1;Without upper extremity assist Stand to Sit: 7: Independent;To chair/3-in-1 Ambulation/Gait Ambulation/Gait Assistance: 5:  Supervision Ambulation/Gait Assistance Details (indicate cue type and reason): Verbal cues to not walk with hnds behind back incase of loss of balance and needs to prevent fall Ambulation Distance (Feet): 600 Feet Assistive device: None Gait Pattern: Within Functional Limits No evidence of imbalance with head turns or 180 degree turns, or with dual task conditions. Posture/Postural Control Posture/Postural Control: Postural limitations Postural Limitations: Increased thoracic kyphosis Static Sitting Balance Static Sitting - Level of Assistance: 7: Independent Static Standing Balance Static Standing - Level of Assistance: 7: Independent End of Session PT - End of Session Equipment Utilized During Treatment: Gait belt Activity Tolerance: Patient tolerated treatment well Patient left: in chair General Behavior During Session: [facility] for tasks performed Cognition: Impaired, at baseline  Edwyna Perfect, PT  Pager [phone] 12/20/2011, 11:14 AM

## 2011-12-20 NOTE — Progress Notes (Signed)
Clinical Social Worker contacted daughter [name] [phone]) and gave her bed offers ([facility]). Ms. Kristi [name] was advised that she would continue to be updated as we receive bed offers. Ms. Kristi [name] also gave her sister [name] number [phone]) as an alternate contact.  Genelle Bal, MSW, LCSW [phone]

## 2011-12-20 NOTE — Progress Notes (Signed)
Subjective: Sitting in chair, no distress, no complaints. Denies diarrhea, cough, dysuria.  Objective: Filed Vitals:   12/19/11 2249 12/20/11 0036 12/20/11 0226 12/20/11 0510  BP: 210/100 184/76 174/72 182/100  Pulse:    88  Temp:    99 F (37.2 C)  TempSrc:    Oral  Resp:    18  Height:      Weight:    50.758 kg (111 lb 14.4 oz)  SpO2:    97%   Weight change:   Intake/Output Summary (Last 24 hours) at 12/20/11 1129 Last data filed at 12/20/11 0805  Gross per 24 hour  Intake    480 ml  Output    177 ml  Net    303 ml    General: Alert, awake, in no acute distress.  HEENT: No bruits, no goiter.  Heart: Regular rate and rhythm, without murmurs, rubs, gallops.  Lungs: Crackles left side, bilateral air movement.  Abdomen: Soft, nontender, nondistended, positive bowel sounds.  Neuro: Grossly intact, nonfocal. Extremities; Trace edema.   Lab Results:  [facility] 12/20/11 0500 12/18/11 0605  NA 140 141  K 3.9 3.7  CL 104 108  CO2 25 26  GLUCOSE 113* 111*  BUN 19 24*  CREATININE 1.16* 1.30*  CALCIUM 9.7 9.1  MG -- 2.2  PHOS -- 2.6    Basename 12/18/11 0605 12/17/11 1832  AST 15 18  ALT 9 10  ALKPHOS 64 70  BILITOT 0.2* 0.2*  PROT 6.3 6.6  ALBUMIN 2.7* 3.1*   No results found for this basename: LIPASE:2,AMYLASE:2 in the last 72 hours  Basename 12/20/11 0500 12/18/11 0605 12/17/11 1832  WBC 12.9* 10.0 --  NEUTROABS -- -- 8.1*  HGB 10.6* 9.1* --  HCT 32.4* 28.1* --  MCV 92.8 94.9 --  PLT 310 249 --    Basename 12/18/11 1741  TSH 2.094  T4TOTAL --  T3FREE --  THYROIDAB --    Basename 12/18/11 1741 12/18/11 0605  VITAMINB12 305 231  FOLATE 11.9 8.3  FERRITIN 54 47  TIBC 252 218*  IRON 30* 37*  RETICCTPCT 2.0 1.9    Micro Results: Recent Results (from the past 240 hour(s))  URINE CULTURE     Status: Normal   Collection Time   12/17/11  7:59 PM      Component Value Range Status Comment   Specimen Description URINE, CLEAN CATCH   Final    Special  Requests NONE   Final    Setup Time [id number]   Final    Colony Count 40,000 COLONIES/ML   Final    Culture     Final    Value: Multiple bacterial morphotypes present, none predominant. Suggest appropriate recollection if clinically indicated.   Report Status 12/19/2011 FINAL   Final     Studies/Results: No results found.  Medications: I have reviewed the patient's current medications. Atrial fibrillation () Continue with diltiazem. Rate controlled.   Hypertension / Malignant HTN  Continue with clonidine, hydralazine, diltiazem.   Dementia () Patient will likely benefit form placement.  Continue with Namenda. i wont start exelon to avoid worsening agitation.    Bradycardia () Metoprolol stopped. Monitor on diltiazem.  Improved.  Leukocytosis (12/17/2011) Increase today , monitor.   Confusion (12/17/2011) Probably worsening dementia.  Chest x ray negative for PNA. UA negative , follow culture.  B12, TSH normal., RPR negative..   Anemia (12/17/2011) HB stable. Iron at 30, ferritin 42, B12 305.  I will start iron and B12 supplement.  Waiting Placement.      LOS: 3 days   Ludwin Flahive M.D.  [facility] 12/20/2011, 11:29 AM

## 2011-12-21 LAB — CBC
MCH: 30.5 pg (ref 26.0–34.0)
MCHC: 32.5 g/dL (ref 30.0–36.0)
Platelets: 315 10*3/uL (ref 150–400)
RDW: 14.1 % (ref 11.5–15.5)

## 2011-12-21 MED ORDER — HYDROCHLOROTHIAZIDE 12.5 MG PO CAPS
12.5000 mg | ORAL_CAPSULE | Freq: Every day | ORAL | Status: DC
  Administered 2011-12-21 – 2011-12-22 (×2): 12.5 mg via ORAL
  Filled 2011-12-21 (×3): qty 1

## 2011-12-21 NOTE — Progress Notes (Signed)
Patient's blood pressures are around 200/99 and patient has been given two prn doses of 10mg  IV hydralazine along with her scheduled 25mg  po doses. Her blood pressure starts trending down and then goes back up. Patient's blood pressure at 0640 was 155/92. Will continue to monitor.

## 2011-12-21 NOTE — Progress Notes (Signed)
Subjective: Patient sitting in chair in no distress. Eating lunch. No acomplaints.   Objective:. Filed Vitals:   12/21/11 0315 12/21/11 0500 12/21/11 0550 12/21/11 0640  BP: 200/98 167/88 201/99 155/92  Pulse: 116 102  99  Temp:  99.5 F (37.5 C)    TempSrc:  Oral    Resp:  18    Height:      Weight:      SpO2:  98%     Weight change:   Intake/Output Summary (Last 24 hours) at 12/21/11 1305 Last data filed at 12/21/11 1240  Gross per 24 hour  Intake    840 ml  Output      0 ml  Net    840 ml    General: Alert, awake, oriented x3, in no acute distress.  HEENT: No bruits, no goiter.  Heart: Regular rate and rhythm, without murmurs, rubs, gallops.  Lungs: CTA bilateral air movement.  Abdomen: Soft, nontender, nondistended, positive bowel sounds.  Neuro: Grossly intact, nonfocal. Extremities no edema  Lab Results:  Basename 12/20/11 0500  NA 140  K 3.9  CL 104  CO2 25  GLUCOSE 113*  BUN 19  CREATININE 1.16*  CALCIUM 9.7  MG --  PHOS --    Basename 12/21/11 0622 12/20/11 0500  WBC 10.6* 12.9*  NEUTROABS -- --  HGB 10.6* 10.6*  HCT 32.6* 32.4*  MCV 93.9 92.8  PLT 315 310   Basename 12/18/11 1741  TSH 2.094  T4TOTAL --  T3FREE --  THYROIDAB --    Basename 12/18/11 1741  VITAMINB12 305  FOLATE 11.9  FERRITIN 54  TIBC 252  IRON 30*  RETICCTPCT 2.0    Micro Results: Recent Results (from the past 240 hour(s))  URINE CULTURE     Status: Normal   Collection Time   12/17/11  7:59 PM      Component Value Range Status Comment   Specimen Description URINE, CLEAN CATCH   Final    Special Requests NONE   Final    Setup Time [id number]   Final    Colony Count 40,000 COLONIES/ML   Final    Culture     Final    Value: Multiple bacterial morphotypes present, none predominant. Suggest appropriate recollection if clinically indicated.   Report Status 12/19/2011 FINAL   Final     Medications: I have reviewed the patient's current medications.  Atrial  fibrillation () Continue with diltiazem.  Rate controlled.  Hypertension / Malignant HTN  Continue with clonidine, hydralazine, diltiazem.  I will add HCTZ.  Dementia () Patient will likely benefit form placement.  Continue with Namenda. i wont start exelon to avoid worsening agitation.   Bradycardia () Metoprolol stopped. Monitor on diltiazem.  Improved.  Leukocytosis (12/17/2011)  Resolved.   Confusion (12/17/2011) Probably worsening dementia.  Chest x ray negative for PNA. UA negative , follow culture.  B12, TSH normal., RPR negative..   Anemia (12/17/2011) HB stable. Iron at 30, ferritin 42, B12 305.  I will start iron and B12 supplement.  Waiting Placement.     LOS: 4 days   Armani Gawlik M.D.  [facility] 12/21/2011, 1:05 PM

## 2011-12-22 LAB — BASIC METABOLIC PANEL
Calcium: 9.4 mg/dL (ref 8.4–10.5)
GFR calc Af Amer: 39 mL/min — ABNORMAL LOW (ref >90)
GFR calc non Af Amer: 33 mL/min — ABNORMAL LOW (ref >90)
Glucose, Bld: 104 mg/dL — ABNORMAL HIGH (ref 70–99)
Sodium: 138 mEq/L (ref 135–145)

## 2011-12-22 LAB — MAGNESIUM: Magnesium: 2.2 mg/dL (ref 1.5–2.5)

## 2011-12-22 MED ORDER — TUBERCULIN PPD 5 UNIT/0.1ML ID SOLN
5.0000 [IU] | Freq: Once | INTRADERMAL | Status: AC
  Administered 2011-12-22: 5 [IU] via INTRADERMAL
  Filled 2011-12-22: qty 0.1

## 2011-12-22 MED ORDER — METOPROLOL SUCCINATE 12.5 MG HALF TABLET
12.5000 mg | ORAL_TABLET | Freq: Every day | ORAL | Status: DC
  Administered 2011-12-22 – 2011-12-23 (×2): 12.5 mg via ORAL
  Filled 2011-12-22 (×3): qty 1

## 2011-12-22 NOTE — Progress Notes (Signed)
Patient had 10 beats of Vtach at 0420.  Please see strip. Will continue to monitor.

## 2011-12-22 NOTE — Progress Notes (Signed)
Rec'd call from [facility] who has family with them and bed has been accepted for tomorrow. Faxing FL2 to them and CSW to f/u in am for further d/c planning.  Reece Levy, MSW, Theresia Majors [phone]

## 2011-12-22 NOTE — Progress Notes (Signed)
Subjective: Patient eating lunch, no complaints Objective: Filed Vitals:   12/22/11 0326 12/22/11 0528 12/22/11 0624 12/22/11 1415  BP: 169/81 188/92 176/78 158/81  Pulse:  89  72  Temp:  98.3 F (36.8 C)  99.2 F (37.3 C)  TempSrc:  Oral  Oral  Resp:  20  20  Height:      Weight:      SpO2:  98%  96%   Weight change:   Intake/Output Summary (Last 24 hours) at 12/22/11 1842 Last data filed at 12/22/11 1238  Gross per 24 hour  Intake    600 ml  Output      0 ml  Net    600 ml    General: Alert, awake, oriented x3, in no acute distress.  HEENT: No bruits, no goiter.  Heart: Regular rate and rhythm, without murmurs, rubs, gallops.  Lungs: Crackles left side, bilateral air movement.  Abdomen: Soft, nontender, nondistended, positive bowel sounds.  Neuro: Grossly intact, nonfocal. Extremities no edema.    Lab Results:  Basename 12/22/11 1120 12/20/11 0500  NA 138 140  K 4.2 3.9  CL 105 104  CO2 25 25  GLUCOSE 104* 113*  BUN 22 19  CREATININE 1.46* 1.16*  CALCIUM 9.4 9.7  MG 2.2 --  PHOS -- --    Basename 12/21/11 0622 12/20/11 0500  WBC 10.6* 12.9*  NEUTROABS -- --  HGB 10.6* 10.6*  HCT 32.6* 32.4*  MCV 93.9 92.8  PLT 315 310   Micro Results: Recent Results (from the past 240 hour(s))  URINE CULTURE     Status: Normal   Collection Time   12/17/11  7:59 PM      Component Value Range Status Comment   Specimen Description URINE, CLEAN CATCH   Final    Special Requests NONE   Final    Setup Time [id number]   Final    Colony Count 40,000 COLONIES/ML   Final    Culture     Final    Value: Multiple bacterial morphotypes present, none predominant. Suggest appropriate recollection if clinically indicated.   Report Status 12/19/2011 FINAL   Final     Studies/Results: No results found.  Medications: I have reviewed the patient's current medications.  Atrial fibrillation () Continue with diltiazem.  Rate controlled.  Hypertension / Malignant HTN    Continue with clonidine, hydralazine, diltiazem.   Dementia () Patient will likely benefit form placement.  Continue with Namenda. i wont start exelon to avoid worsening agitation.   Bradycardia ()  Monitor on diltiazem. Resolved. Add low dose metoprolol due to SVT.  Improved.  Leukocytosis (12/17/2011)  Resolved.   Confusion (12/17/2011) Probably worsening dementia.  Chest x ray negative for PNA. UA negative , follow culture.  B12, TSH normal., RPR negative..   Anemia (12/17/2011) HB stable. Iron at 30, ferritin 42, B12 305.  I will start iron and B12 supplement.  Waiting Placement.    Non SVT: Restart metoprolol. Electrolytes normal.    LOS: 5 days   Anais Koenen M.D.  [facility] 12/22/2011, 6:42 PM

## 2011-12-22 NOTE — Progress Notes (Signed)
CSW is following this Pt to assist with D/C planning.  CSW attempted to call Pt's family.  Of the four numbers listed, only one was in working order [phone]).  CSW left VM at this number in an effort to discuss D/C plan with family.  CSW will continue to follow and assist with D/C as needed.

## 2011-12-23 MED ORDER — TRAMADOL HCL 50 MG PO TABS: 50.0000 mg | ORAL_TABLET | Freq: Four times a day (QID) | ORAL | Status: DC | PRN

## 2011-12-23 MED ORDER — HYDRALAZINE HCL 25 MG PO TABS: 25.0000 mg | ORAL_TABLET | Freq: Three times a day (TID) | ORAL | Status: AC

## 2011-12-23 MED ORDER — VITAMIN B-12 100 MCG PO TABS: 50.0000 ug | ORAL_TABLET | Freq: Every day | ORAL | Status: AC

## 2011-12-23 MED ORDER — METOPROLOL SUCCINATE 12.5 MG HALF TABLET: 12.5000 mg | ORAL_TABLET | Freq: Every day | ORAL | Status: AC

## 2011-12-23 NOTE — Progress Notes (Signed)
Clinical Social Worker facilitated pt discharge needs including contacting facility, family, and arranging ambulance transportation to [facility]. No further social work needs at this times.   Jacklynn Lewis, MSW, LCSWA  Clinical Social Work [phone]

## 2011-12-23 NOTE — Progress Notes (Signed)
   CARE MANAGEMENT NOTE 12/23/2011  Patient:  Kristi [name], Kristi [name]   Account Number:  [id number]  Date Initiated:  12/18/2011  Documentation initiated by:  Letha Cape  Subjective/Objective Assessment:   dx afib  admit- lives with daughter - gay clark [phone] cell.     Action/Plan:   pt/ot eval rec snf   Anticipated DC Date:  12/23/2011   Anticipated DC Plan:  ASSISTED LIVING / REST HOME  In-house referral  Clinical Social Worker      DC Planning Services  CM consult      Choice offered to / List presented to:             Status of service:  Completed, signed off Medicare Important Message given?   (If response is "NO", the following Medicare IM given date fields will be blank) Date Medicare IM given:   Date Additional Medicare IM given:    Discharge Disposition:  ASSISTED LIVING  Per UR Regulation:    Comments:  PCP Andi Devon  12/23/11 15:41 Letha Cape RN, BSN [phone] Patient dc to [facility].  12/18/11 15:14 Letha Cape RN, BSN [phone] patient lives with daughter, gay Chestine Spore [phone].  Daughter states she would like for patient to go to snf for long term because she no longer can take care of patient at home, she has a sick husband and a 18 year old child.  I asked if this is what patient wants, daughter states patient does not know what she wants.  When I spoke with patient she did not seem to comprehend what I was asking her.  I will make referral to CSW. Awaiting pt/ot eval.

## 2011-12-23 NOTE — Discharge Summary (Signed)
Admit date: 12/17/2011 Discharge date: 12/23/2011  Primary Care Physician:  Alva Garnet., MD, MD   Discharge Diagnoses:    Diagnoses Date Noted   . Leukocytosis, reactive.  12/17/2011   . Confusion, likely secondary to worsening Dementia.  12/17/2011   . Anemia iron deficiency and b12.  12/17/2011   . Bradycardia, resolved.    . Atrial fibrillation    . Hypertension    . Dementia          Renal Insuffiency           DISCHARGE MEDICATION: Medication List  As of 12/23/2011  8:48 AM   TAKE these medications         cloNIDine 0.2 mg/24hr patch   Commonly known as: CATAPRES - Dosed in mg/24 hr   Place 1 patch onto the skin once a week.      diltiazem 180 MG 24 hr capsule   Commonly known as: CARDIZEM CD   Take 180 mg by mouth daily.      ferrous sulfate 325 (65 FE) MG tablet   Take 325 mg by mouth daily with breakfast.      hydrALAZINE 25 MG tablet   Commonly known as: APRESOLINE   Take 1 tablet (25 mg total) by mouth every 8 (eight) hours.      memantine 5 MG tablet   Commonly known as: NAMENDA   Take 5 mg by mouth 2 (two) times daily.      metoprolol succinate 12.5 mg Tb24   Commonly known as: TOPROL-XL   Take 0.5 tablets (12.5 mg total) by mouth daily.      multivitamin tablet   Take 1 tablet by mouth daily.      traMADol 50 MG tablet   Commonly known as: ULTRAM   Take 1 tablet (50 mg total) by mouth every 6 (six) hours as needed.      vitamin B-12 100 MCG tablet   Commonly known as: CYANOCOBALAMIN   Take 0.5 tablets (50 mcg total) by mouth daily.              Consults:  Psychiatric   SIGNIFICANT DIAGNOSTIC STUDIES:  Dg Chest 2 View  12/17/2011  *RADIOLOGY REPORT*  Clinical Data: Shortness of breath when walking.  Slight confusion.  CHEST - 2 VIEW  Comparison: 08/12/2011.  Findings: Cardiomegaly.  Calcified tortuous aorta.  Clear lung fields.  No effusion or pneumothorax.  Bones unremarkable. Improved aeration.  IMPRESSION: Cardiomegaly with  calcified tortuous aorta.  No acute cardiopulmonary disease.  Original Report Authenticated By: Elsie Stain, M.D.   Ct Head Wo Contrast  12/17/2011  *RADIOLOGY REPORT*  Clinical Data: Alzheimers insomnia  CT HEAD WITHOUT CONTRAST  Technique:  Contiguous axial images were obtained from the base of the skull through the vertex without contrast.  Comparison: 04/16/2007  Findings: Mild diffuse brain atrophy and periventricular white matter microvascular ischemic changes.  No acute intracranial hemorrhage, definite mass lesion, infarction, midline shift, herniation, hydrocephalus, or extra-axial fluid collection.  Gray- white matter differentiation maintained.  Slight cerebellar atrophy as well.  Symmetric orbits.  Mastoids and sinuses clear.  IMPRESSION: Stable atrophy and microvascular chronic ischemic changes.  No acute process.  Original Report Authenticated By: Judie Petit. Ruel Favors, M.D.     Recent Results (from the past 240 hour(s))  URINE CULTURE     Status: Normal   Collection Time   12/17/11  7:59 PM      Component Value Range Status Comment   Specimen Description  URINE, CLEAN CATCH   Final    Special Requests NONE   Final    Setup Time [id number]   Final    Colony Count 40,000 COLONIES/ML   Final    Culture     Final    Value: Multiple bacterial morphotypes present, none predominant. Suggest appropriate recollection if clinically indicated.   Report Status 12/19/2011 FINAL   Final     BRIEF ADMITTING H & P:  Kristi [name] is a 78 y.o. female  has a past medical history of Atrial fibrillation; CHF (congestive heart failure); Ejection fraction; History of noncompliance with medical treatment; Hypertension; Dyslipidemia; Dementia; CAD (coronary artery disease); Degenerative joint disease; Chest pain; CKD (chronic kidney disease), stage IV; Edema; Aortic insufficiency; Mitral regurgitation; Pulmonary hypertension; Hyperlipidemia; Bradycardia; and Fatigue.  Presented with  Gradual onset  of worsening confusion, wandering off, aggressive behavior over the past few years. Recently waked away from her home poorly dressed in the cold weather. She moved in with her children on christmas but she continued to attempt to walk outside. Patient have had poor appetite, Decreased PO intake. Denies chest pain or Shortness of breath. No fever no chills. In September she was supposed to be placed at [facility] but family states something did not work out with the paperwork.  Patient was brought in today because her family could not leave her alone and had to come to the hospital for their own care. She became confused and combative while at short stay and was referred to ER. At ER social worker has been notified of her case. She was found to by slightly bradycardic to 50's with severe HTN with SBP up to 190's and having leukocytosis for unknown reason. Hospitalist was called for [facility] Course:  Confusion (12/17/2011) Patient presents with output gradual onset of worsening confusion, wandering off, aggressive behavior. Patient had a workup for infection with a negative chest x-ray. Negative urinalysis , no significant growth of bacteria in UA . B12 was low normal she will receive some supplement. TSH was normal RPR was negative. Patient family unable to take care of the patient. Psych was consulted they recommend Alzheimer's unit. Patient has not demonstrated any aggressive behavior while in the hospital. Her presentation is likely worsening dementia.   Atrial fibrillation () Continue with diltiazem.  Rate controlled.  Hypertension / Malignant HTN  Continue with clonidine, hydralazine, diltiazem. Hydrochlorothiazide was added but  this was a stop  due to mild increase in creatinine. she would need a Bmet to follow kidney function.   Dementia ()  Continue with Namenda. i wont start exelon to avoid worsening agitation.   Bradycardia () Patient presents with bradycardia heart rate in  the 50. Her metoprolol was initially on hold. I restarted low dose metoprolol due to   episode of nonsustained V. Tach. Patient heart rate in the 60, asymptomatic.   Leukocytosis (12/17/2011) probably a stress related.  Resolved.    Anemia (12/17/2011) HB stable. Iron at 30, ferritin 42, B12 305.  I will start iron and B12 supplement.   Non SVT: Restart metoprolol. Electrolytes normal.   The date of discharge patient was in a stable condition.   Disposition and Follow-up: Patient will need Bmet to follow renal function.  Discharge Orders    Future Orders Please Complete By Expires   Diet - low sodium heart healthy      Increase activity slowly        Follow-up Information    Follow  up with Alva Garnet., MD .          DISCHARGE EXAM:  General: Alert, awake, oriented x3, in no acute distress.  HEENT: No bruits, no goiter.  Heart: Regular rate and rhythm, without murmurs, rubs, gallops.  Lungs: CTA bilateral air movement.  Abdomen: Soft, nontender, nondistended, positive bowel sounds.  Neuro: Grossly intact, nonfocal.  Extremities no edema   Blood pressure 163/83, pulse 61, temperature 98.5 F (36.9 C), temperature source Oral, resp. rate 18, height 5\' 2"  (1.575 m), weight 50.758 kg (111 lb 14.4 oz), SpO2 95.00%.   Basename 12/22/11 1120  NA 138  K 4.2  CL 105  CO2 25  GLUCOSE 104*  BUN 22  CREATININE 1.46*  CALCIUM 9.4  MG 2.2  PHOS --   Basename 12/21/11 0622  WBC 10.6*  NEUTROABS --  HGB 10.6*  HCT 32.6*  MCV 93.9  PLT 315    Signed: Traeh Milroy M.D. 12/23/2011, 8:48 AM

## 2014-11-15 ENCOUNTER — Inpatient Hospital Stay (HOSPITAL_COMMUNITY)
Admission: EM | Admit: 2014-11-15 | Discharge: 2014-11-28 | DRG: 853 | Disposition: A | Discharge status: Skilled Nursing Facility | Payer: Medicare Other | Attending: Internal Medicine | Admitting: Internal Medicine

## 2014-11-15 ENCOUNTER — Emergency Department (HOSPITAL_COMMUNITY): Payer: Medicare Other

## 2014-11-15 ENCOUNTER — Encounter (HOSPITAL_COMMUNITY): Payer: Self-pay | Admitting: Emergency Medicine

## 2014-11-15 DIAGNOSIS — I5032 Chronic diastolic (congestive) heart failure: Secondary | ICD-10-CM | POA: Diagnosis present

## 2014-11-15 DIAGNOSIS — I351 Nonrheumatic aortic (valve) insufficiency: Secondary | ICD-10-CM | POA: Diagnosis not present

## 2014-11-15 DIAGNOSIS — D72829 Elevated white blood cell count, unspecified: Secondary | ICD-10-CM | POA: Diagnosis not present

## 2014-11-15 DIAGNOSIS — E870 Hyperosmolality and hypernatremia: Secondary | ICD-10-CM | POA: Diagnosis present

## 2014-11-15 DIAGNOSIS — I129 Hypertensive chronic kidney disease with stage 1 through stage 4 chronic kidney disease, or unspecified chronic kidney disease: Secondary | ICD-10-CM | POA: Diagnosis present

## 2014-11-15 DIAGNOSIS — Z87891 Personal history of nicotine dependence: Secondary | ICD-10-CM

## 2014-11-15 DIAGNOSIS — N184 Chronic kidney disease, stage 4 (severe): Secondary | ICD-10-CM | POA: Diagnosis present

## 2014-11-15 DIAGNOSIS — K224 Dyskinesia of esophagus: Secondary | ICD-10-CM | POA: Diagnosis present

## 2014-11-15 DIAGNOSIS — A419 Sepsis, unspecified organism: Principal | ICD-10-CM | POA: Diagnosis present

## 2014-11-15 DIAGNOSIS — R627 Adult failure to thrive: Secondary | ICD-10-CM | POA: Diagnosis present

## 2014-11-15 DIAGNOSIS — I340 Nonrheumatic mitral (valve) insufficiency: Secondary | ICD-10-CM | POA: Diagnosis present

## 2014-11-15 DIAGNOSIS — K659 Peritonitis, unspecified: Secondary | ICD-10-CM | POA: Diagnosis present

## 2014-11-15 DIAGNOSIS — M1990 Unspecified osteoarthritis, unspecified site: Secondary | ICD-10-CM | POA: Diagnosis present

## 2014-11-15 DIAGNOSIS — K439 Ventral hernia without obstruction or gangrene: Secondary | ICD-10-CM

## 2014-11-15 DIAGNOSIS — I272 Other secondary pulmonary hypertension: Secondary | ICD-10-CM | POA: Diagnosis present

## 2014-11-15 DIAGNOSIS — I2510 Atherosclerotic heart disease of native coronary artery without angina pectoris: Secondary | ICD-10-CM | POA: Diagnosis not present

## 2014-11-15 DIAGNOSIS — R6510 Systemic inflammatory response syndrome (SIRS) of non-infectious origin without acute organ dysfunction: Secondary | ICD-10-CM | POA: Diagnosis present

## 2014-11-15 DIAGNOSIS — D649 Anemia, unspecified: Secondary | ICD-10-CM | POA: Diagnosis not present

## 2014-11-15 DIAGNOSIS — E876 Hypokalemia: Secondary | ICD-10-CM | POA: Diagnosis not present

## 2014-11-15 DIAGNOSIS — R0602 Shortness of breath: Secondary | ICD-10-CM

## 2014-11-15 DIAGNOSIS — J690 Pneumonitis due to inhalation of food and vomit: Secondary | ICD-10-CM

## 2014-11-15 DIAGNOSIS — R1310 Dysphagia, unspecified: Secondary | ICD-10-CM | POA: Diagnosis not present

## 2014-11-15 DIAGNOSIS — I10 Essential (primary) hypertension: Secondary | ICD-10-CM | POA: Diagnosis not present

## 2014-11-15 DIAGNOSIS — E785 Hyperlipidemia, unspecified: Secondary | ICD-10-CM | POA: Diagnosis present

## 2014-11-15 DIAGNOSIS — Z955 Presence of coronary angioplasty implant and graft: Secondary | ICD-10-CM | POA: Diagnosis not present

## 2014-11-15 DIAGNOSIS — Z66 Do not resuscitate: Secondary | ICD-10-CM

## 2014-11-15 DIAGNOSIS — N179 Acute kidney failure, unspecified: Secondary | ICD-10-CM | POA: Diagnosis not present

## 2014-11-15 DIAGNOSIS — I509 Heart failure, unspecified: Secondary | ICD-10-CM

## 2014-11-15 DIAGNOSIS — Z9889 Other specified postprocedural states: Secondary | ICD-10-CM

## 2014-11-15 DIAGNOSIS — E860 Dehydration: Secondary | ICD-10-CM | POA: Diagnosis not present

## 2014-11-15 DIAGNOSIS — I482 Chronic atrial fibrillation: Secondary | ICD-10-CM | POA: Diagnosis not present

## 2014-11-15 DIAGNOSIS — Z9119 Patient's noncompliance with other medical treatment and regimen: Secondary | ICD-10-CM | POA: Diagnosis present

## 2014-11-15 DIAGNOSIS — Y95 Nosocomial condition: Secondary | ICD-10-CM | POA: Diagnosis not present

## 2014-11-15 DIAGNOSIS — I4891 Unspecified atrial fibrillation: Secondary | ICD-10-CM | POA: Diagnosis not present

## 2014-11-15 DIAGNOSIS — I480 Paroxysmal atrial fibrillation: Secondary | ICD-10-CM

## 2014-11-15 DIAGNOSIS — R251 Tremor, unspecified: Secondary | ICD-10-CM

## 2014-11-15 DIAGNOSIS — Z515 Encounter for palliative care: Secondary | ICD-10-CM | POA: Diagnosis not present

## 2014-11-15 DIAGNOSIS — F0390 Unspecified dementia without behavioral disturbance: Secondary | ICD-10-CM | POA: Diagnosis present

## 2014-11-15 DIAGNOSIS — R531 Weakness: Secondary | ICD-10-CM

## 2014-11-15 DIAGNOSIS — R109 Unspecified abdominal pain: Secondary | ICD-10-CM

## 2014-11-15 DIAGNOSIS — K668 Other specified disorders of peritoneum: Secondary | ICD-10-CM

## 2014-11-15 DIAGNOSIS — N189 Chronic kidney disease, unspecified: Secondary | ICD-10-CM

## 2014-11-15 LAB — COMPREHENSIVE METABOLIC PANEL
ALBUMIN: 3.1 g/dL — AB (ref 3.5–5.2)
ALT: 6 U/L (ref 0–35)
ANION GAP: 15 (ref 5–15)
AST: 14 U/L (ref 0–37)
Alkaline Phosphatase: 90 U/L (ref 39–117)
BILIRUBIN TOTAL: 0.2 mg/dL — AB (ref 0.3–1.2)
BUN: 38 mg/dL — AB (ref 6–23)
CALCIUM: 9.8 mg/dL (ref 8.4–10.5)
CO2: 25 mEq/L (ref 19–32)
CREATININE: 1.93 mg/dL — AB (ref 0.50–1.10)
Chloride: 101 mEq/L (ref 96–112)
GFR calc Af Amer: 27 mL/min — ABNORMAL LOW (ref >90)
GFR calc non Af Amer: 23 mL/min — ABNORMAL LOW (ref >90)
Glucose, Bld: 111 mg/dL — ABNORMAL HIGH (ref 70–99)
Potassium: 4.5 mEq/L (ref 3.7–5.3)
Sodium: 141 mEq/L (ref 137–147)
TOTAL PROTEIN: 7.5 g/dL (ref 6.0–8.3)

## 2014-11-15 LAB — CBC WITH DIFFERENTIAL/PLATELET
BASOS ABS: 0 10*3/uL (ref 0.0–0.1)
BASOS PCT: 0 % (ref 0–1)
EOS ABS: 0 10*3/uL (ref 0.0–0.7)
Eosinophils Relative: 0 % (ref 0–5)
HEMATOCRIT: 33.9 % — AB (ref 36.0–46.0)
HEMOGLOBIN: 10.4 g/dL — AB (ref 12.0–15.0)
Lymphocytes Relative: 7 % — ABNORMAL LOW (ref 12–46)
Lymphs Abs: 1.8 10*3/uL (ref 0.7–4.0)
MCH: 30.6 pg (ref 26.0–34.0)
MCHC: 30.7 g/dL (ref 30.0–36.0)
MCV: 99.7 fL (ref 78.0–100.0)
MONO ABS: 1.2 10*3/uL — AB (ref 0.1–1.0)
MONOS PCT: 5 % (ref 3–12)
Neutro Abs: 22.5 10*3/uL — ABNORMAL HIGH (ref 1.7–7.7)
Neutrophils Relative %: 88 % — ABNORMAL HIGH (ref 43–77)
Platelets: 256 10*3/uL (ref 150–400)
RBC: 3.4 MIL/uL — ABNORMAL LOW (ref 3.87–5.11)
RDW: 13.4 % (ref 11.5–15.5)
WBC: 25.4 10*3/uL — ABNORMAL HIGH (ref 4.0–10.5)

## 2014-11-15 LAB — URINALYSIS, ROUTINE W REFLEX MICROSCOPIC
Bilirubin Urine: NEGATIVE
Glucose, UA: NEGATIVE mg/dL
Hgb urine dipstick: NEGATIVE
KETONES UR: NEGATIVE mg/dL
LEUKOCYTES UA: NEGATIVE
NITRITE: NEGATIVE
PH: 5.5 (ref 5.0–8.0)
Protein, ur: 100 mg/dL — AB
SPECIFIC GRAVITY, URINE: 1.013 (ref 1.005–1.030)
Urobilinogen, UA: 1 mg/dL (ref 0.0–1.0)

## 2014-11-15 LAB — URINE MICROSCOPIC-ADD ON

## 2014-11-15 LAB — LACTIC ACID, PLASMA: Lactic Acid, Venous: 0.9 mmol/L (ref 0.5–2.2)

## 2014-11-15 MED ORDER — SODIUM CHLORIDE 0.9 % IV SOLN
INTRAVENOUS | Status: AC
  Administered 2014-11-15: 21:00:00 via INTRAVENOUS

## 2014-11-15 MED ORDER — HYDRALAZINE HCL 20 MG/ML IJ SOLN
5.0000 mg | Freq: Four times a day (QID) | INTRAMUSCULAR | Status: DC | PRN
  Administered 2014-11-15 – 2014-11-22 (×8): 5 mg via INTRAVENOUS
  Filled 2014-11-15 (×7): qty 1

## 2014-11-15 MED ORDER — PIPERACILLIN-TAZOBACTAM 3.375 G IVPB 30 MIN
3.3750 g | INTRAVENOUS | Status: AC
  Administered 2014-11-15: 3.375 g via INTRAVENOUS
  Filled 2014-11-15: qty 50

## 2014-11-15 MED ORDER — LATANOPROST 0.005 % OP SOLN
1.0000 [drp] | Freq: Every day | OPHTHALMIC | Status: DC
  Administered 2014-11-15 – 2014-11-27 (×12): 1 [drp] via OPHTHALMIC
  Filled 2014-11-15 (×2): qty 2.5

## 2014-11-15 MED ORDER — ONDANSETRON HCL 4 MG/2ML IJ SOLN
4.0000 mg | Freq: Four times a day (QID) | INTRAMUSCULAR | Status: DC | PRN
  Administered 2014-11-17 – 2014-11-18 (×2): 4 mg via INTRAVENOUS
  Filled 2014-11-15 (×2): qty 2

## 2014-11-15 MED ORDER — PIPERACILLIN-TAZOBACTAM 3.375 G IVPB
3.3750 g | Freq: Three times a day (TID) | INTRAVENOUS | Status: DC
  Administered 2014-11-16 – 2014-11-20 (×14): 3.375 g via INTRAVENOUS
  Filled 2014-11-15 (×15): qty 50

## 2014-11-15 MED ORDER — HYDROMORPHONE HCL 1 MG/ML IJ SOLN: 0.5000 mg | INTRAMUSCULAR | Status: DC | PRN

## 2014-11-15 MED ORDER — ONDANSETRON HCL 4 MG PO TABS: 4.0000 mg | ORAL_TABLET | Freq: Four times a day (QID) | ORAL | Status: DC | PRN

## 2014-11-15 MED ORDER — SODIUM CHLORIDE 0.9 % IV BOLUS (SEPSIS)
500.0000 mL | Freq: Once | INTRAVENOUS | Status: AC
  Administered 2014-11-15: 500 mL via INTRAVENOUS

## 2014-11-15 MED ORDER — ENOXAPARIN SODIUM 30 MG/0.3ML ~~LOC~~ SOLN
30.0000 mg | SUBCUTANEOUS | Status: DC
  Administered 2014-11-15 – 2014-11-19 (×5): 30 mg via SUBCUTANEOUS
  Filled 2014-11-15 (×6): qty 0.3

## 2014-11-15 MED ORDER — ACETAMINOPHEN 325 MG PO TABS: 650.0000 mg | ORAL_TABLET | Freq: Four times a day (QID) | ORAL | Status: DC | PRN

## 2014-11-15 MED ORDER — VANCOMYCIN HCL IN DEXTROSE 1-5 GM/200ML-% IV SOLN
1000.0000 mg | INTRAVENOUS | Status: AC
  Administered 2014-11-15: 1000 mg via INTRAVENOUS
  Filled 2014-11-15: qty 200

## 2014-11-15 MED ORDER — ACETAMINOPHEN 650 MG RE SUPP
650.0000 mg | Freq: Four times a day (QID) | RECTAL | Status: DC | PRN
Start: 2014-11-15 — End: 2014-11-16
  Administered 2014-11-15: 650 mg via RECTAL
  Filled 2014-11-15: qty 1

## 2014-11-15 MED ORDER — VANCOMYCIN HCL 500 MG IV SOLR
500.0000 mg | INTRAVENOUS | Status: DC
  Administered 2014-11-16: 500 mg via INTRAVENOUS
  Filled 2014-11-15 (×2): qty 500

## 2014-11-15 NOTE — ED Notes (Signed)
Pt BIB EMS. [facility]. Pt developed shivering and shaking after eating lunch. Staff concerned about this and called EMS. Pt alert to baseline. Skin warm, dry. No acute distress.

## 2014-11-15 NOTE — Progress Notes (Signed)
ANTIBIOTIC CONSULT NOTE - INITIAL  Pharmacy Consult for Vancomycin and Zosyn Indication: rule out sepsis  Allergies  Allergen Reactions  . Atenolol Other (See Comments)    unknown  . Ramipril Cough  . Simvastatin Other (See Comments)    unknown    Patient Measurements:   Height: 61 inches Weight: 52.7 kg  Vital Signs: Temp: 101.2 F (38.4 C) (12/16 1646) Temp Source: Rectal (12/16 1646) BP: 150/93 mmHg (12/16 1849) Pulse Rate: 67 (12/16 1849) Intake/Output from previous day:   Intake/Output from this shift:    Labs:  Recent Labs  11/15/14 1729  WBC 25.4*  HGB 10.4*  PLT 256  CREATININE 1.93*   CrCl cannot be calculated (Unknown ideal weight.). No results for input(s): VANCOTROUGH, VANCOPEAK, VANCORANDOM, GENTTROUGH, GENTPEAK, GENTRANDOM, TOBRATROUGH, TOBRAPEAK, TOBRARND, AMIKACINPEAK, AMIKACINTROU, AMIKACIN in the last 72 hours.   Microbiology: No results found for this or any previous visit (from the past 720 hour(s)).  Medical History: Past Medical History  Diagnosis Date  . Atrial fibrillation     Rapid response, hospital, September, 2012  . CHF (congestive heart failure)     Combined systolic and diastolic, EF 40-45%, 2012  . Ejection fraction     EF 40-45%, echo, September, 2012  . History of noncompliance with medical treatment   . Hypertension   . Dyslipidemia   . Dementia      ? mild ?  . CAD (coronary artery disease)     PCI to LAD early 1990s  . Degenerative joint disease   . Chest pain     Chest wall pain and costochondritis  . CKD (chronic kidney disease), stage IV   . Edema     Left greater than right lower extremity, No DVT by Doppler, September, 2012  . Aortic insufficiency     Mild, echo, September, 2012  . Mitral regurgitation     Moderate, echo, 2012  . Pulmonary hypertension     46 mmHg, echo, 2012  . Hyperlipidemia   . Bradycardia     October, 2012  . Fatigue     October, 2012    Medications:  Scheduled:    Infusions:  . piperacillin-tazobactam    . sodium chloride    . vancomycin     PRN:   Assessment: 80 yo female with dementia presents from nursing facility for shaking and shivering. Pharmacy is consulted to dose vancomycin and zosyn for r/o sepsis.  12/16 >> Vancomycin >> 12/16 >> Zosyn >>  Tmax: 101.2 WBC: 25.4k Renal: SCr 1.93, CrCl ~25 ml/min  12/16 blood x2:  Goal of Therapy:  Vancomycin trough level 15-20 mcg/ml  Zosyn dose appropriate for indication, renal function  Plan:   Vancomycin 1g IV x 1, then 500mg  IV q24h Check trough at steady state Zosyn 3.375gm IV q8h (4hr extended infusions) Follow up renal function & cultures, clinical course  [name], PharmD, BCPS Pager: [phone] 11/15/2014,7:09 PM

## 2014-11-15 NOTE — ED Notes (Signed)
Bed: WA08 Expected date:  Expected time:  Means of arrival:  Comments: EMS-SHIVERRRRRING

## 2014-11-15 NOTE — ED Notes (Signed)
Report given to French Anaracy, [facility] fund[facility].

## 2014-11-15 NOTE — H&P (Signed)
[facility] Admission History and Physical       LAVEAH GLOSTER ZOX:[id number] DOB: [birth year] DOA: 11/15/2014  Referring physician: EDP PCP: Alva Garnet., MD  Specialists:   Chief Complaint: Shaking and Chills  HPI: Kristi [name] is a 80 y.o. female with a history of CAD, A.fib, CHF, HTN, and Dementia residing currently at the [facility] who was sent tot he ED after being observed by staff to complain of chills and shivering  During the afternoon after eating her lunch.    She was found to have a rectal temperature of 101.2 in the ED, and a sepsis workup was initiated and she was placed on empiric IV Vancomycin and Zosyn.    Patient has a history of Dementia and is unable to give a history.   Per Staff at the [facility] house she is able to walk and feed herself.      Review of Systems: Unable to Obtain from the Patient  Past Medical History  Diagnosis Date  . Atrial fibrillation     Rapid response, hospital, September, 2012  . CHF (congestive heart failure)     Combined systolic and diastolic, EF 40-45%, 2012  . Ejection fraction     EF 40-45%, echo, September, 2012  . History of noncompliance with medical treatment   . Hypertension   . Dyslipidemia   . Dementia      ? mild ?  . CAD (coronary artery disease)     PCI to LAD early 1990s  . Degenerative joint disease   . Chest pain     Chest wall pain and costochondritis  . CKD (chronic kidney disease), stage IV   . Edema     Left greater than right lower extremity, No DVT by Doppler, September, 2012  . Aortic insufficiency     Mild, echo, September, 2012  . Mitral regurgitation     Moderate, echo, 2012  . Pulmonary hypertension     46 mmHg, echo, 2012  . Hyperlipidemia   . Bradycardia     October, 2012  . Fatigue     October, 2012      Past Surgical History  Procedure Laterality Date  . Cardiac catheterization    . Cataract extraction         Prior to Admission medications    Medication Sig Start Date End Date Taking? Authorizing Provider  acetaminophen (TYLENOL) 500 MG tablet Take 1,000 mg by mouth every 4 (four) hours as needed for moderate pain, fever or headache.   Yes Historical Provider, MD  alum & mag hydroxide-simeth (GERI-LANTA) 200-200-20 MG/5ML suspension Take 30 mLs by mouth every 6 (six) hours as needed for indigestion or heartburn.   Yes Historical Provider, MD  amLODipine (NORVASC) 10 MG tablet Take 10 mg by mouth every morning.   Yes Historical Provider, MD  cloNIDine (CATAPRES - DOSED IN MG/24 HR) 0.3 mg/24hr patch Place 0.3 mg onto the skin every Wednesday.   Yes Historical Provider, MD  donepezil (ARICEPT) 10 MG tablet Take 10 mg by mouth at bedtime.   Yes Historical Provider, MD  ferrous sulfate 325 (65 FE) MG tablet Take 325 mg by mouth daily with breakfast.     Yes Historical Provider, MD  furosemide (LASIX) 20 MG tablet Take 20 mg by mouth every morning.   Yes Historical Provider, MD  gabapentin (NEURONTIN) 300 MG capsule Take 300 mg by mouth at bedtime.   Yes Historical Provider, MD  guaifenesin (ROBITUSSIN) 100 MG/5ML syrup  Take 200 mg by mouth 4 (four) times daily as needed for cough.   Yes Historical Provider, MD  hydrALAZINE (APRESOLINE) 25 MG tablet Take 25 mg by mouth 3 (three) times daily.   Yes Historical Provider, MD  hydrochlorothiazide (MICROZIDE) 12.5 MG capsule Take 12.5 mg by mouth every morning.   Yes Historical Provider, MD  HYDROcodone-acetaminophen (NORCO) 7.5-325 MG per tablet Take 1 tablet by mouth 4 (four) times daily.   Yes Historical Provider, MD  latanoprost (XALATAN) 0.005 % ophthalmic solution Place 1 drop into both eyes at bedtime.   Yes Historical Provider, MD  loperamide (IMODIUM) 2 MG capsule Take 2 mg by mouth as needed for diarrhea or loose stools.   Yes Historical Provider, MD  magnesium hydroxide (MILK OF MAGNESIA) 400 MG/5ML suspension Take 30 mLs by mouth at bedtime as needed for mild constipation.   Yes  Historical Provider, MD  meloxicam (MOBIC) 7.5 MG tablet Take 7.5 mg by mouth every morning.   Yes Historical Provider, MD  Memantine HCl ER (NAMENDA XR) 14 MG CP24 Take 14 mg by mouth every morning.   Yes Historical Provider, MD  potassium chloride (K-DUR) 10 MEQ tablet Take 10 mEq by mouth every morning.   Yes Historical Provider, MD  Vitamin D, Ergocalciferol, (DRISDOL) 50000 UNITS CAPS capsule Take 50,000 Units by mouth every Wednesday.   Yes Historical Provider, MD  hydrALAZINE (APRESOLINE) 25 MG tablet Take 1 tablet (25 mg total) by mouth every 8 (eight) hours. 12/23/11 12/22/12  Belkys A Regalado, MD  metoprolol succinate (TOPROL-XL) 12.5 mg TB24 Take 0.5 tablets (12.5 mg total) by mouth daily. Patient not taking: Reported on 11/15/2014 12/23/11   Belkys A Regalado, MD  traMADol (ULTRAM) 50 MG tablet Take 1 tablet (50 mg total) by mouth every 6 (six) hours as needed. Patient not taking: Reported on 11/15/2014 12/23/11   Alba CoryBelkys A Regalado, MD      Allergies  Allergen Reactions  . Atenolol Other (See Comments)    unknown  . Ramipril Cough  . Simvastatin Other (See Comments)    unknown     Social History:  reports that she has quit smoking. She has never used smokeless tobacco. She reports that she does not drink alcohol or use illicit drugs.     Family History  Problem Relation Age of Onset  . Pneumonia Mother     died  . Other Father     died on a fire  . Coronary artery disease Brother     2 brothers       Physical Exam:  GEN:  Pleasant and Confused Elderly Thin 80 y.o. African American female examined  and in no acute distress; cooperative with exam Filed Vitals:   11/15/14 1547 11/15/14 1646 11/15/14 1849 11/15/14 1910  BP: 137/64  150/93   Pulse: 61  67   Temp: 99.8 F (37.7 C) 101.2 F (38.4 C)    TempSrc: Oral Rectal    Resp: 16  21   Weight:    52.617 kg (116 lb)  SpO2: 96%  98%    Blood pressure 150/93, pulse 67, temperature 101.2 F (38.4 C),  temperature source Rectal, resp. rate 21, weight 52.617 kg (116 lb), SpO2 98 %. PSYCH: She is alert and oriented x1;   HEENT: Normocephalic and Atraumatic, Mucous membranes pink; PERRLA; EOM intact; Fundi:  Benign;  No scleral icterus, Nares: Patent, Oropharynx: Clear, Poor Dentition,    Neck:  FROM, No Cervical Lymphadenopathy nor Thyromegaly or Carotid Bruit;  No JVD; Breasts:: Not examined CHEST WALL: No tenderness CHEST: Normal respiration, clear to auscultation bilaterally HEART: Regular rate and rhythm; no murmurs rubs or gallops BACK: No kyphosis or scoliosis; No CVA tenderness ABDOMEN: Positive Bowel Sounds, Scaphoid , Soft Non-Tender; No Masses, No Organomegaly. Rectal Exam: Not done EXTREMITIES: No Cyanosis, Clubbing, or Edema; No Ulcerations. Genitalia: not examined PULSES: 2+ and symmetric SKIN: Normal hydration no rash or ulceration CNS:  A x O x 1,  Cooperative, Able to follow Simple commands,  Able to Move All 4 Exts Vascular: pulses palpable throughout    Labs on Admission:  Basic Metabolic Panel:  Recent Labs Lab 11/15/14 1729  NA 141  K 4.5  CL 101  CO2 25  GLUCOSE 111*  BUN 38*  CREATININE 1.93*  CALCIUM 9.8   Liver Function Tests:  Recent Labs Lab 11/15/14 1729  AST 14  ALT 6  ALKPHOS 90  BILITOT 0.2*  PROT 7.5  ALBUMIN 3.1*   No results for input(s): LIPASE, AMYLASE in the last 168 hours. No results for input(s): AMMONIA in the last 168 hours. CBC:  Recent Labs Lab 11/15/14 1729  WBC 25.4*  NEUTROABS 22.5*  HGB 10.4*  HCT 33.9*  MCV 99.7  PLT 256   Cardiac Enzymes: No results for input(s): CKTOTAL, CKMB, CKMBINDEX, TROPONINI in the last 168 hours.  BNP (last 3 results) No results for input(s): PROBNP in the last 8760 hours. CBG: No results for input(s): GLUCAP in the last 168 hours.  Radiological Exams on Admission: Dg Chest 2 View  11/15/2014   CLINICAL DATA:  80 year old female with abnormal shaking, chills. Initial  encounter.  EXAM: CHEST  2 VIEW  COMPARISON:  12/17/2011.  FINDINGS: Low lung volumes. Chronic cardiomegaly. Stable cardiac size and mediastinal contours. No pneumothorax or pulmonary edema. No pleural effusion or consolidation. Eventration of the diaphragm re - identified. No acute osseous abnormality identified. Stable cholecystectomy clips.  IMPRESSION: Low lung volumes, otherwise no acute cardiopulmonary abnormality.   Electronically Signed   By: Augusto GambleLee  Hall M.D.   On: 11/15/2014 16:35     EKG: Independently reviewed. Sinus Bradycardia,      Assessment/Plan:   80 y.o. female with  Principal Problem:   1.   SIRS (systemic inflammatory response syndrome)/ Sepsis- ? Aspiration Pneumonia, SLP eval  In AM for Dysphagia, Made NPO   Cultures sent   IV Vanc and Zosyn Empirically   IVFs   Monitor Leukocytosis and Temps  Active Problems:   2.   Leukocytosis   Monitor Trend initial WBC  25.4    3.   Atrial fibrillation   On Metoprolol ER     4.   CHF (congestive heart failure)   On Lasix     5.   Hypertension   On Amlodipine, Lasix, Hydralazine, and Metoprolol     6.   CAD (coronary artery disease)   On Metoprolol     7.   CKD (chronic kidney disease), stage IV   Gentle IVFs     8.   Dementia   On Aricept and Namenda       Code Status:      DO NOT RESUSCITATE (DNR) Family Communication:   No Family Present  Disposition Plan:    Inpatient   Time spent:  1060 Minutes  Ron ParkerJENKINS,Marayah Higdon C [facility] Pager [phone]   If 7AM -7PM Please Contact the Day Rounding Team MD for [facility]  If 7PM-7AM, Please Contact Night-Floor Coverage  [email or web address] Password  [email or web address] 11/15/2014, 7:40 PM

## 2014-11-15 NOTE — Progress Notes (Signed)
CSW attempted to meet with patient at bedside. However, the patient was asleep. CSW consulted with the nurse who [facility] Nurse stated that the patients lab results are not back yet. Therefore, it is uncertain if the patient will need to be admitted.   Trish MageBrittney Dontavia Brand, LCSWA [phone] ED CSW 11/15/2014 6:58 PM

## 2014-11-15 NOTE — ED Provider Notes (Signed)
CSN: [id number]     Arrival date & time 11/15/14  1544 History   First MD Initiated Contact with Patient 11/15/14 1548     Chief Complaint  Patient presents with  . Shaking     (Consider location/radiation/quality/duration/timing/severity/associated sxs/prior Treatment) HPI Comments: 80 yo female with a history of dementia.  who presents from her nursing facility secondary to shaking and shivering.  Pt is unable to tell me what's wrong other than that she feels bad.  Per report, her symptoms started today.  Level V Caveat secondary to dementia.    Past Medical History  Diagnosis Date  . Atrial fibrillation     Rapid response, hospital, September, 2012  . CHF (congestive heart failure)     Combined systolic and diastolic, EF 40-45%, 2012  . Ejection fraction     EF 40-45%, echo, September, 2012  . History of noncompliance with medical treatment   . Hypertension   . Dyslipidemia   . Dementia      ? mild ?  . CAD (coronary artery disease)     PCI to LAD early 1990s  . Degenerative joint disease   . Chest pain     Chest wall pain and costochondritis  . CKD (chronic kidney disease), stage IV   . Edema     Left greater than right lower extremity, No DVT by Doppler, September, 2012  . Aortic insufficiency     Mild, echo, September, 2012  . Mitral regurgitation     Moderate, echo, 2012  . Pulmonary hypertension     46 mmHg, echo, 2012  . Hyperlipidemia   . Bradycardia     October, 2012  . Fatigue     October, 2012   Past Surgical History  Procedure Laterality Date  . Cardiac catheterization    . Cataract extraction     Family History  Problem Relation Age of Onset  . Pneumonia Mother     died  . Other Father     died on a fire  . Coronary artery disease Brother     2 brothers   History  Substance Use Topics  . Smoking status: Former Smoker -- 0 years  . Smokeless tobacco: Not on file  . Alcohol Use: No   OB History    No data available     Review of  Systems  Unable to perform ROS: Dementia      Allergies  Atenolol; Ramipril; and Simvastatin  Home Medications   Prior to Admission medications   Medication Sig Start Date End Date Taking? Authorizing Provider  acetaminophen (TYLENOL) 500 MG tablet Take 1,000 mg by mouth every 4 (four) hours as needed for moderate pain, fever or headache.   Yes Historical Provider, MD  alum & mag hydroxide-simeth (GERI-LANTA) 200-200-20 MG/5ML suspension Take 30 mLs by mouth every 6 (six) hours as needed for indigestion or heartburn.   Yes Historical Provider, MD  amLODipine (NORVASC) 10 MG tablet Take 10 mg by mouth every morning.   Yes Historical Provider, MD  cloNIDine (CATAPRES - DOSED IN MG/24 HR) 0.3 mg/24hr patch Place 0.3 mg onto the skin every Wednesday.   Yes Historical Provider, MD  donepezil (ARICEPT) 10 MG tablet Take 10 mg by mouth at bedtime.   Yes Historical Provider, MD  ferrous sulfate 325 (65 FE) MG tablet Take 325 mg by mouth daily with breakfast.     Yes Historical Provider, MD  furosemide (LASIX) 20 MG tablet Take 20 mg by mouth every  morning.   Yes Historical Provider, MD  gabapentin (NEURONTIN) 300 MG capsule Take 300 mg by mouth at bedtime.   Yes Historical Provider, MD  guaifenesin (ROBITUSSIN) 100 MG/5ML syrup Take 200 mg by mouth 4 (four) times daily as needed for cough.   Yes Historical Provider, MD  hydrALAZINE (APRESOLINE) 25 MG tablet Take 25 mg by mouth 3 (three) times daily.   Yes Historical Provider, MD  hydrochlorothiazide (MICROZIDE) 12.5 MG capsule Take 12.5 mg by mouth every morning.   Yes Historical Provider, MD  HYDROcodone-acetaminophen (NORCO) 7.5-325 MG per tablet Take 1 tablet by mouth 4 (four) times daily.   Yes Historical Provider, MD  latanoprost (XALATAN) 0.005 % ophthalmic solution Place 1 drop into both eyes at bedtime.   Yes Historical Provider, MD  loperamide (IMODIUM) 2 MG capsule Take 2 mg by mouth as needed for diarrhea or loose stools.   Yes  Historical Provider, MD  magnesium hydroxide (MILK OF MAGNESIA) 400 MG/5ML suspension Take 30 mLs by mouth at bedtime as needed for mild constipation.   Yes Historical Provider, MD  meloxicam (MOBIC) 7.5 MG tablet Take 7.5 mg by mouth every morning.   Yes Historical Provider, MD  Memantine HCl ER (NAMENDA XR) 14 MG CP24 Take 14 mg by mouth every morning.   Yes Historical Provider, MD  potassium chloride (K-DUR) 10 MEQ tablet Take 10 mEq by mouth every morning.   Yes Historical Provider, MD  Vitamin D, Ergocalciferol, (DRISDOL) 50000 UNITS CAPS capsule Take 50,000 Units by mouth every Wednesday.   Yes Historical Provider, MD  hydrALAZINE (APRESOLINE) 25 MG tablet Take 1 tablet (25 mg total) by mouth every 8 (eight) hours. 12/23/11 12/22/12  Belkys A Regalado, MD  metoprolol succinate (TOPROL-XL) 12.5 mg TB24 Take 0.5 tablets (12.5 mg total) by mouth daily. Patient not taking: Reported on 11/15/2014 12/23/11   Belkys A Regalado, MD  traMADol (ULTRAM) 50 MG tablet Take 1 tablet (50 mg total) by mouth every 6 (six) hours as needed. Patient not taking: Reported on 11/15/2014 12/23/11   Belkys A Regalado, MD   BP 137/64 mmHg  Pulse 61  Temp(Src) 99.8 F (37.7 C) (Oral)  Resp 16  SpO2 96% Physical Exam  Constitutional: She appears well-developed and well-nourished. No distress.  HENT:  Head: Normocephalic and atraumatic.  Mouth/Throat: Oropharynx is clear and moist.  Eyes: Conjunctivae are normal. Pupils are equal, round, and reactive to light. No scleral icterus.  Neck: Neck supple.  Cardiovascular: Normal rate, regular rhythm, normal heart sounds and intact distal pulses.   No murmur heard. Pulmonary/Chest: Effort normal and breath sounds normal. No stridor. No respiratory distress. She has no rales.  Abdominal: Soft. Bowel sounds are normal. She exhibits no distension. There is no tenderness. There is no rebound and no guarding.  Musculoskeletal: Normal range of motion. She exhibits no edema.   Neurological: She is alert. She is disoriented (oriented to person, that she's in a hospital, not time. ).  Skin: Skin is warm and dry. No rash noted.  Psychiatric: She has a normal mood and affect. Her behavior is normal.  Nursing note and vitals reviewed.   ED Course  Procedures (including critical care time) Labs Review Labs Reviewed  CBC WITH DIFFERENTIAL - Abnormal; Notable for the following:    WBC 25.4 (*)    RBC 3.40 (*)    Hemoglobin 10.4 (*)    HCT 33.9 (*)    Neutrophils Relative % 88 (*)    Neutro Abs 22.5 (*)  Lymphocytes Relative 7 (*)    Monocytes Absolute 1.2 (*)    All other components within normal limits  COMPREHENSIVE METABOLIC PANEL - Abnormal; Notable for the following:    Glucose, Bld 111 (*)    BUN 38 (*)    Creatinine, Ser 1.93 (*)    Albumin 3.1 (*)    Total Bilirubin 0.2 (*)    GFR calc non Af Amer 23 (*)    GFR calc Af Amer 27 (*)    All other components within normal limits  URINALYSIS, ROUTINE W REFLEX MICROSCOPIC - Abnormal; Notable for the following:    Protein, ur 100 (*)    All other components within normal limits  URINE MICROSCOPIC-ADD ON - Abnormal; Notable for the following:    Casts HYALINE CASTS (*)    All other components within normal limits  CULTURE, BLOOD (ROUTINE X 2)  CULTURE, BLOOD (ROUTINE X 2)  LACTIC ACID, PLASMA    Imaging Review Dg Chest 2 View  11/15/2014   CLINICAL DATA:  80 year old female with abnormal shaking, chills. Initial encounter.  EXAM: CHEST  2 VIEW  COMPARISON:  12/17/2011.  FINDINGS: Low lung volumes. Chronic cardiomegaly. Stable cardiac size and mediastinal contours. No pneumothorax or pulmonary edema. No pleural effusion or consolidation. Eventration of the diaphragm re - identified. No acute osseous abnormality identified. Stable cholecystectomy clips.  IMPRESSION: Low lung volumes, otherwise no acute cardiopulmonary abnormality.   Electronically Signed   By: Augusto Gamble M.D.   On: 11/15/2014 16:35   All radiology studies independently viewed by me.      EKG Interpretation   Date/Time:  Wednesday November 15 2014 16:36:35 EST Ventricular Rate:  57 PR Interval:  31 QRS Duration: 138 QT Interval:  536 QTC Calculation: 522 R Axis:   86 Text Interpretation:  Sinus rhythm Short PR interval Right bundle branch  block Inferior infarct, age indeterminate Consider anterolateral infarct  Artifact in lead(s) I II No significant change since last tracing  Confirmed by POLLINA  MD, CHRISTOPHER [phone]) on 11/15/2014 4:38:57 PM      MDM   Final diagnoses:  Shaking  SIRS (systemic inflammatory response syndrome)    80 year old female presenting from her nursing facility secondary to shivering. On arrival, she was found to be febrile. White count is 25,000. Covered empirically with Vanco and Zosyn. No clear source.  Admitted by hospitalist.    Warnell Forester, MD 11/15/14 2003

## 2014-11-15 NOTE — ED Notes (Signed)
MD at bedside. Hospitalist at bedside. 

## 2014-11-16 LAB — CBC
HCT: 28.6 % — ABNORMAL LOW (ref 36.0–46.0)
Hemoglobin: 9 g/dL — ABNORMAL LOW (ref 12.0–15.0)
MCH: 30.9 pg (ref 26.0–34.0)
MCHC: 31.5 g/dL (ref 30.0–36.0)
MCV: 98.3 fL (ref 78.0–100.0)
PLATELETS: 240 10*3/uL (ref 150–400)
RBC: 2.91 MIL/uL — ABNORMAL LOW (ref 3.87–5.11)
RDW: 13.4 % (ref 11.5–15.5)
WBC: 21.5 10*3/uL — AB (ref 4.0–10.5)

## 2014-11-16 LAB — BASIC METABOLIC PANEL
Anion gap: 18 — ABNORMAL HIGH (ref 5–15)
BUN: 32 mg/dL — ABNORMAL HIGH (ref 6–23)
CALCIUM: 8.9 mg/dL (ref 8.4–10.5)
CO2: 23 mEq/L (ref 19–32)
CREATININE: 1.79 mg/dL — AB (ref 0.50–1.10)
Chloride: 103 mEq/L (ref 96–112)
GFR calc Af Amer: 29 mL/min — ABNORMAL LOW (ref >90)
GFR, EST NON AFRICAN AMERICAN: 25 mL/min — AB (ref >90)
Glucose, Bld: 84 mg/dL (ref 70–99)
Potassium: 3.2 mEq/L — ABNORMAL LOW (ref 3.7–5.3)
SODIUM: 144 meq/L (ref 137–147)

## 2014-11-16 LAB — MRSA PCR SCREENING: MRSA by PCR: POSITIVE — AB

## 2014-11-16 MED ORDER — PNEUMOCOCCAL VAC POLYVALENT 25 MCG/0.5ML IJ INJ
0.5000 mL | INJECTION | INTRAMUSCULAR | Status: AC
  Administered 2014-11-17: 0.5 mL via INTRAMUSCULAR
  Filled 2014-11-16 (×2): qty 0.5

## 2014-11-16 MED ORDER — SODIUM CHLORIDE 0.9 % IV BOLUS (SEPSIS)
250.0000 mL | Freq: Once | INTRAVENOUS | Status: AC
  Administered 2014-11-16: 250 mL via INTRAVENOUS

## 2014-11-16 MED ORDER — ACETAMINOPHEN 325 MG RE SUPP
325.0000 mg | Freq: Once | RECTAL | Status: AC
  Administered 2014-11-16: 325 mg via RECTAL
  Filled 2014-11-16: qty 1

## 2014-11-16 MED ORDER — CHLORHEXIDINE GLUCONATE CLOTH 2 % EX PADS
6.0000 | MEDICATED_PAD | Freq: Every day | CUTANEOUS | Status: AC
  Administered 2014-11-16 – 2014-11-20 (×5): 6 via TOPICAL

## 2014-11-16 MED ORDER — SODIUM CHLORIDE 0.9 % IV SOLN
INTRAVENOUS | Status: DC
  Administered 2014-11-16 (×2): via INTRAVENOUS

## 2014-11-16 MED ORDER — SODIUM CHLORIDE 0.9 % IV BOLUS (SEPSIS)
500.0000 mL | Freq: Once | INTRAVENOUS | Status: AC
  Administered 2014-11-16: 500 mL via INTRAVENOUS

## 2014-11-16 MED ORDER — MUPIROCIN 2 % EX OINT
1.0000 "application " | TOPICAL_OINTMENT | Freq: Two times a day (BID) | CUTANEOUS | Status: AC
  Administered 2014-11-16 – 2014-11-20 (×10): 1 via NASAL
  Filled 2014-11-16 (×2): qty 22

## 2014-11-16 MED ORDER — BUDESONIDE 0.25 MG/2ML IN SUSP
0.2500 mg | Freq: Two times a day (BID) | RESPIRATORY_TRACT | Status: DC
  Administered 2014-11-16 – 2014-11-28 (×21): 0.25 mg via RESPIRATORY_TRACT
  Filled 2014-11-16 (×24): qty 2

## 2014-11-16 MED ORDER — ACETAMINOPHEN 325 MG PO TABS
650.0000 mg | ORAL_TABLET | ORAL | Status: DC | PRN
  Administered 2014-11-17 – 2014-11-19 (×4): 650 mg via ORAL
  Filled 2014-11-16 (×4): qty 2

## 2014-11-16 MED ORDER — CHLORHEXIDINE GLUCONATE 0.12 % MT SOLN
15.0000 mL | Freq: Two times a day (BID) | OROMUCOSAL | Status: DC
  Administered 2014-11-16 – 2014-11-26 (×15): 15 mL via OROMUCOSAL
  Filled 2014-11-16 (×25): qty 15

## 2014-11-16 MED ORDER — SACCHAROMYCES BOULARDII 250 MG PO CAPS
250.0000 mg | ORAL_CAPSULE | Freq: Two times a day (BID) | ORAL | Status: DC
  Administered 2014-11-16 – 2014-11-19 (×6): 250 mg via ORAL
  Filled 2014-11-16 (×9): qty 1

## 2014-11-16 MED ORDER — CETYLPYRIDINIUM CHLORIDE 0.05 % MT LIQD
7.0000 mL | Freq: Two times a day (BID) | OROMUCOSAL | Status: DC
  Administered 2014-11-16 – 2014-11-27 (×12): 7 mL via OROMUCOSAL

## 2014-11-16 MED ORDER — ACETAMINOPHEN 650 MG RE SUPP
650.0000 mg | RECTAL | Status: DC | PRN
Start: 2014-11-16 — End: 2014-11-20
  Administered 2014-11-16: 650 mg via RECTAL
  Filled 2014-11-16: qty 1

## 2014-11-16 MED ORDER — POTASSIUM CHLORIDE 10 MEQ/100ML IV SOLN
10.0000 meq | INTRAVENOUS | Status: AC
  Administered 2014-11-16 – 2014-11-17 (×4): 10 meq via INTRAVENOUS
  Filled 2014-11-16 (×7): qty 100

## 2014-11-16 MED ORDER — METOPROLOL SUCCINATE ER 25 MG PO TB24
25.0000 mg | ORAL_TABLET | Freq: Every day | ORAL | Status: DC
  Administered 2014-11-16 – 2014-11-19 (×4): 25 mg via ORAL
  Filled 2014-11-16 (×4): qty 1

## 2014-11-16 MED ORDER — SODIUM CHLORIDE 0.9 % IV BOLUS (SEPSIS): 250.0000 mL | Freq: Once | INTRAVENOUS | Status: DC

## 2014-11-16 NOTE — Progress Notes (Signed)
[facility] PROGRESS NOTE  Kristi DubinKatherine K [name] ZOX:[id number] DOB: [birth year] DOA: 11/15/2014 PCP: Alva GarnetSHELTON,KIMBERLY R., MD  Assessment/Plan: SIRS (systemic inflammatory response syndrome)/ Sepsis- ? Aspiration Pneumonia                         Cultures sent; will follow results  Continue IV Vanc and Zosyn  IVFs and supportive care                         Per SPL rec's will start dysphagia 3 with thin liquids Monitor Leukocytosis and Temps                         Will start pulmicort   2. Leukocytosis improving with IVF's and antibiotics.                          Will follow trend   3. Atrial fibrillation continue Metoprolol ER; HR up and down; patient asymptomatic and DNR. Will disocntinue telemetry.                          Fever contributing to tachycardia    4. CHF (congestive heart failure) On Lasix; which is hold initially due to mild dehydration and renal failure    5. Hypertension On Hydralazine, and Metoprolol; will hold lasix for now.    6. CAD (coronary artery disease) On Metoprolol; no CP. Will continue B-blocker    7. CKD (chronic kidney disease), stage IV will continue IVF's and follow renal function.    8. Dementia  Continue supportive care and the use of aricept and Namenda    Code Status: DNR Family Communication: no family at bedside Disposition Plan: back to SNF when medically stable   Consultants:  None   Procedures:  See below for x-ray reports   Antibiotics:  vanc 12/16  Zosyn 12/16  HPI/Subjective: Positive low grade fever, no CP or SOB. Patient able to follow commands.  Objective: Filed Vitals:   11/16/14 1410  BP: 131/67  Pulse: 65  Temp: 98.4 F  (36.9 C)  Resp: 20    Intake/Output Summary (Last 24 hours) at 11/16/14 1838 Last data filed at 11/16/14 1404  Gross per 24 hour  Intake   1325 ml  Output      0 ml  Net   1325 ml   Filed Weights   11/15/14 1910 11/15/14 2018  Weight: 52.617 kg (116 lb) 50.576 kg (111 lb 8 oz)    Exam:   General:  AAOX2, no CP and no SOB. Was able to follow simple commands; patient with positive low grade fever  Cardiovascular: irregular, no rub so rgallops  Respiratory: scattered rhonchi, no crackles; slight positive exp wheezing; positive rhonchi  Abdomen: soft, NT, ND, positive BS  Musculoskeletal: no edema, no cyanosis   Data Reviewed: Basic Metabolic Panel:  Recent Labs Lab 11/15/14 1729 11/16/14 0343  NA 141 144  K 4.5 3.2*  CL 101 103  CO2 25 23  GLUCOSE 111* 84  BUN 38* 32*  CREATININE 1.93* 1.79*  CALCIUM 9.8 8.9   Liver Function Tests:  Recent Labs Lab 11/15/14 1729  AST 14  ALT 6  ALKPHOS 90  BILITOT 0.2*  PROT 7.5  ALBUMIN 3.1*   CBC:  Recent Labs Lab 11/15/14 1729 11/16/14 0343  WBC 25.4* 21.5*  NEUTROABS 22.5*  --  HGB 10.4* 9.0*  HCT 33.9* 28.6*  MCV 99.7 98.3  PLT 256 240    Recent Results (from the past 240 hour(s))  MRSA PCR Screening     Status: Abnormal   Collection Time: 11/15/14 10:34 PM  Result Value Ref Range Status   MRSA by PCR POSITIVE (A) NEGATIVE Final    Comment:        The GeneXpert MRSA Assay (FDA approved for NASAL specimens only), is one component of a comprehensive MRSA colonization surveillance program. It is not intended to diagnose MRSA infection nor to guide or monitor treatment for MRSA infections. RESULT CALLED TO, READ BACK BY AND VERIFIED WITH: L.HUDSON,RN AT 0219 ON 11/16/14 BY SHEA.W      Studies: Dg Chest 2 View  11/15/2014   CLINICAL DATA:  80 year old female with abnormal shaking, chills. Initial encounter.  EXAM: CHEST  2 VIEW  COMPARISON:  12/17/2011.  FINDINGS: Low lung volumes. Chronic  cardiomegaly. Stable cardiac size and mediastinal contours. No pneumothorax or pulmonary edema. No pleural effusion or consolidation. Eventration of the diaphragm re - identified. No acute osseous abnormality identified. Stable cholecystectomy clips.  IMPRESSION: Low lung volumes, otherwise no acute cardiopulmonary abnormality.   Electronically Signed   By: Augusto GambleLee  Hall M.D.   On: 11/15/2014 16:35    Scheduled Meds: . antiseptic oral rinse  7 mL Mouth Rinse q12n4p  . chlorhexidine  15 mL Mouth Rinse BID  . Chlorhexidine Gluconate Cloth  6 each Topical Q0600  . enoxaparin (LOVENOX) injection  30 mg Subcutaneous Q24H  . latanoprost  1 drop Both Eyes QHS  . mupirocin ointment  1 application Nasal BID  . piperacillin-tazobactam (ZOSYN)  IV  3.375 g Intravenous Q8H  . [START ON 11/17/2014] pneumococcal 23 valent vaccine  0.5 mL Intramuscular Tomorrow-1000  . saccharomyces boulardii  250 mg Oral BID  . vancomycin  500 mg Intravenous Q24H   Continuous Infusions: . sodium chloride 75 mL/hr at 11/16/14 1404    Principal Problem:   SIRS (systemic inflammatory response syndrome) Active Problems:   Atrial fibrillation   CHF (congestive heart failure)   Hypertension   Dementia   CAD (coronary artery disease)   CKD (chronic kidney disease), stage IV   Leukocytosis   Sepsis    Time spent: 30 minutes    Vassie LollMadera, Christia Domke  [facility] Pager [phone]. If 7PM-7AM, please contact night-coverage at [email or web address], password [email or web address] 11/16/2014, 6:38 PM  LOS: 1 day

## 2014-11-16 NOTE — Progress Notes (Signed)
Patient had a temperature of 102.7. Unable to give tylenol again. NP was notified and new orders were given for a 250cc bolus NS and one time dose of Tylenol supp. Will recheck temp.

## 2014-11-16 NOTE — Progress Notes (Signed)
Patient had a 15 beat run of SVT. Patient was asleep. BP WNL. NP made aware. Will continue to monitor.

## 2014-11-16 NOTE — Evaluation (Signed)
Clinical/Bedside Swallow Evaluation Patient Details  Name: Kristi [name] MRN: [id number] Date of Birth: 1931/07/20  Today's Date: 11/16/2014 Time: [id number] SLP Time Calculation (min) (ACUTE ONLY): 19 min  Past Medical History:  Past Medical History  Diagnosis Date  . Atrial fibrillation     Rapid response, hospital, September, 2012  . CHF (congestive heart failure)     Combined systolic and diastolic, EF 40-45%, 2012  . Ejection fraction     EF 40-45%, echo, September, 2012  . History of noncompliance with medical treatment   . Hypertension   . Dyslipidemia   . Dementia      ? mild ?  . CAD (coronary artery disease)     PCI to LAD early 1990s  . Degenerative joint disease   . Chest pain     Chest wall pain and costochondritis  . CKD (chronic kidney disease), stage IV   . Edema     Left greater than right lower extremity, No DVT by Doppler, September, 2012  . Aortic insufficiency     Mild, echo, September, 2012  . Mitral regurgitation     Moderate, echo, 2012  . Pulmonary hypertension     46 mmHg, echo, 2012  . Hyperlipidemia   . Bradycardia     October, 2012  . Fatigue     October, 2012   Past Surgical History:  Past Surgical History  Procedure Laterality Date  . Cardiac catheterization    . Cataract extraction     HPI:  Kristi [name] is a 80 y.o. female with a history of CAD, A.fib, CHF, HTN, and Dementia residing at the [facility] who was sent to the ED after being observed by staff to complain of chills and shivering during the afternoon after eating her lunch. She was found to have a rectal temperature of 101.2 in the ED, and a sepsis workup was initiated. CXR without evidence of acute disease.   Assessment / Plan / Recommendation Clinical Impression  Pt does not show overt signs of aspiration across trials, however a cognitively-based oral dysphagia is noted primarily with regular textures. Pt is easily distracted, resulting in prolonged  mastication and bolus formation, requiring Min cues from SLP for sustained attention and use of liquid washes. Recommend initiating Dys 3 diet and thin liquids with brief SLP f/u for tolerance.     Aspiration Risk  Mild    Diet Recommendation Dysphagia 3 (Mechanical Soft);Thin liquid   Liquid Administration via: Cup;Straw Medication Administration: Whole meds with liquid Supervision: Patient able to self feed;Intermittent supervision to cue for compensatory strategies Compensations: Slow rate;Small sips/bites Postural Changes and/or Swallow Maneuvers: Seated upright 90 degrees    Other  Recommendations Oral Care Recommendations: Oral care BID   Follow Up Recommendations  None    Frequency and Duration min 2x/week  1 week   Pertinent Vitals/Pain n/a    SLP Swallow Goals     Swallow Study Prior Functional Status       General HPI: Kristi [name] is a 80 y.o. female with a history of CAD, A.fib, CHF, HTN, and Dementia residing at [facility] who was sent to the ED after being observed by staff to complain of chills and shivering during the afternoon after eating her lunch. She was found to have a rectal temperature of 101.2 in the ED, and a sepsis workup was initiated. CXR without evidence of acute disease. Type of Study: Bedside swallow evaluation Previous Swallow Assessment: none in  chart Diet Prior to this Study: NPO Temperature Spikes Noted: Yes Respiratory Status: Room air History of Recent Intubation: No Behavior/Cognition: Alert;Cooperative;Pleasant mood;Distractible;Requires cueing Oral Cavity - Dentition: Adequate natural dentition (missing some lower dentition in front) Self-Feeding Abilities: Able to feed self Patient Positioning: Upright in bed Baseline Vocal Quality: Clear    Oral/Motor/Sensory Function Overall Oral Motor/Sensory Function: Appears within functional limits for tasks assessed   Ice Chips Ice chips: Not tested   Thin Liquid Thin  Liquid: Within functional limits Presentation: Cup;Self Fed;Straw    Nectar Thick Nectar Thick Liquid: Not tested   Honey Thick Honey Thick Liquid: Not tested   Puree Puree: Within functional limits Presentation: Self Fed;Spoon   Solid   GO    Solid: Impaired Presentation: Self Fed Oral Phase Impairments: Impaired mastication        Kristi HamLaura Paiewonsky, M.A. CCC-SLP [phone]  Kristi [name], Kristi [name] 11/16/2014,5:01 PM

## 2014-11-16 NOTE — Progress Notes (Signed)
Pt converted to AFIB at 1339 HR in 150's up into 170's. Paged Dr. Gwenlyn PerkingMadera. Awaiting on orders.

## 2014-11-16 NOTE — Progress Notes (Signed)
CRITICAL VALUE ALERT  Critical value received:  MRSA positive  Date of notification:  11/16/14  Time of notification:  0225  Critical value read back:Yes.    Nurse who received alert:  Linward NatalLindsay Shaarav Ripple, RN  MD notified (1st page):  N/A  Time of first page:  N/A  MD notified (2nd page):  Time of second page:  Responding MD:  N/A  Time MD responded: N/A  Positive MRSA standing protocol ordered.

## 2014-11-16 NOTE — Progress Notes (Signed)
Pt removed IV. Unable to start new IV. Paged IV team. Will move IV meds to later time until IV placed.

## 2014-11-16 NOTE — Progress Notes (Signed)
CARE MANAGEMENT NOTE 11/16/2014  Patient:  Kristi [name],Kristi [name]   Account Number:  [id number]  Date Initiated:  11/16/2014  Documentation initiated by:  Jiles CrockerHANDLER,Daymian Lill  Subjective/Objective Assessment:   ADMITTED WITH SIRS     Action/Plan:   PATIENT RESIDES IN A NURSING FACILITY; SOC WORKER REFERRAL PLACED   Anticipated DC Date:  11/21/2014   Anticipated DC Plan:  SKILLED NURSING FACILITY  In-house referral  Clinical Social Worker      DC Planning Services  CM consult         Status of service:  In process, will continue to follow Medicare Important Message given?   (If response is "NO", the following Medicare IM given date fields will be blank)  Per UR Regulation:  Reviewed for med. necessity/level of care/duration of stay  Comments:  12/17/2015Abelino Derrick- B Sayf Kerner RN,BSN,MHA [phone]

## 2014-11-16 NOTE — Progress Notes (Addendum)
At 0620, patient's temp was 101.9 after tylenol was given at 0345. NP was notified and new orders were given for a one time 500cc bolus of NS. At 0736, patient's temp was rechecked and it was 100.7.

## 2014-11-16 NOTE — Progress Notes (Signed)
Clinical Social Work Department BRIEF PSYCHOSOCIAL ASSESSMENT 11/16/2014  Patient:  Kristi [name],Kristi [name]     Account Number:  [id number]     Admit date:  11/15/2014  Clinical Social Worker:  Emrys Mceachron Deon PillingStewart, LCSWA  Date/Time:  11/16/2014 03:10 PM  Referred by:  CSW  Date Referred:  11/16/2014 Referred for  ALF Placement   Other Referral:   Interview type:  Family Other interview type:   pt daughter    PSYCHOSOCIAL DATA Living Status:  FACILITY Admitted from facility:   Level of care:   Primary support name:  Kristi [name] Primary support relationship to patient:  CHILD, ADULT Degree of support available:   strong    CURRENT CONCERNS Current Concerns  Post-Acute Placement   Other Concerns:    SOCIAL WORK ASSESSMENT / PLAN CSW unable to complete psychosocial assessment with patient, as patient only alert to self. CSW spoke wiht pt daughter by phone to complete assessment. Pt daughter shares that patient is a resident [facility] and in memory care. [facility]. Patient daughter hopes for [facility] to [facility] when medically [facility]. CSW and pt daughter discussed possibility of snf placement if recommended by physical therapy and if pt needs higher level of care. Pt duaghter states that the family is open to snf placement if needed and reocmmended. Pt daughter hopes for patient   to return to [facility] when medically stable.    CSW to complete fl2 for MD signature.   Assessment/plan status:  Psychosocial Support/Ongoing Assessment of Needs Other assessment/ plan:   Information/referral to community resources:   skilled nursing facility list to be provided if needed    PATIENT'S/FAMILY'S RESPONSE TO PLAN OF CARE: Patient daughter thanked csw for concern and support. Patient daughter to look into snf placement if needed, but hopes for patient to return to alf when stable.      Kristi CoasterKristen  Kristi [name], KentuckyLCSW [phone]  ED CSW

## 2014-11-17 DIAGNOSIS — D72829 Elevated white blood cell count, unspecified: Secondary | ICD-10-CM

## 2014-11-17 DIAGNOSIS — I482 Chronic atrial fibrillation: Secondary | ICD-10-CM

## 2014-11-17 DIAGNOSIS — N184 Chronic kidney disease, stage 4 (severe): Secondary | ICD-10-CM

## 2014-11-17 DIAGNOSIS — J690 Pneumonitis due to inhalation of food and vomit: Secondary | ICD-10-CM

## 2014-11-17 DIAGNOSIS — A419 Sepsis, unspecified organism: Secondary | ICD-10-CM | POA: Diagnosis not present

## 2014-11-17 LAB — BASIC METABOLIC PANEL
Anion gap: 14 (ref 5–15)
BUN: 28 mg/dL — AB (ref 6–23)
CO2: 22 mEq/L (ref 19–32)
CREATININE: 1.64 mg/dL — AB (ref 0.50–1.10)
Calcium: 8.9 mg/dL (ref 8.4–10.5)
Chloride: 108 mEq/L (ref 96–112)
GFR calc Af Amer: 33 mL/min — ABNORMAL LOW (ref >90)
GFR, EST NON AFRICAN AMERICAN: 28 mL/min — AB (ref >90)
Glucose, Bld: 108 mg/dL — ABNORMAL HIGH (ref 70–99)
POTASSIUM: 3.6 meq/L — AB (ref 3.7–5.3)
Sodium: 144 mEq/L (ref 137–147)

## 2014-11-17 LAB — MAGNESIUM: Magnesium: 2.3 mg/dL (ref 1.5–2.5)

## 2014-11-17 LAB — CLOSTRIDIUM DIFFICILE BY PCR: Toxigenic C. Difficile by PCR: NEGATIVE

## 2014-11-17 LAB — CBC
HCT: 27.4 % — ABNORMAL LOW (ref 36.0–46.0)
Hemoglobin: 8.8 g/dL — ABNORMAL LOW (ref 12.0–15.0)
MCH: 31.1 pg (ref 26.0–34.0)
MCHC: 32.1 g/dL (ref 30.0–36.0)
MCV: 96.8 fL (ref 78.0–100.0)
PLATELETS: 222 10*3/uL (ref 150–400)
RBC: 2.83 MIL/uL — ABNORMAL LOW (ref 3.87–5.11)
RDW: 13.4 % (ref 11.5–15.5)
WBC: 27 10*3/uL — ABNORMAL HIGH (ref 4.0–10.5)

## 2014-11-17 NOTE — Consult Note (Signed)
[facility] Referring Provider: Dr. Gwenlyn Perking Primary Care Physician:  No primary care provider on file. Primary Gastroenterologist:  Dr. Jarold Motto (retired)  Reason for Consultation: Swallowing difficulty   HPI:  Kristi [name] is a 80 y.o.  female with a history of CAD, A.fib, CHF, HTN, and Dementia residing at the [facility] who was sent to the ED after being observed by staff to complain of chills and shivering during the afternoon after eating her lunch. She was found to have a rectal temperature of 101.2 in the ED, and a sepsis workup was initiated. CXR without evidence of acute disease.  She was admitted, found to have very elevated WBC.  CXR was normal however.  She has had two speech evaluations, without overt aspiration noted.  Daughter mentioned to SLP that her mother has had swallowing issues in the past and has undergone esophageal dilations. I cannot find any record of this in epic but do see she was previously seen by Dr. Sheryn Bison  She is not able to help with history.  No family in room this afternoon, history gleaned from chart.  Past Medical History  Diagnosis Date  . Atrial fibrillation     Rapid response, hospital, September, 2012  . CHF (congestive heart failure)     Combined systolic and diastolic, EF 40-45%, 2012  . Ejection fraction     EF 40-45%, echo, September, 2012  . History of noncompliance with medical treatment   . Hypertension   . Dyslipidemia   . Dementia      ? mild ?  . CAD (coronary artery disease)     PCI to LAD early 1990s  . Degenerative joint disease   . Chest pain     Chest wall pain and costochondritis  . CKD (chronic kidney disease), stage IV   . Edema     Left greater than right lower extremity, No DVT by Doppler, September, 2012  . Aortic insufficiency     Mild, echo, September, 2012  . Mitral regurgitation     Moderate, echo, 2012  . Pulmonary hypertension     46 mmHg, echo, 2012  . Hyperlipidemia    . Bradycardia     October, 2012  . Fatigue     October, 2012    Past Surgical History  Procedure Laterality Date  . Cardiac catheterization    . Cataract extraction      Prior to Admission medications   Medication Sig Start Date End Date Taking? Authorizing Provider  acetaminophen (TYLENOL) 500 MG tablet Take 1,000 mg by mouth every 4 (four) hours as needed for moderate pain, fever or headache.   Yes Historical Provider, MD  alum & mag hydroxide-simeth (GERI-LANTA) 200-200-20 MG/5ML suspension Take 30 mLs by mouth every 6 (six) hours as needed for indigestion or heartburn.   Yes Historical Provider, MD  amLODipine (NORVASC) 10 MG tablet Take 10 mg by mouth every morning.   Yes Historical Provider, MD  cloNIDine (CATAPRES - DOSED IN MG/24 HR) 0.3 mg/24hr patch Place 0.3 mg onto the skin every Wednesday.   Yes Historical Provider, MD  donepezil (ARICEPT) 10 MG tablet Take 10 mg by mouth at bedtime.   Yes Historical Provider, MD  ferrous sulfate 325 (65 FE) MG tablet Take 325 mg by mouth daily with breakfast.     Yes Historical Provider, MD  furosemide (LASIX) 20 MG tablet Take 20 mg by mouth every morning.   Yes Historical Provider, MD  gabapentin (NEURONTIN) 300 MG  capsule Take 300 mg by mouth at bedtime.   Yes Historical Provider, MD  guaifenesin (ROBITUSSIN) 100 MG/5ML syrup Take 200 mg by mouth 4 (four) times daily as needed for cough.   Yes Historical Provider, MD  hydrALAZINE (APRESOLINE) 25 MG tablet Take 25 mg by mouth 3 (three) times daily.   Yes Historical Provider, MD  hydrochlorothiazide (MICROZIDE) 12.5 MG capsule Take 12.5 mg by mouth every morning.   Yes Historical Provider, MD  HYDROcodone-acetaminophen (NORCO) 7.5-325 MG per tablet Take 1 tablet by mouth 4 (four) times daily.   Yes Historical Provider, MD  latanoprost (XALATAN) 0.005 % ophthalmic solution Place 1 drop into both eyes at bedtime.   Yes Historical Provider, MD  loperamide (IMODIUM) 2 MG capsule Take 2 mg by  mouth as needed for diarrhea or loose stools.   Yes Historical Provider, MD  magnesium hydroxide (MILK OF MAGNESIA) 400 MG/5ML suspension Take 30 mLs by mouth at bedtime as needed for mild constipation.   Yes Historical Provider, MD  meloxicam (MOBIC) 7.5 MG tablet Take 7.5 mg by mouth every morning.   Yes Historical Provider, MD  Memantine HCl ER (NAMENDA XR) 14 MG CP24 Take 14 mg by mouth every morning.   Yes Historical Provider, MD  potassium chloride (K-DUR) 10 MEQ tablet Take 10 mEq by mouth every morning.   Yes Historical Provider, MD  Vitamin D, Ergocalciferol, (DRISDOL) 50000 UNITS CAPS capsule Take 50,000 Units by mouth every Wednesday.   Yes Historical Provider, MD  hydrALAZINE (APRESOLINE) 25 MG tablet Take 1 tablet (25 mg total) by mouth every 8 (eight) hours. 12/23/11 12/22/12  Belkys A Regalado, MD  metoprolol succinate (TOPROL-XL) 12.5 mg TB24 Take 0.5 tablets (12.5 mg total) by mouth daily. Patient not taking: Reported on 11/15/2014 12/23/11   Belkys A Regalado, MD  traMADol (ULTRAM) 50 MG tablet Take 1 tablet (50 mg total) by mouth every 6 (six) hours as needed. Patient not taking: Reported on 11/15/2014 12/23/11   Alba Cory, MD    Current Facility-Administered Medications  Medication Dose Route Frequency Provider Last Rate Last Dose  . 0.9 %  sodium chloride infusion   Intravenous Continuous Vassie Loll, MD 75 mL/hr at 11/16/14 2013    . acetaminophen (TYLENOL) tablet 650 mg  650 mg Oral Q4H PRN Roma Kayser Schorr, NP   650 mg at 11/17/14 0230   Or  . acetaminophen (TYLENOL) suppository 650 mg  650 mg Rectal Q4H PRN Roma Kayser Schorr, NP   650 mg at 11/16/14 0345  . antiseptic oral rinse (CPC / CETYLPYRIDINIUM CHLORIDE 0.05%) solution 7 mL  7 mL Mouth Rinse q12n4p Ron Parker, MD   7 mL at 11/17/14 1600  . budesonide (PULMICORT) nebulizer solution 0.25 mg  0.25 mg Nebulization BID Vassie Loll, MD   0.25 mg at 11/17/14 0902  . chlorhexidine (PERIDEX) 0.12 %  solution 15 mL  15 mL Mouth Rinse BID Ron Parker, MD   15 mL at 11/17/14 1634  . Chlorhexidine Gluconate Cloth 2 % PADS 6 each  6 each Topical Q0600 Ron Parker, MD   6 each at 11/17/14 0600  . enoxaparin (LOVENOX) injection 30 mg  30 mg Subcutaneous Q24H Ron Parker, MD   30 mg at 11/16/14 2205  . hydrALAZINE (APRESOLINE) injection 5 mg  5 mg Intravenous Q6H PRN Ron Parker, MD   5 mg at 11/15/14 2253  . HYDROmorphone (DILAUDID) injection 0.5-1 mg  0.5-1 mg Intravenous Q3H PRN Harvette  C Jenkins, MD      . latanoprost (XALATAN) 0.005 % ophthalmic solution 1 drop  1 drVelora Hecklerop Both Eyes QHS Ron ParkerHarvette C Jenkins, MD   1 drop at 11/16/14 2205  . metoprolol succinate (TOPROL-XL) 24 hr tablet 25 mg  25 mg Oral Daily Vassie Lollarlos Madera, MD   25 mg at 11/17/14 1011  . mupirocin ointment (BACTROBAN) 2 % 1 application  1 application Nasal BID Ron ParkerHarvette C Jenkins, MD   1 application at 11/17/14 0957  . ondansetron (ZOFRAN) tablet 4 mg  4 mg Oral Q6H PRN Ron ParkerHarvette C Jenkins, MD       Or  . ondansetron (ZOFRAN) injection 4 mg  4 mg Intravenous Q6H PRN Ron ParkerHarvette C Jenkins, MD   4 mg at 11/17/14 0957  . piperacillin-tazobactam (ZOSYN) IVPB 3.375 g  3.375 g Intravenous Q8H Rollene Farerin R Williamson, RPH   3.375 g at 11/17/14 1312  . saccharomyces boulardii (FLORASTOR) capsule 250 mg  250 mg Oral BID Vassie Lollarlos Madera, MD   250 mg at 11/17/14 1011    Allergies as of 11/15/2014 - Review Complete 11/15/2014  Allergen Reaction Noted  . Atenolol Other (See Comments)   . Ramipril Cough 07/06/2007  . Simvastatin Other (See Comments)     Family History  Problem Relation Age of Onset  . Pneumonia Mother     died  . Other Father     died on a fire  . Coronary artery disease Brother     2 brothers    History   Social History  . Marital Status: Widowed    Spouse Name: N/A    Number of Children: N/A  . Years of Education: N/A   Occupational History  . retired    Social History Main Topics  .  Smoking status: Former Smoker -- 0 years  . Smokeless tobacco: Never Used  . Alcohol Use: No  . Drug Use: No  . Sexual Activity: Not on file   Other Topics Concern  . Not on file   Social History Narrative     Review of Systems: Pertinent positive and negative review of systems were noted in the above HPI section. Complete review of systems was performed and was otherwise normal.   Physical Exam: Vital signs in last 24 hours: Temp:  [97.9 F (36.6 C)-101.2 F (38.4 C)] 97.9 F (36.6 C) (12/18 1320) Pulse Rate:  [50-127] 55 (12/18 1320) Resp:  [18-20] 18 (12/18 1320) BP: (110-146)/(48-82) 146/57 mmHg (12/18 1320) SpO2:  [97 %-100 %] 97 % (12/18 1320) Weight:  [111 lb 4.3 oz (50.472 kg)] 111 lb 4.3 oz (50.472 kg) (12/18 1309) Last BM Date: 11/16/14 Constitutional: pleasantly demented Psychiatric: alert and oriented x2 Eyes: extraocular movements intact Mouth: oral pharynx moist, no lesions Neck: supple no lymphadenopathy Cardiovascular: heart regular rate and rhythm Lungs: clear to auscultation bilaterally Abdomen: soft, nontender, nondistended, no obvious ascites, no peritoneal signs, normal bowel sounds Extremities: no lower extremity edema bilaterally Skin: no lesions on visible extremities   Lab Results:  Recent Labs  11/15/14 1729 11/16/14 0343 11/17/14 0417  WBC 25.4* 21.5* 27.0*  HGB 10.4* 9.0* 8.8*  HCT 33.9* 28.6* 27.4*  PLT 256 240 222  MCV 99.7 98.3 96.8   BMET  Recent Labs  11/15/14 1729 11/16/14 0343 11/17/14 0417  NA 141 144 144  K 4.5 3.2* 3.6*  CL 101 103 108  CO2 25 23 22   GLUCOSE 111* 84 108*  BUN 38* 32* 28*  CREATININE 1.93* 1.79* 1.64*  CALCIUM 9.8 8.9 8.9   LFT  Recent Labs  11/15/14 1729  BILITOT 0.2*  AST 14  ALT 6  ALKPHOS 90  PROT 7.5  ALBUMIN 3.1*     Impression/Plan: 80 y.o. female with intermittent swallowing difficulty  ?aspiration pneumonia however normal CXR makes that diagnosis suspect.  Would not  proceed directly to EGD given overall clinical picture. Will order barium esophagram to be done however and if strictures noted, then will  proceed with EGD.  Continue D3 diet as recommended by SLP for now.  Continue broad spect abx.   Rachael FeeJacobs, Nejla Reasor P, MD  11/17/2014, 5:39 PM [facility] Pager [phone]

## 2014-11-17 NOTE — Progress Notes (Signed)
Speech Language Pathology Treatment: Dysphagia  Patient Details Name: Kristi [name] MRN: [id number] DOB: [birth year] Today's Date: 11/17/2014 Time: 1200-1230 SLP Time Calculation (min) (ACUTE ONLY): 30 min  Assessment / Plan / Recommendation Clinical Impression  Patient appears to swallow liquids without any difficulty, however, daughter reports pt had an episode of "food sticking" and almost vomiting during breakfast meal.  Daughter reports pt has a h/o esophageal stricture, requiring dilitation "a long time ago."  ? If dilitation may be indicated again.  SLP will d/c for now, as this appears to be a primary esophageal dysphagia.  Please consider GI f/u.   HPI HPI: Kristi DubinKatherine K Hasler is a 80 y.o. female with a history of CAD, A.fib, CHF, HTN, and Dementia residing at the [facility] who was sent to the ED after being observed by staff to complain of chills and shivering during the afternoon after eating her lunch. She was found to have a rectal temperature of 101.2 in the ED, and a sepsis workup was initiated. CXR without evidence of acute disease.   Pertinent Vitals Pain Assessment: No/denies pain  SLP Plan  Consult other service (comment) (Consider GI consult (? stricture))    Recommendations Diet recommendations: Dysphagia 3 (mechanical soft);Thin liquid Liquids provided via: Cup;Straw Medication Administration: Whole meds with liquid Supervision: Patient able to self feed;Intermittent supervision to cue for compensatory strategies Compensations: Slow rate;Small sips/bites;Follow solids with liquid Postural Changes and/or Swallow Maneuvers: Seated upright 90 degrees              Oral Care Recommendations: Oral care BID Follow up Recommendations: None Plan: Consult other service (comment) (Consider GI consult (? stricture))    GO     Kristi [name], Sabina Beavers T 11/17/2014, 1:36 PM

## 2014-11-17 NOTE — Progress Notes (Signed)
[facility] PROGRESS NOTE  Kristi [name] QMV:[id number] DOB: Aug 13, 1931 DOA: 11/15/2014 PCP: No primary care provider on file.  Assessment/Plan: 1. SIRS (systemic inflammatory response syndrome)/ Sepsis- ? Aspiration Pneumonia                         Cultures sent; will follow results  Continue Zosyn  IVFs and supportive care                         Per SPL rec's will start dysphagia 3 with thin liquids; will request GI to see for rec's on possible dilation  Monitor Leukocytosis and Temps                         Will continue pulmicort and will repeat CXR in am   2. Leukocytosis improving with IVF's and antibiotics.                          Will follow trend   3. Atrial fibrillation continue Metoprolol ER; HR up and down; patient asymptomatic and DNR. Will disocntinue telemetry.                          Fever contributing to tachycardia    4. CHF (congestive heart failure): diastolic in nature. Preserved EF  (55-65%) On Lasix; which is hold initially due to mild dehydration and acute on chronic renal failure                         Compensated at this moment; continue daily weights    5. Hypertension On Hydralazine, and Metoprolol; will continue holding lasix for now.    6. CAD (coronary artery disease) On Metoprolol; no CP. Will continue B-blocker    7. CKD (chronic kidney disease), stage IV will continue IVF's and follow renal function.                         Cr 1.64   8. Dementia  Continue supportive care and the use of aricept and Namenda    9.   Dysphagia and hx of esophageal stricture: will ask GI to evaluate. Meanwhile will follow recommendations from SPL regarding type of diet.  10.  Physical deconditioning: will ask PT/OT to see     Code Status: DNR Family Communication: no family at bedside Disposition Plan: most likely SNF at discharge   Consultants:  None   Procedures:  See below for x-ray reports   Antibiotics:  vanc 12/16>>12/18  Zosyn 12/16  HPI/Subjective: Patient continue to have fever; no CP or palpitations.  Objective: Filed Vitals:   11/17/14 1320  BP: 146/57  Pulse: 55  Temp: 97.9 F (36.6 C)  Resp: 18    Intake/Output Summary (Last 24 hours) at 11/17/14 1652 Last data filed at 11/17/14 0647  Gross per 24 hour  Intake    170 ml  Output      0 ml  Net    170 ml   Filed Weights   11/15/14 1910 11/15/14 2018 11/17/14 1309  Weight: 52.617 kg (116 lb) 50.576 kg (111 lb 8 oz) 50.472 kg (111 lb 4.3 oz)    Exam:   General:  AAOX2, no CP and no SOB. Able to follow simple commands; patient spike fever overnight again  Cardiovascular: irregular, no rub so rgallops  Respiratory: scattered rhonchi (R > L), no crackles; slight positive exp wheezing; no crackles  Abdomen: soft, NT, ND, positive BS  Musculoskeletal: no edema, no cyanosis   Data Reviewed: Basic Metabolic Panel:  Recent Labs Lab 11/15/14 1729 11/16/14 0343 11/17/14 0417  NA 141 144 144  K 4.5 3.2* 3.6*  CL 101 103 108  CO2 25 23 22   GLUCOSE 111* 84 108*  BUN 38* 32* 28*  CREATININE 1.93* 1.79* 1.64*  CALCIUM 9.8 8.9 8.9  MG  --   --  2.3   Liver Function Tests:  Recent Labs Lab 11/15/14 1729  AST 14  ALT 6  ALKPHOS 90  BILITOT 0.2*  PROT 7.5  ALBUMIN 3.1*   CBC:  Recent Labs Lab 11/15/14 1729 11/16/14 0343 11/17/14 0417  WBC 25.4* 21.5* 27.0*  NEUTROABS 22.5*  --   --   HGB 10.4* 9.0* 8.8*  HCT 33.9* 28.6* 27.4*  MCV 99.7 98.3 96.8  PLT 256 240 222    Recent Results (from the past 240 hour(s))  Culture, blood (routine x 2)     Status: None (Preliminary result)   Collection Time: 11/15/14  6:59 PM  Result Value Ref Range  Status   Specimen Description BLOOD RIGHT HAND  Final   Special Requests BOTTLES DRAWN AEROBIC ONLY 1ML  Final   Culture  Setup Time   Final    11/16/2014 05:47 Performed at [facility]    Culture   Final           BLOOD CULTURE RECEIVED NO GROWTH TO DATE CULTURE WILL BE HELD FOR 5 DAYS BEFORE ISSUING A FINAL NEGATIVE REPORT Performed at [facility]    Report Status PENDING  Incomplete  Culture, blood (routine x 2)     Status: None (Preliminary result)   Collection Time: 11/15/14  7:07 PM  Result Value Ref Range Status   Specimen Description BLOOD RIGHT FOREARM  Final   Special Requests BOTTLES DRAWN AEROBIC ONLY 2ML  Final   Culture  Setup Time   Final    11/16/2014 05:47 Performed at [facility]    Culture   Final           BLOOD CULTURE RECEIVED NO GROWTH TO DATE CULTURE WILL BE HELD FOR 5 DAYS BEFORE ISSUING A FINAL NEGATIVE REPORT Performed at [facility]    Report Status PENDING  Incomplete  MRSA PCR Screening     Status: Abnormal   Collection Time: 11/15/14 10:34 PM  Result Value Ref Range Status   MRSA by PCR POSITIVE (A) NEGATIVE Final    Comment:        The GeneXpert MRSA Assay (FDA approved for NASAL specimens only), is one component of a comprehensive MRSA colonization surveillance program. It is not intended to diagnose MRSA infection nor to guide or monitor treatment for MRSA infections. RESULT CALLED TO, READ BACK BY AND VERIFIED WITH: L.HUDSON,RN AT 0219 ON 11/16/14 BY SHEA.W   Clostridium Difficile by PCR     Status: None   Collection Time: 11/17/14 12:18 AM  Result Value Ref Range Status   C difficile by pcr NEGATIVE NEGATIVE Final    Comment: Performed at [facility]      Studies: No results found.  Scheduled Meds: . antiseptic oral rinse  7 mL Mouth Rinse q12n4p  . budesonide (PULMICORT) nebulizer solution  0.25 mg Nebulization BID  . chlorhexidine  15 mL Mouth Rinse  BID  . Chlorhexidine Gluconate  Cloth  6 each Topical Q0600  . enoxaparin (LOVENOX) injection  30 mg Subcutaneous Q24H  . latanoprost  1 drop Both Eyes QHS  . metoprolol succinate  25 mg Oral Daily  . mupirocin ointment  1 application Nasal BID  . piperacillin-tazobactam (ZOSYN)  IV  3.375 g Intravenous Q8H  . saccharomyces boulardii  250 mg Oral BID   Continuous Infusions: . sodium chloride 75 mL/hr at 11/16/14 2013    Principal Problem:   SIRS (systemic inflammatory response syndrome) Active Problems:   Atrial fibrillation   CHF (congestive heart failure)   Hypertension   Dementia   CAD (coronary artery disease)   CKD (chronic kidney disease), stage IV   Leukocytosis   Sepsis    Time spent: 30 minutes    Vassie Loll  [facility] Pager [phone]. If 7PM-7AM, please contact night-coverage at [email or web address], password [facility] 11/17/2014, 4:52 PM  LOS: 2 days

## 2014-11-18 ENCOUNTER — Inpatient Hospital Stay (HOSPITAL_COMMUNITY): Payer: Medicare Other

## 2014-11-18 DIAGNOSIS — J690 Pneumonitis due to inhalation of food and vomit: Secondary | ICD-10-CM

## 2014-11-18 DIAGNOSIS — N189 Chronic kidney disease, unspecified: Secondary | ICD-10-CM

## 2014-11-18 DIAGNOSIS — I4891 Unspecified atrial fibrillation: Secondary | ICD-10-CM

## 2014-11-18 DIAGNOSIS — N179 Acute kidney failure, unspecified: Secondary | ICD-10-CM

## 2014-11-18 LAB — INFLUENZA PANEL BY PCR (TYPE A & B)
H1N1FLUPCR: NOT DETECTED
Influenza A By PCR: NEGATIVE
Influenza B By PCR: NEGATIVE

## 2014-11-18 MED ORDER — AMLODIPINE BESYLATE 5 MG PO TABS
5.0000 mg | ORAL_TABLET | Freq: Every day | ORAL | Status: DC
  Administered 2014-11-18 – 2014-11-19 (×2): 5 mg via ORAL
  Filled 2014-11-18 (×3): qty 1

## 2014-11-18 MED ORDER — SODIUM CHLORIDE 0.9 % IV SOLN
INTRAVENOUS | Status: DC
  Administered 2014-11-18 – 2014-11-19 (×2): via INTRAVENOUS
  Filled 2014-11-18 (×4): qty 1000

## 2014-11-18 NOTE — Evaluation (Signed)
Physical Therapy Evaluation Patient Details Name: Kristi DubinKatherine K Belknap MRN: [id number] DOB: 01/28/1931 Today's Date: 11/18/2014   History of Present Illness  Pt is an 80 y.o. female with a history of CAD, A.fib, CHF, HTN, and dementia residing [facility] in [facility] care and admitted with fever and ?aspiration pneumonia.  Clinical Impression  Pt admitted with above diagnosis. Pt currently with functional limitations due to the deficits listed below (see PT Problem List).   Pt currently +2 assist for bed mobility and felt unable to stand today due to not feeling well and weakness.  Pt poor historian and uncertain of baseline functioning.  If ALF unable to provide increased assist, pt may need increased level of care.  Pt will benefit from skilled PT to increase their independence and safety with mobility to allow discharge to the venue listed below.  Will see on trial basis.      Follow Up Recommendations SNF;Supervision/Assistance - 24 hour    Equipment Recommendations  None recommended by PT    Recommendations for Other Services       Precautions / Restrictions Precautions Precautions: Fall      Mobility  Bed Mobility Overal bed mobility: Needs Assistance;+2 for physical assistance Bed Mobility: Rolling;Supine to Sit;Sit to Supine Rolling: Total assist;+2 for physical assistance   Supine to sit: Total assist;+2 for physical assistance Sit to supine: Total assist;+2 for physical assistance   General bed mobility comments: pt agreeable to get up to use bathroom however upon assisting EOB, pt had soiled linen, pt felt unable to attempt standing upon sitting stating "not feeling well" and requested return to supine, then required assist for rolling (left pt with NT to finish with bathing and changing linen)  Transfers Overall transfer level:  (pt felt unable today, increased assist for bed mobility)                  Ambulation/Gait                 Stairs            Wheelchair Mobility    Modified Rankin (Stroke Patients Only)       Balance Overall balance assessment: Needs assistance Sitting-balance support: Bilateral upper extremity supported;Feet supported Sitting balance-Leahy Scale: Fair                                       Pertinent Vitals/Pain Pain Assessment: No/denies pain    Home Living Family/patient expects to be discharged to:: Assisted living                 Additional Comments: pt poor historian, per chart from  ALF memory care    Prior Function           Comments: uncertain assist provided from ALF and pt poor historian but reports she is able to ambulate     Hand Dominance        Extremity/Trunk Assessment               Lower Extremity Assessment: Generalized weakness;RLE deficits/detail;LLE deficits/detail;Difficult to assess due to impaired cognition RLE Deficits / Details: difficult to assess however pt requiring assist during session despite increased time to perform and multimodal cues LLE Deficits / Details: difficult to assess however pt requiring assist during session despite increased time to perform and multimodal cues     Communication  Communication: No difficulties  Cognition Arousal/Alertness: Awake/alert Behavior During Therapy: Agitated (with being cold, easily calmed) Overall Cognitive Status: History of cognitive impairments - at baseline (hx dementia, memory care unit)                      General Comments      Exercises        Assessment/Plan    PT Assessment Patient needs continued PT services  PT Diagnosis Difficulty walking;Generalized weakness   PT Problem List Decreased strength;Decreased activity tolerance;Decreased mobility  PT Treatment Interventions DME instruction;Functional mobility training;Therapeutic activities;Therapeutic exercise;Patient/family education;Gait training;Wheelchair mobility training    PT Goals (Current goals can be found in the Care Plan section) Acute Rehab PT Goals PT Goal Formulation: Patient unable to participate in goal setting Time For Goal Achievement: 12/02/14 Potential to Achieve Goals: Fair    Frequency Min 2X/week   Barriers to discharge        Co-evaluation               End of Session   Activity Tolerance: Patient limited by fatigue Patient left: in bed;with nursing/sitter in room;with call bell/phone within reach Nurse Communication: Mobility status (NT in to assist with bathing and changing linen)         Time: [id number] PT Time Calculation (min) (ACUTE ONLY): 19 min   Charges:   PT Evaluation $Initial PT Evaluation Tier I: 1 Procedure PT Treatments $Therapeutic Activity: 8-22 mins   PT G Codes:          Shealyn Sean,KATHrine E 11/18/2014, 11:54 AM Zenovia JarredKati Nishant Schrecengost, PT, DPT 11/18/2014 Pager: [phone]

## 2014-11-18 NOTE — Progress Notes (Signed)
Received report from Ene and agree with assessment. 

## 2014-11-18 NOTE — Progress Notes (Signed)
Subjective: No acute events.  Feels well.  Objective: Vital signs in last 24 hours: Temp:  [97.9 F (36.6 C)-101.1 F (38.4 C)] 99.3 F (37.4 C) (12/19 0602) Pulse Rate:  [50-83] 65 (12/19 0602) Resp:  [18-20] 20 (12/19 0602) BP: (130-177)/(57-84) 157/84 mmHg (12/19 0602) SpO2:  [95 %-100 %] 95 % (12/19 0840) Weight:  [50.472 kg (111 lb 4.3 oz)] 50.472 kg (111 lb 4.3 oz) (12/18 1309) Last BM Date: 11/17/14  Intake/Output from previous day: 12/18 0701 - 12/19 0700 In: 1560 [P.O.:360; I.V.:1100; IV Piggyback:100] Out: -  Intake/Output this shift:    General appearance: alert and no distress GI: soft, non-tender; bowel sounds normal; no masses,  no organomegaly  Lab Results:  Recent Labs  11/15/14 1729 11/16/14 0343 11/17/14 0417  WBC 25.4* 21.5* 27.0*  HGB 10.4* 9.0* 8.8*  HCT 33.9* 28.6* 27.4*  PLT 256 240 222   BMET  Recent Labs  11/15/14 1729 11/16/14 0343 11/17/14 0417  NA 141 144 144  K 4.5 3.2* 3.6*  CL 101 103 108  CO2 25 23 22   GLUCOSE 111* 84 108*  BUN 38* 32* 28*  CREATININE 1.93* 1.79* 1.64*  CALCIUM 9.8 8.9 8.9   LFT  Recent Labs  11/15/14 1729  PROT 7.5  ALBUMIN 3.1*  AST 14  ALT 6  ALKPHOS 90  BILITOT 0.2*   PT/INR No results for input(s): LABPROT, INR in the last 72 hours. Hepatitis Panel No results for input(s): HEPBSAG, HCVAB, HEPAIGM, HEPBIGM in the last 72 hours. C-Diff No results for input(s): CDIFFTOX in the last 72 hours. Fecal Lactopherrin No results for input(s): FECLLACTOFRN in the last 72 hours.  Studies/Results: No results found.  Medications:  Scheduled: . antiseptic oral rinse  7 mL Mouth Rinse q12n4p  . budesonide (PULMICORT) nebulizer solution  0.25 mg Nebulization BID  . chlorhexidine  15 mL Mouth Rinse BID  . Chlorhexidine Gluconate Cloth  6 each Topical Q0600  . enoxaparin (LOVENOX) injection  30 mg Subcutaneous Q24H  . latanoprost  1 drop Both Eyes QHS  . metoprolol succinate  25 mg Oral Daily  .  mupirocin ointment  1 application Nasal BID  . piperacillin-tazobactam (ZOSYN)  IV  3.375 g Intravenous Q8H  . saccharomyces boulardii  250 mg Oral BID   Continuous: . sodium chloride 75 mL/hr at 11/16/14 2013    Assessment/Plan: 1) Fever. 2) Leukocystosis. 3) ? Aspiration pneumonia.   The patient's lung fields are clear.  Her Tmax was at 101 and her WBC did increase.  No complaints of dysphagia or coughing at this time and the CXR was negative for any pneumonia.  Plan: 1) Continue with antibiotics. 2) Await esophagram.   LOS: 3 days   Kristi [name] D 11/18/2014, 9:03 AM

## 2014-11-18 NOTE — Progress Notes (Signed)
PROGRESS NOTE  Kristi [name]:[id number] DOB: Nov 24, 1931 DOA: 11/15/2014 PCP: No primary care provider on file.  Assessment/Plan: Sepsis -Present at the time of admission -Secondary to aspiration pneumonia/HCAP -Repeat chest x-ray 11/18/2014 shows right lower lobe focal consolidation -Check influenza PCR -IV fluids HCAP/Aspiration pneumonia -11/18/14 repeat CXR shows focal consolidation RLL  -continue Zosyn FUO -Patient has persistent fever -Influenza PCR -C. difficile PCR negative -CT abdomen and pelvis -11/15/2014 blood cultures negative -11/15/2014 urinalysis is negative for pyuria Acute on chronic renal Failure (CKD stage 3) -Secondary to sepsis and volume depletion -Baseline creatinine 1.1-1.4 -Continue IV fluids Atrial fibrillation -Rate controlled -Continue metoprolol succinate Hypertension  -Continue metoprolol succinate  -Restart amlodipine  -Dysphagia with hx of esophageal stricture -Barium esophagram--neg for stricture, shows esophageal dysmotility  Dementia  -Continue Aricept and Namenda  Chronic diastolic CHF  -Compensated  -Hold Lasix  -Judicious IV fluids   Family Communication:   No family at beside Disposition Plan:   Home when medically stable       Procedures/Studies: Dg Chest 2 View  11/18/2014   CLINICAL DATA:  Shortness of breath  EXAM: CHEST  2 VIEW  COMPARISON:  Chest radiograph 11/15/2014  FINDINGS: Stable enlarged cardiac and mediastinal contours with tortuosity and calcification of thoracic aorta. Interval development of focal heterogeneous opacity within the right lung base. Thoracic spine degenerative change.  IMPRESSION: New focal consolidative opacity within the peripheral right lower lobe, concerning for infection in the appropriate clinical setting. Atelectasis would be an alternative consideration. Recommend short term radiographic followup to ensure resolution.   Electronically Signed   By: Annia Belt M.D.    On: 11/18/2014 12:11   Dg Chest 2 View  11/15/2014   CLINICAL DATA:  80 year old female with abnormal shaking, chills. Initial encounter.  EXAM: CHEST  2 VIEW  COMPARISON:  12/17/2011.  FINDINGS: Low lung volumes. Chronic cardiomegaly. Stable cardiac size and mediastinal contours. No pneumothorax or pulmonary edema. No pleural effusion or consolidation. Eventration of the diaphragm re - identified. No acute osseous abnormality identified. Stable cholecystectomy clips.  IMPRESSION: Low lung volumes, otherwise no acute cardiopulmonary abnormality.   Electronically Signed   By: Augusto Gamble M.D.   On: 11/15/2014 16:35   Dg Esophagus  11/18/2014   CLINICAL DATA:  PT C/O DYSPHAGIA, N/V W/MEALS, FAMILY REPORTS H/O ESOPHAGEAL STRICTURE WHICH REQUIRED DILATION "A LONG TIME AGO"  EXAM: ESOPHOGRAM/BARIUM SWALLOW  TECHNIQUE: Single contrast examination was performed using  thin barium.  FLUOROSCOPY TIME:  1 min and 34 seconds  COMPARISON:  None.  FINDINGS: Pharyngeal swallowing function is grossly normal with no aspiration visualized.  Esophagus is normal in course. There is a small sliding hiatal hernia. There is no convincing stricture. No esophageal mass and no evidence of esophagitis. Study was somewhat limited by patient's probability to only take small swallows.  There is esophageal dysmotility with significant tertiary contractions and barium stasis. No reflux was documented during the exam.  IMPRESSION: 1. Esophageal dysmotility. 2. No convincing mass or stricture. 3. Small hiatal hernia.   Electronically Signed   By: Amie Portland M.D.   On: 11/18/2014 12:39         Subjective:  patient is pleasant but confused. No reports of chest pain, shortness of breath, abdominal pain, vomiting, diarrhea. She has multiple loose stools.   Objective: Filed Vitals:   11/18/14 0133 11/18/14 0319 11/18/14 0602 11/18/14 0840  BP: 168/74  157/84   Pulse: 83  65   Temp: 101.1 F (38.4 C) 98.6 F (37 C) 99.3 F  (37.4 C)   TempSrc: Oral Oral Oral   Resp: 20  20   Height:      Weight:      SpO2: 96%  97% 95%    Intake/Output Summary (Last 24 hours) at 11/18/14 1326 Last data filed at 11/18/14 0851  Gross per 24 hour  Intake   1680 ml  Output      0 ml  Net   1680 ml   Weight change:  Exam:   General:  Pt is alert, does not follow commands appropriately, not in acute distress  HEENT: No icterus, No thrush,  [place]/AT  Cardiovascular: IRRR, S1/S2, no rubs, no gallops  Respiratory: bibasilar crackles. Left clear to auscultation. No wheezing.   Abdomen: Soft/+BS, non tender, non distended, no guarding  Extremities: No edema, No lymphangitis, No petechiae, No rashes, no synovitis  Data Reviewed: Basic Metabolic Panel:  Recent Labs Lab 11/15/14 1729 11/16/14 0343 11/17/14 0417  NA 141 144 144  K 4.5 3.2* 3.6*  CL 101 103 108  CO2 25 23 22   GLUCOSE 111* 84 108*  BUN 38* 32* 28*  CREATININE 1.93* 1.79* 1.64*  CALCIUM 9.8 8.9 8.9  MG  --   --  2.3   Liver Function Tests:  Recent Labs Lab 11/15/14 1729  AST 14  ALT 6  ALKPHOS 90  BILITOT 0.2*  PROT 7.5  ALBUMIN 3.1*   No results for input(s): LIPASE, AMYLASE in the last 168 hours. No results for input(s): AMMONIA in the last 168 hours. CBC:  Recent Labs Lab 11/15/14 1729 11/16/14 0343 11/17/14 0417  WBC 25.4* 21.5* 27.0*  NEUTROABS 22.5*  --   --   HGB 10.4* 9.0* 8.8*  HCT 33.9* 28.6* 27.4*  MCV 99.7 98.3 96.8  PLT 256 240 222   Cardiac Enzymes: No results for input(s): CKTOTAL, CKMB, CKMBINDEX, TROPONINI in the last 168 hours. BNP: Invalid input(s): POCBNP CBG: No results for input(s): GLUCAP in the last 168 hours.  Recent Results (from the past 240 hour(s))  Culture, blood (routine x 2)     Status: None (Preliminary result)   Collection Time: 11/15/14  6:59 PM  Result Value Ref Range Status   Specimen Description BLOOD RIGHT HAND  Final   Special Requests BOTTLES DRAWN AEROBIC ONLY 1ML  Final    Culture  Setup Time   Final    11/16/2014 05:47 Performed at [facility]    Culture   Final           BLOOD CULTURE RECEIVED NO GROWTH TO DATE CULTURE WILL BE HELD FOR 5 DAYS BEFORE ISSUING A FINAL NEGATIVE REPORT Performed at [facility]    Report Status PENDING  Incomplete  Culture, blood (routine x 2)     Status: None (Preliminary result)   Collection Time: 11/15/14  7:07 PM  Result Value Ref Range Status   Specimen Description BLOOD RIGHT FOREARM  Final   Special Requests BOTTLES DRAWN AEROBIC ONLY 2ML  Final   Culture  Setup Time   Final    11/16/2014 05:47 Performed at [facility]    Culture   Final           BLOOD CULTURE RECEIVED NO GROWTH TO DATE CULTURE WILL BE HELD FOR 5 DAYS BEFORE ISSUING A FINAL NEGATIVE REPORT Performed at [facility]    Report Status PENDING  Incomplete  MRSA  PCR Screening     Status: Abnormal   Collection Time: 11/15/14 10:34 PM  Result Value Ref Range Status   MRSA by PCR POSITIVE (A) NEGATIVE Final    Comment:        The GeneXpert MRSA Assay (FDA approved for NASAL specimens only), is one component of a comprehensive MRSA colonization surveillance program. It is not intended to diagnose MRSA infection nor to guide or monitor treatment for MRSA infections. RESULT CALLED TO, READ BACK BY AND VERIFIED WITH: L.HUDSON,RN AT 0219 ON 11/16/14 BY SHEA.W   Clostridium Difficile by PCR     Status: None   Collection Time: 11/17/14 12:18 AM  Result Value Ref Range Status   C difficile by pcr NEGATIVE NEGATIVE Final    Comment: Performed at [facility]     Scheduled Meds: . antiseptic oral rinse  7 mL Mouth Rinse q12n4p  . budesonide (PULMICORT) nebulizer solution  0.25 mg Nebulization BID  . chlorhexidine  15 mL Mouth Rinse BID  . Chlorhexidine Gluconate Cloth  6 each Topical Q0600  . enoxaparin (LOVENOX) injection  30 mg Subcutaneous Q24H  . latanoprost  1 drop Both Eyes QHS  . metoprolol  succinate  25 mg Oral Daily  . mupirocin ointment  1 application Nasal BID  . piperacillin-tazobactam (ZOSYN)  IV  3.375 g Intravenous Q8H  . saccharomyces boulardii  250 mg Oral BID   Continuous Infusions: . sodium chloride 75 mL/hr at 11/16/14 2013     Jamien Casanova, DO  [facility] Pager [phone]  If 7PM-7AM, please contact night-coverage [email or web address] Password [email or web address] 11/18/2014, 1:26 PM   LOS: 3 days

## 2014-11-18 NOTE — Progress Notes (Signed)
[phone] [employer] affairs [employer]  September, 2012  . CHF (congestive heart failure)     Combined systolic and diastolic, EF 40-45%, 2012  . Ejection fraction     EF 40-45%, echo, September, 2012  . History of noncompliance with medical treatment   . Hypertension   . Dyslipidemia   . Dementia      ? mild ?  . CAD (coronary artery disease)     PCI to LAD early 1990s  . Degenerative joint disease   . Chest pain     Chest wall pain and costochondritis  . CKD (chronic kidney disease), stage IV   . Edema     Left greater than right lower extremity, No DVT by Doppler, September, 2012  . Aortic insufficiency     Mild, echo, September, 2012  . Mitral regurgitation     Moderate, echo, 2012  . Pulmonary hypertension      46 mmHg, echo, 2012  . Hyperlipidemia   . Bradycardia     October, 2012  . Fatigue     October, 2012    Medications:  Scheduled:  . antiseptic oral rinse  7 mL Mouth Rinse q12n4p  . budesonide (PULMICORT) nebulizer solution  0.25 mg Nebulization BID  . chlorhexidine  15 mL Mouth Rinse BID  . Chlorhexidine Gluconate Cloth  6 each Topical Q0600  . enoxaparin (LOVENOX) injection  30 mg Subcutaneous Q24H  . latanoprost  1 drop Both Eyes QHS  . metoprolol succinate  25 mg Oral Daily  . mupirocin ointment  1 application Nasal BID  . piperacillin-tazobactam (ZOSYN)  IV  3.375 g Intravenous Q8H  . saccharomyces boulardii  250 mg Oral BID   Infusions:  . sodium chloride 75 mL/hr at 11/16/14 2013   PRN:   Assessment: 80 yo female with dementia presents from nursing facility for shaking and shivering. Pharmacy originally consulted to dose vancomycin and zosyn for r/o sepsis.  Vancomycin discontinued on 12/18.  12/16 >> Vancomycin >>12/18 12/16 >> Zosyn >>  Tmax: 101.1 WBC: 27k Renal: SCr 1.64, CrCl ~21 ml/min  12/16 blood x2:NGTD  Goal of Therapy:  Zosyn dose appropriate for indication, renal function  Plan:  Zosyn 3.375gm IV q8h (4hr extended infusions) Follow renal function / clinical course  Kristi [name] RPh 11/18/2014, 1:33 PM Pager [phone]

## 2014-11-19 ENCOUNTER — Inpatient Hospital Stay (HOSPITAL_COMMUNITY): Payer: Medicare Other

## 2014-11-19 DIAGNOSIS — R1310 Dysphagia, unspecified: Secondary | ICD-10-CM | POA: Insufficient documentation

## 2014-11-19 LAB — BASIC METABOLIC PANEL
ANION GAP: 18 — AB (ref 5–15)
BUN: 22 mg/dL (ref 6–23)
CO2: 18 mEq/L — ABNORMAL LOW (ref 19–32)
CREATININE: 1.48 mg/dL — AB (ref 0.50–1.10)
Calcium: 9.5 mg/dL (ref 8.4–10.5)
Chloride: 111 mEq/L (ref 96–112)
GFR calc Af Amer: 37 mL/min — ABNORMAL LOW (ref >90)
GFR calc non Af Amer: 32 mL/min — ABNORMAL LOW (ref >90)
Glucose, Bld: 111 mg/dL — ABNORMAL HIGH (ref 70–99)
Potassium: 3.3 mEq/L — ABNORMAL LOW (ref 3.7–5.3)
Sodium: 147 mEq/L (ref 137–147)

## 2014-11-19 LAB — LACTIC ACID, PLASMA: Lactic Acid, Venous: 1.5 mmol/L (ref 0.5–2.2)

## 2014-11-19 LAB — CBC
HEMATOCRIT: 28.4 % — AB (ref 36.0–46.0)
HEMOGLOBIN: 9 g/dL — AB (ref 12.0–15.0)
MCH: 30.6 pg (ref 26.0–34.0)
MCHC: 31.7 g/dL (ref 30.0–36.0)
MCV: 96.6 fL (ref 78.0–100.0)
Platelets: 267 10*3/uL (ref 150–400)
RBC: 2.94 MIL/uL — ABNORMAL LOW (ref 3.87–5.11)
RDW: 13.3 % (ref 11.5–15.5)
WBC: 25.8 10*3/uL — AB (ref 4.0–10.5)

## 2014-11-19 MED ORDER — METOPROLOL SUCCINATE ER 50 MG PO TB24: 50.0000 mg | ORAL_TABLET | Freq: Every day | ORAL | Status: DC

## 2014-11-19 MED ORDER — METOPROLOL SUCCINATE ER 25 MG PO TB24
25.0000 mg | ORAL_TABLET | Freq: Once | ORAL | Status: AC
  Administered 2014-11-19: 25 mg via ORAL
  Filled 2014-11-19: qty 1

## 2014-11-19 MED ORDER — POTASSIUM CHLORIDE CRYS ER 20 MEQ PO TBCR
20.0000 meq | EXTENDED_RELEASE_TABLET | Freq: Once | ORAL | Status: AC
  Administered 2014-11-19: 20 meq via ORAL
  Filled 2014-11-19: qty 1

## 2014-11-19 MED ORDER — POTASSIUM CHLORIDE 2 MEQ/ML IV SOLN
INTRAVENOUS | Status: DC
  Administered 2014-11-19 – 2014-11-20 (×2): via INTRAVENOUS
  Filled 2014-11-19 (×3): qty 1000

## 2014-11-19 MED ORDER — ASPIRIN 81 MG PO CHEW
81.0000 mg | CHEWABLE_TABLET | Freq: Every day | ORAL | Status: DC
  Filled 2014-11-19: qty 1

## 2014-11-19 NOTE — Progress Notes (Signed)
[facility] progress note. Chief complaint. Abnormal CT scan abdomen This 80 year old female admitted with fever and chills thought secondary to sepsis and community-acquired versus aspiration pneumonia. Patient has continued with fevers and leukocytosis despite antibiotic therapy with Zosyn. To further evaluate the scan of the abdomen was obtained and this result has been called to me. CT findings indicated multiple extraluminal air collections present within the abdomen and pelvis, primarily within the retroperitoneum, consistent with a perforated viscus. Because most of the areas in the right upper quadrant, this could be due to a perforated duodenal ulcer. Alternatively, findings could be secondary to a perforated sigmoid diverticulitis. There is no free air or drainable fluid collection. I came up to see the patient at bedside. She is alert and in no distress. Denies any abdominal pain. Vital signs. Temperature 100.4, pulse 72, respiration 20, blood pressure 168/86. O2 sats 95%. General appearance. Frail elderly female who is alert and in no distress. Cardiac. Rate and rhythm regular. Lungs. Breath sounds are reduced in the bases. Abdomen. Soft with very hypotonic bowel sounds. No distention or rigidity. No pain, guarding, rebound tenderness. Impression/plan. Problem #1. Multiple extraluminal air collections rule out perforated viscus. Discussed the case with Dr. Maisie Fushomas general surgery who has kindly consented to see the patient in consult. She agrees with current antibiotics and recommends no changes. I make patient nothing by mouth in case surgery is required. We'll follow for surgery's recommendations.

## 2014-11-19 NOTE — Consult Note (Signed)
Reason for Consult:abnormal imaging, sepsis Referring Physician: Dr Tat  Kristi [name] is an 80 y.o. female.  HPI: patient admitted for fevers and chills.  She is being treated for pneumonia.  She continued to have fevers and a CT was ordered.  This showed signs of perforated viscus.  She denies nausea, vomiting, abd pain, constipation or diarrhea  Past Medical History  Diagnosis Date  . Atrial fibrillation     Rapid response, hospital, September, 2012  . CHF (congestive heart failure)     Combined systolic and diastolic, EF 03-47%, 4259  . Ejection fraction     EF 40-45%, echo, September, 2012  . History of noncompliance with medical treatment   . Hypertension   . Dyslipidemia   . Dementia      ? mild ?  . CAD (coronary artery disease)     PCI to LAD early 1990s  . Degenerative joint disease   . Chest pain     Chest wall pain and costochondritis  . CKD (chronic kidney disease), stage IV   . Edema     Left greater than right lower extremity, No DVT by Doppler, September, 2012  . Aortic insufficiency     Mild, echo, September, 2012  . Mitral regurgitation     Moderate, echo, 2012  . Pulmonary hypertension     46 mmHg, echo, 2012  . Hyperlipidemia   . Bradycardia     October, 2012  . Fatigue     October, 2012    Past Surgical History  Procedure Laterality Date  . Cardiac catheterization    . Cataract extraction      Family History  Problem Relation Age of Onset  . Pneumonia Mother     died  . Other Father     died on a fire  . Coronary artery disease Brother     2 brothers    Social History:  reports that she has quit smoking. She has never used smokeless tobacco. She reports that she does not drink alcohol or use illicit drugs.  Allergies:  Allergies  Allergen Reactions  . Atenolol Other (See Comments)    unknown  . Ramipril Cough  . Simvastatin Other (See Comments)    unknown    Medications: I have reviewed the patient's current  medications.  Results for orders placed or performed during the hospital encounter of 11/15/14 (from the past 48 hour(s))  Influenza panel by PCR (type A & B, H1N1)     Status: None   Collection Time: 11/18/14  2:20 PM  Result Value Ref Range   Influenza A By PCR NEGATIVE NEGATIVE   Influenza B By PCR NEGATIVE NEGATIVE   H1N1 flu by pcr NOT DETECTED NOT DETECTED    Comment:        The Xpert Flu assay (FDA approved for nasal aspirates or washes and nasopharyngeal swab specimens), is intended as an aid in the diagnosis of influenza and should not be used as a sole basis for treatment. Performed at [facility] metabolic panel     Status: Abnormal   Collection Time: 11/19/14  4:25 AM  Result Value Ref Range   Sodium 147 137 - 147 mEq/L   Potassium 3.3 (L) 3.7 - 5.3 mEq/L   Chloride 111 96 - 112 mEq/L   CO2 18 (L) 19 - 32 mEq/L   Glucose, Bld 111 (H) 70 - 99 mg/dL   BUN 22 6 - 23 mg/dL  Creatinine, Ser 1.48 (H) 0.50 - 1.10 mg/dL   Calcium 9.5 8.4 - 10.5 mg/dL   GFR calc non Af Amer 32 (L) >90 mL/min   GFR calc Af Amer 37 (L) >90 mL/min    Comment: (NOTE) The eGFR has been calculated using the CKD EPI equation. This calculation has not been validated in all clinical situations. eGFR's persistently <90 mL/min signify possible Chronic Kidney Disease.    Anion gap 18 (H) 5 - 15  CBC     Status: Abnormal   Collection Time: 11/19/14  4:25 AM  Result Value Ref Range   WBC 25.8 (H) 4.0 - 10.5 K/uL   RBC 2.94 (L) 3.87 - 5.11 MIL/uL   Hemoglobin 9.0 (L) 12.0 - 15.0 g/dL   HCT 28.4 (L) 36.0 - 46.0 %   MCV 96.6 78.0 - 100.0 fL   MCH 30.6 26.0 - 34.0 pg   MCHC 31.7 30.0 - 36.0 g/dL   RDW 13.3 11.5 - 15.5 %   Platelets 267 150 - 400 K/uL  Lactic acid, plasma     Status: None   Collection Time: 11/19/14  4:25 AM  Result Value Ref Range   Lactic Acid, Venous 1.5 0.5 - 2.2 mmol/L    Ct Abdomen Pelvis Wo Contrast  11/19/2014   CLINICAL DATA:  Fever and chills.  Leukocytosis with presumed aspiration pneumonia. History of coronary artery disease, dementia and renal insufficiency. Initial encounter.  EXAM: CT ABDOMEN AND PELVIS WITHOUT CONTRAST  TECHNIQUE: Multidetector CT imaging of the abdomen and pelvis was performed following the standard protocol without IV contrast.  COMPARISON:  Chest and abdominal radiographs 11/18/2014. Report from abdominal CT 01/29/2001.  FINDINGS: Lower chest: There are small to moderate dependent pleural effusions bilaterally with associated dependent bibasilar airspace opacities. These are most consistent with atelectasis. There is no consolidation or significant pericardial effusion. Extensive atherosclerosis of the coronary arteries and thoracic aorta noted. The heart is enlarged.  Hepatobiliary: The liver demonstrates decreased density consistent with steatosis. No focal lesions are identified on non contrast imaging. The gallbladder is surgically absent. There is air within the extrahepatic and left intrahepatic biliary system.  Pancreas:  Unremarkable on noncontrast imaging.  Spleen: Normal in size without focal abnormality.  Adrenals/Urinary Tract: Both adrenal glands are somewhat indistinct without focal mass or hematoma.The left kidney demonstrates asymmetric cortical thinning and atrophy. There is some extraluminal gas around the left renal hilum, but no definite air within its collecting system. There is no evidence of hydronephrosis or ureteral calculus. The bladder appears unremarkable.  Stomach/Bowel: Contrast from recent esophagram is present within the colon. There is no colonic wall thickening or extravasated enteric contrast. Diverticular changes are present throughout the colon. There is no definite bowel wall thickening or focal surrounding inflammation. However, multiple extraluminal air bubbles are present. These are most pronounced within the right upper quadrant, inferior to the cholecystectomy bed. Extraluminal air tracks  inferiorly along the retroperitoneum. There are several extraluminal air bubbles within the pelvis.There is no free intraperitoneal air or drainable fluid collection.  Vascular/Lymphatic: There are no enlarged abdominal or pelvic lymph nodes. Aortoiliac atherosclerosis noted.  Reproductive: There are vascular calcifications within the uterus. No evidence of adnexal mass.  Other: There is generalized soft tissue edema affecting the subcutaneous and mesenteric fat. No abdominal wall hernia identified.  Musculoskeletal: No acute or significant osseous findings. There are degenerative changes throughout the spine with a grade 1 anterolisthesis at the lower 3 lumbar disc space levels.  IMPRESSION: 1.  Multiple extraluminal air collections are present within the abdomen and pelvis, primarily within the retroperitoneum, consistent with a perforated viscus. Because most of the air is in the right upper quadrant, this could be due to a perforated duodenal ulcer. Alternatively, findings could be secondary to perforated sigmoid diverticulitis. There is no free air or drainable fluid collection. 2. Pneumobilia, probably related to previous sphincterotomy or biliary bypass. 3. Bilateral pleural effusions with dependent opacities at both lung bases. No definite aspiration pneumonia demonstrated by this examination. 4. These results were called by telephone at the time of interpretation on 11/19/2014 at 7:50 pm to the patient's nurse, Kristi [name], who verbally acknowledged these results.   Electronically Signed   By: Camie Patience M.D.   On: 11/19/2014 19:53   Dg Chest 2 View  11/18/2014   CLINICAL DATA:  Shortness of breath  EXAM: CHEST  2 VIEW  COMPARISON:  Chest radiograph 11/15/2014  FINDINGS: Stable enlarged cardiac and mediastinal contours with tortuosity and calcification of thoracic aorta. Interval development of focal heterogeneous opacity within the right lung base. Thoracic spine degenerative change.  IMPRESSION: New focal  consolidative opacity within the peripheral right lower lobe, concerning for infection in the appropriate clinical setting. Atelectasis would be an alternative consideration. Recommend short term radiographic followup to ensure resolution.   Electronically Signed   By: Lovey Newcomer M.D.   On: 11/18/2014 12:11   Dg Esophagus  11/18/2014   CLINICAL DATA:  PT C/O DYSPHAGIA, N/V W/MEALS, FAMILY REPORTS H/O ESOPHAGEAL STRICTURE WHICH REQUIRED DILATION "A LONG TIME AGO"  EXAM: ESOPHOGRAM/BARIUM SWALLOW  TECHNIQUE: Single contrast examination was performed using  thin barium.  FLUOROSCOPY TIME:  1 min and 34 seconds  COMPARISON:  None.  FINDINGS: Pharyngeal swallowing function is grossly normal with no aspiration visualized.  Esophagus is normal in course. There is a small sliding hiatal hernia. There is no convincing stricture. No esophageal mass and no evidence of esophagitis. Study was somewhat limited by patient's probability to only take small swallows.  There is esophageal dysmotility with significant tertiary contractions and barium stasis. No reflux was documented during the exam.  IMPRESSION: 1. Esophageal dysmotility. 2. No convincing mass or stricture. 3. Small hiatal hernia.   Electronically Signed   By: Lajean Manes M.D.   On: 11/18/2014 12:39   Dg Abd Portable 1v  11/18/2014   CLINICAL DATA:  Evaluate for clearance of barium from esophagram. Patient scheduled for CT.  EXAM: PORTABLE ABDOMEN - 1 VIEW  COMPARISON:  Earlier same day  FINDINGS: Residual barium contrast material is demonstrated throughout the colon to the level of the rectum. Relative paucity of small bowel gas. Right upper quadrant surgical clips. Lumbar spine degenerative change.  IMPRESSION: Residual barium is demonstrated throughout the colon to the level of the rectum.   Electronically Signed   By: Lovey Newcomer M.D.   On: 11/18/2014 17:32    Review of Systems  Constitutional: Positive for fever, chills and malaise/fatigue.  Eyes:  Negative for blurred vision.  Respiratory: Negative for cough and shortness of breath.   Cardiovascular: Negative for chest pain.  Gastrointestinal: Negative for nausea, vomiting, abdominal pain, diarrhea and constipation.  Genitourinary: Negative for dysuria, urgency and frequency.  Skin: Negative for rash.  Neurological: Negative for dizziness and headaches.   Blood pressure 177/83, pulse 78, temperature 99.3 F (37.4 C), temperature source Oral, resp. rate 20, height 5' (1.524 m), weight 118 lb 6.2 oz (53.7 kg), SpO2 96 %. Physical Exam  Constitutional: No  distress.  HENT:  Head: Normocephalic and atraumatic.  Eyes: Conjunctivae are normal. Pupils are equal, round, and reactive to light.  Neck: Normal range of motion. Neck supple.  Cardiovascular: Normal rate and regular rhythm.   Respiratory: Effort normal and breath sounds normal.  GI: Soft. She exhibits no distension. There is no tenderness. There is no rebound and no guarding.  Musculoskeletal: Normal range of motion.  Neurological: She is alert.  Skin: Skin is warm and dry. She is not diaphoretic.    Assessment/Plan: 80 y.o. F with signs of perforated viscus on Ct.  Pt with absolutely no abd symptoms and normal physical exam.  Vitals stable.  No indications for emergent surgery.  Will follow fever curve and wbc.  If this doesn't improve, she may benefit from laparoscopic evaluation and abd washout.  Cont Zosyn and bowel rest for now.  Kynadie Yaun C. [id number]/78/6767, 10:47 PM

## 2014-11-19 NOTE — Progress Notes (Addendum)
Patient BP in the 170s, temp 99.2, PRN med given for BP and temp, notified Dr. Arbutus Leasat. Will continue to assess patient.

## 2014-11-19 NOTE — Clinical Social Work Note (Signed)
  CSW met with pt and her daughter Enid Derry at bedside  CSW presented bed offers to pt and daughter and daughter stated that she will speak to her sister and they will make a decision by Monday  Pt's daughter has a daughter in law that works at [employer] and this may be her choice after she discusses with her sister  CSW will follow up with pt until discharge  .Dede Query, LCSW [facility] Clinical Social Worker - Weekend Coverage cell #: [phone]

## 2014-11-19 NOTE — Progress Notes (Signed)
PROGRESS NOTE  Kristi [name] ZOX:[id number] DOB: [birth year] DOA: 11/15/2014 PCP: No primary care provider on file.  Brief history 80 year old female with a history ofCAD, atrial fibrillation, dementia, hypertension, dyslipidemia, CKD was sent to the emergency department from her nursing facility when she was observed by staff to be complianing of shivering and chills. The patient was found have a temperature of 101.28F in the emergency department. At baseline, the patient is able to walk without an assistive device and feed herself. The patient was noted to have leukocytosis, fever. She was treated for presumptive aspiration pneumonia. She was started on vancomycin and Zosyn. Gastroenterology was consulted. They ordered a barium swallow which did not reveal a stricture. He did not feel that any further intervention was necessary. Assessment/Plan: Sepsis -Present at the time of admission -Secondary to aspiration pneumonia/HCAP -Repeat chest x-ray 11/18/2014 shows right lower lobe focal consolidation -Check influenza PCR--neg -IV fluids HCAP/Aspiration pneumonia -11/18/14 repeat CXR shows focal consolidation RLL  -continue Zosyn FUO -Patient has persistent fever--defervescence in the last 24 hours -Influenza PCR--neg -C. difficile PCR negative -CT abdomen and pelvis and CT chest if continues or fever -11/15/2014 blood cultures negative -11/15/2014 urinalysis is negative for pyuria Acute on chronic renal Failure (CKD stage 3) -Secondary to sepsis and volume depletion -Baseline creatinine 1.1-1.4 -Continue IV fluids Atrial fibrillation -Rate controlled -Continue metoprolol succinate -start ASA Hypertension  -increase metoprolol succinate to 50mg  daily -12/19--Restart amlodipine  -Dysphagia with hx of esophageal stricture -Barium esophagram--neg for stricture, shows esophageal dysmotility  Dementia  -Continue Aricept and Namenda  Chronic diastolic CHF   -Compensated  -Hold Lasix  -Judicious IV fluids  Hypernatremia -change to hypotonic fluid Family Communication: Daughter updated family at beside Disposition Plan: Home when medically stable         Procedures/Studies: Dg Chest 2 View  11/18/2014   CLINICAL DATA:  Shortness of breath  EXAM: CHEST  2 VIEW  COMPARISON:  Chest radiograph 11/15/2014  FINDINGS: Stable enlarged cardiac and mediastinal contours with tortuosity and calcification of thoracic aorta. Interval development of focal heterogeneous opacity within the right lung base. Thoracic spine degenerative change.  IMPRESSION: New focal consolidative opacity within the peripheral right lower lobe, concerning for infection in the appropriate clinical setting. Atelectasis would be an alternative consideration. Recommend short term radiographic followup to ensure resolution.   Electronically Signed   By: Annia Belt M.D.   On: 11/18/2014 12:11   Dg Chest 2 View  11/15/2014   CLINICAL DATA:  80 year old female with abnormal shaking, chills. Initial encounter.  EXAM: CHEST  2 VIEW  COMPARISON:  12/17/2011.  FINDINGS: Low lung volumes. Chronic cardiomegaly. Stable cardiac size and mediastinal contours. No pneumothorax or pulmonary edema. No pleural effusion or consolidation. Eventration of the diaphragm re - identified. No acute osseous abnormality identified. Stable cholecystectomy clips.  IMPRESSION: Low lung volumes, otherwise no acute cardiopulmonary abnormality.   Electronically Signed   By: Augusto Gamble M.D.   On: 11/15/2014 16:35   Dg Esophagus  11/18/2014   CLINICAL DATA:  PT C/O DYSPHAGIA, N/V W/MEALS, FAMILY REPORTS H/O ESOPHAGEAL STRICTURE WHICH REQUIRED DILATION "A LONG TIME AGO"  EXAM: ESOPHOGRAM/BARIUM SWALLOW  TECHNIQUE: Single contrast examination was performed using  thin barium.  FLUOROSCOPY TIME:  1 min and 34 seconds  COMPARISON:  None.  FINDINGS: Pharyngeal swallowing function is grossly normal with no aspiration  visualized.  Esophagus is normal in course. There is a small sliding hiatal hernia.  There is no convincing stricture. No esophageal mass and no evidence of esophagitis. Study was somewhat limited by patient's probability to only take small swallows.  There is esophageal dysmotility with significant tertiary contractions and barium stasis. No reflux was documented during the exam.  IMPRESSION: 1. Esophageal dysmotility. 2. No convincing mass or stricture. 3. Small hiatal hernia.   Electronically Signed   By: Amie Portlandavid  Ormond M.D.   On: 11/18/2014 12:39   Dg Abd Portable 1v  11/18/2014   CLINICAL DATA:  Evaluate for clearance of barium from esophagram. Patient scheduled for CT.  EXAM: PORTABLE ABDOMEN - 1 VIEW  COMPARISON:  Earlier same day  FINDINGS: Residual barium contrast material is demonstrated throughout the colon to the level of the rectum. Relative paucity of small bowel gas. Right upper quadrant surgical clips. Lumbar spine degenerative change.  IMPRESSION: Residual barium is demonstrated throughout the colon to the level of the rectum.   Electronically Signed   By: Annia Beltrew  Davis M.D.   On: 11/18/2014 17:32         Subjective:   Objective: Filed Vitals:   11/19/14 0755 11/19/14 1034 11/19/14 1406 11/19/14 1518  BP:  177/90 179/88 158/76  Pulse:  67 69 80  Temp:   99.2 F (37.3 C)   TempSrc:   Oral   Resp:   20   Height:      Weight:      SpO2: 96%  93%     Intake/Output Summary (Last 24 hours) at 11/19/14 1657 Last data filed at 11/19/14 1300  Gross per 24 hour  Intake   1685 ml  Output      0 ml  Net   1685 ml   Weight change: 3.228 kg (7 lb 1.9 oz) Exam:   General:  Pt is alert, follows commands appropriately, not in acute distress  HEENT: No icterus, No thrush, No neck mass, [place]/AT  Cardiovascular: RRR, S1/S2, no rubs, no gallops  Respiratory: CTA bilaterally, no wheezing, no crackles, no rhonchi  Abdomen: Soft/+BS, non tender, non distended, no  guarding  Extremities: No edema, No lymphangitis, No petechiae, No rashes, no synovitis  Data Reviewed: Basic Metabolic Panel:  Recent Labs Lab 11/15/14 1729 11/16/14 0343 11/17/14 0417 11/19/14 0425  NA 141 144 144 147  K 4.5 3.2* 3.6* 3.3*  CL 101 103 108 111  CO2 25 23 22  18*  GLUCOSE 111* 84 108* 111*  BUN 38* 32* 28* 22  CREATININE 1.93* 1.79* 1.64* 1.48*  CALCIUM 9.8 8.9 8.9 9.5  MG  --   --  2.3  --    Liver Function Tests:  Recent Labs Lab 11/15/14 1729  AST 14  ALT 6  ALKPHOS 90  BILITOT 0.2*  PROT 7.5  ALBUMIN 3.1*   No results for input(s): LIPASE, AMYLASE in the last 168 hours. No results for input(s): AMMONIA in the last 168 hours. CBC:  Recent Labs Lab 11/15/14 1729 11/16/14 0343 11/17/14 0417 11/19/14 0425  WBC 25.4* 21.5* 27.0* 25.8*  NEUTROABS 22.5*  --   --   --   HGB 10.4* 9.0* 8.8* 9.0*  HCT 33.9* 28.6* 27.4* 28.4*  MCV 99.7 98.3 96.8 96.6  PLT 256 240 222 267   Cardiac Enzymes: No results for input(s): CKTOTAL, CKMB, CKMBINDEX, TROPONINI in the last 168 hours. BNP: Invalid input(s): POCBNP CBG: No results for input(s): GLUCAP in the last 168 hours.  Recent Results (from the past 240 hour(s))  Culture, blood (routine x 2)  Status: None (Preliminary result)   Collection Time: 11/15/14  6:59 PM  Result Value Ref Range Status   Specimen Description BLOOD RIGHT HAND  Final   Special Requests BOTTLES DRAWN AEROBIC ONLY 1ML  Final   Culture  Setup Time   Final    11/16/2014 05:47 [facility]formed [facility]    Culture   Final           BLOOD CULTURE RECEIVED NO GROWTH TO DATE CULTURE WILL BE HELD FOR 5 DAYS BEFORE ISSUING A FINAL NEGATIVE REPORT Performed at [facility]    Report Status PENDING  Incomplete  Culture, blood (routine x 2)     Status: None (Preliminary result)   Collection Time: 11/15/14  7:07 PM  Result Value Ref Range Status   Specimen Description BLOOD RIGHT FOREARM  Final   Special Requests  BOTTLES DRAWN AEROBIC ONLY 2ML  Final   Culture  Setup Time   Final    11/16/2014 05:47 Performed at [facility]    Culture   Final           BLOOD CULTURE RECEIVED NO GROWTH TO DATE CULTURE WILL BE HELD FOR 5 DAYS BEFORE ISSUING A FINAL NEGATIVE REPORT Performed at [facility]    Report Status PENDING  Incomplete  MRSA PCR Screening     Status: Abnormal   Collection Time: 11/15/14 10:34 PM  Result Value Ref Range Status   MRSA by PCR POSITIVE (A) NEGATIVE Final    Comment:        The GeneXpert MRSA Assay (FDA approved for NASAL specimens only), is one component of a comprehensive MRSA colonization surveillance program. It is not intended to diagnose MRSA infection nor to guide or monitor treatment for MRSA infections. RESULT CALLED TO, READ BACK BY AND VERIFIED WITH: L.HUDSON,RN AT 0219 ON 11/16/14 BY [email or web address]   Clostridium Difficile by PCR     Status: None   Collection Time: 11/17/14 12:18 AM  Result Value Ref Range Status   C difficile by pcr NEGATIVE NEGATIVE Final    Comment: [facility]     Scheduled Meds: . amLODipine  5 mg Oral Daily  . antiseptic oral rinse  7 mL Mouth Rinse q12n4p  . budesonide (PULMICORT) nebulizer solution  0.25 mg Nebulization BID  . chlorhexidine  15 mL Mouth Rinse BID  . Chlorhexidine Gluconate Cloth  6 each Topical Q0600  . enoxaparin (LOVENOX) injection  30 mg Subcutaneous Q24H  . latanoprost  1 drop Both Eyes QHS  . metoprolol succinate  25 mg Oral Once  . [START ON 11/20/2014] metoprolol succinate  50 mg Oral Daily  . mupirocin ointment  1 application Nasal BID  . piperacillin-tazobactam (ZOSYN)  IV  3.375 g Intravenous Q8H  . saccharomyces boulardii  250 mg Oral BID   Continuous Infusions: . sodium chloride 75 mL/hr at 11/16/14 2013  . sodium chloride 0.9 % 1,000 mL with potassium chloride 20 mEq infusion 75 mL/hr at 11/19/14 0412     Millette Halberstam, DO  [facility] Pager  [phone]  If 7PM-7AM, please contact night-coverage [email or web address] Password [email or web address] 11/19/2014, 4:57 PM   LOS: 4 days

## 2014-11-19 NOTE — Progress Notes (Signed)
Progress Note for [facility]  Subjective: No complaints.  Objective: Vital signs in last 24 hours: Temp:  [98.2 F (36.8 C)-100.2 F (37.9 C)] 98.2 F (36.8 C) (12/20 0334) Pulse Rate:  [107-117] 112 (12/20 0334) Resp:  [18-28] 28 (12/20 0334) BP: (136-161)/(70-101) 161/101 mmHg (12/20 0334) SpO2:  [95 %-100 %] 98 % (12/20 0334) Weight:  [53.7 kg (118 lb 6.2 oz)] 53.7 kg (118 lb 6.2 oz) (12/20 0334) Last BM Date: 11/18/14  Intake/Output from previous day: 12/19 0701 - 12/20 0700 In: 420 [P.O.:120; I.V.:300] Out: -  Intake/Output this shift:    General appearance: alert and no distress GI: soft, non-tender; bowel sounds normal; no masses,  no organomegaly  Lab Results:  Recent Labs  11/17/14 0417 11/19/14 0425  WBC 27.0* 25.8*  HGB 8.8* 9.0*  HCT 27.4* 28.4*  PLT 222 267   BMET  Recent Labs  11/17/14 0417 11/19/14 0425  NA 144 147  K 3.6* 3.3*  CL 108 111  CO2 22 18*  GLUCOSE 108* 111*  BUN 28* 22  CREATININE 1.64* 1.48*  CALCIUM 8.9 9.5   LFT No results for input(s): PROT, ALBUMIN, AST, ALT, ALKPHOS, BILITOT, BILIDIR, IBILI in the last 72 hours. PT/INR No results for input(s): LABPROT, INR in the last 72 hours. Hepatitis Panel No results for input(s): HEPBSAG, HCVAB, HEPAIGM, HEPBIGM in the last 72 hours. C-Diff No results for input(s): CDIFFTOX in the last 72 hours. Fecal Lactopherrin No results for input(s): FECLLACTOFRN in the last 72 hours.  Studies/Results: Dg Chest 2 View  11/18/2014   CLINICAL DATA:  Shortness of breath  EXAM: CHEST  2 VIEW  COMPARISON:  Chest radiograph 11/15/2014  FINDINGS: Stable enlarged cardiac and mediastinal contours with tortuosity and calcification of thoracic aorta. Interval development of focal heterogeneous opacity within the right lung base. Thoracic spine degenerative change.  IMPRESSION: New focal consolidative opacity within the peripheral right lower lobe, concerning for infection in the appropriate clinical  setting. Atelectasis would be an alternative consideration. Recommend short term radiographic followup to ensure resolution.   Electronically Signed   By: Annia Beltrew  Davis M.D.   On: 11/18/2014 12:11   Dg Esophagus  11/18/2014   CLINICAL DATA:  PT C/O DYSPHAGIA, N/V W/MEALS, FAMILY REPORTS H/O ESOPHAGEAL STRICTURE WHICH REQUIRED DILATION "A LONG TIME AGO"  EXAM: ESOPHOGRAM/BARIUM SWALLOW  TECHNIQUE: Single contrast examination was performed using  thin barium.  FLUOROSCOPY TIME:  1 min and 34 seconds  COMPARISON:  None.  FINDINGS: Pharyngeal swallowing function is grossly normal with no aspiration visualized.  Esophagus is normal in course. There is a small sliding hiatal hernia. There is no convincing stricture. No esophageal mass and no evidence of esophagitis. Study was somewhat limited by patient's probability to only take small swallows.  There is esophageal dysmotility with significant tertiary contractions and barium stasis. No reflux was documented during the exam.  IMPRESSION: 1. Esophageal dysmotility. 2. No convincing mass or stricture. 3. Small hiatal hernia.   Electronically Signed   By: Amie Portlandavid  Ormond M.D.   On: 11/18/2014 12:39   Dg Abd Portable 1v  11/18/2014   CLINICAL DATA:  Evaluate for clearance of barium from esophagram. Patient scheduled for CT.  EXAM: PORTABLE ABDOMEN - 1 VIEW  COMPARISON:  Earlier same day  FINDINGS: Residual barium contrast material is demonstrated throughout the colon to the level of the rectum. Relative paucity of small bowel gas. Right upper quadrant surgical clips. Lumbar spine degenerative change.  IMPRESSION: Residual barium is demonstrated  throughout the colon to the level of the rectum.   Electronically Signed   By: Annia Beltrew  Davis M.D.   On: 11/18/2014 17:32    Medications:  Scheduled: . amLODipine  5 mg Oral Daily  . antiseptic oral rinse  7 mL Mouth Rinse q12n4p  . budesonide (PULMICORT) nebulizer solution  0.25 mg Nebulization BID  . chlorhexidine  15 mL  Mouth Rinse BID  . Chlorhexidine Gluconate Cloth  6 each Topical Q0600  . enoxaparin (LOVENOX) injection  30 mg Subcutaneous Q24H  . latanoprost  1 drop Both Eyes QHS  . metoprolol succinate  25 mg Oral Daily  . mupirocin ointment  1 application Nasal BID  . piperacillin-tazobactam (ZOSYN)  IV  3.375 g Intravenous Q8H  . saccharomyces boulardii  250 mg Oral BID   Continuous: . sodium chloride 75 mL/hr at 11/16/14 2013  . sodium chloride 0.9 % 1,000 mL with potassium chloride 20 mEq infusion 75 mL/hr at 11/19/14 [id number]    Assessment/Plan: 1) Fever. 2) Leukocytosis.   Esophagram was negative.  No evidence of any aspiration or reflux.  There is esophageal dysmotility, but this is not unexpected with her age.  Plan: 1) No further GI intervention. 2) Signing off.   LOS: 4 days   Kristi [name] D 11/19/2014, 7:34 AM

## 2014-11-20 ENCOUNTER — Encounter (HOSPITAL_COMMUNITY)
Admission: EM | Disposition: A | Discharge status: Skilled Nursing Facility | Payer: Self-pay | Source: Home / Self Care | Attending: Internal Medicine

## 2014-11-20 ENCOUNTER — Encounter (HOSPITAL_COMMUNITY): Payer: Self-pay | Admitting: Anesthesiology

## 2014-11-20 ENCOUNTER — Inpatient Hospital Stay (HOSPITAL_COMMUNITY): Payer: Medicare Other | Admitting: Certified Registered Nurse Anesthetist

## 2014-11-20 DIAGNOSIS — K668 Other specified disorders of peritoneum: Secondary | ICD-10-CM

## 2014-11-20 HISTORY — PX: COLON RESECTION: SHX5231

## 2014-11-20 HISTORY — PX: LAPAROSCOPY ABDOMEN DIAGNOSTIC: PRO50

## 2014-11-20 LAB — CBC WITH DIFFERENTIAL/PLATELET
Basophils Absolute: 0 10*3/uL (ref 0.0–0.1)
Basophils Relative: 0 % (ref 0–1)
Eosinophils Absolute: 0 10*3/uL (ref 0.0–0.7)
Eosinophils Relative: 0 % (ref 0–5)
HEMATOCRIT: 27.6 % — AB (ref 36.0–46.0)
HEMOGLOBIN: 8.9 g/dL — AB (ref 12.0–15.0)
LYMPHS ABS: 1.6 10*3/uL (ref 0.7–4.0)
LYMPHS PCT: 7 % — AB (ref 12–46)
MCH: 31.1 pg (ref 26.0–34.0)
MCHC: 32.2 g/dL (ref 30.0–36.0)
MCV: 96.5 fL (ref 78.0–100.0)
MONO ABS: 1.1 10*3/uL — AB (ref 0.1–1.0)
Monocytes Relative: 5 % (ref 3–12)
Neutro Abs: 19.9 10*3/uL — ABNORMAL HIGH (ref 1.7–7.7)
Neutrophils Relative %: 88 % — ABNORMAL HIGH (ref 43–77)
Platelets: 278 10*3/uL (ref 150–400)
RBC: 2.86 MIL/uL — AB (ref 3.87–5.11)
RDW: 13.5 % (ref 11.5–15.5)
WBC: 22.6 10*3/uL — AB (ref 4.0–10.5)

## 2014-11-20 LAB — BASIC METABOLIC PANEL
Anion gap: 14 (ref 5–15)
BUN: 18 mg/dL (ref 6–23)
CO2: 20 meq/L (ref 19–32)
Calcium: 9.4 mg/dL (ref 8.4–10.5)
Chloride: 112 mEq/L (ref 96–112)
Creatinine, Ser: 1.39 mg/dL — ABNORMAL HIGH (ref 0.50–1.10)
GFR calc Af Amer: 40 mL/min — ABNORMAL LOW (ref >90)
GFR calc non Af Amer: 35 mL/min — ABNORMAL LOW (ref >90)
GLUCOSE: 128 mg/dL — AB (ref 70–99)
POTASSIUM: 3.1 meq/L — AB (ref 3.7–5.3)
Sodium: 146 mEq/L (ref 137–147)

## 2014-11-20 LAB — CLOSTRIDIUM DIFFICILE BY PCR: Toxigenic C. Difficile by PCR: NEGATIVE

## 2014-11-20 LAB — SURGICAL PCR SCREEN
MRSA, PCR: POSITIVE — AB
STAPHYLOCOCCUS AUREUS: POSITIVE — AB

## 2014-11-20 LAB — TYPE AND SCREEN
ABO/RH(D): O POS
Antibody Screen: NEGATIVE

## 2014-11-20 LAB — ABO/RH: ABO/RH(D): O POS

## 2014-11-20 LAB — MAGNESIUM: MAGNESIUM: 2 mg/dL (ref 1.5–2.5)

## 2014-11-20 SURGERY — COLON RESECTION LAPAROSCOPIC
Anesthesia: General | Site: Abdomen

## 2014-11-20 MED ORDER — ONDANSETRON HCL 4 MG/2ML IJ SOLN
INTRAMUSCULAR | Status: DC | PRN
Start: 2014-11-20 — End: 2014-11-20
  Administered 2014-11-20: 4 mg via INTRAVENOUS

## 2014-11-20 MED ORDER — POTASSIUM CHLORIDE 10 MEQ/100ML IV SOLN
INTRAVENOUS | Status: DC | PRN
  Administered 2014-11-20 (×2): 10 meq via INTRAVENOUS

## 2014-11-20 MED ORDER — HYDROMORPHONE HCL 1 MG/ML IJ SOLN
0.5000 mg | INTRAMUSCULAR | Status: DC | PRN
  Administered 2014-11-25 – 2014-11-26 (×5): 1 mg via INTRAVENOUS
  Filled 2014-11-20 (×5): qty 1

## 2014-11-20 MED ORDER — ONDANSETRON HCL 4 MG/2ML IJ SOLN: 4.0000 mg | Freq: Four times a day (QID) | INTRAMUSCULAR | Status: DC | PRN

## 2014-11-20 MED ORDER — FENTANYL CITRATE 0.05 MG/ML IJ SOLN: 25.0000 ug | INTRAMUSCULAR | Status: DC | PRN

## 2014-11-20 MED ORDER — PIPERACILLIN-TAZOBACTAM 3.375 G IVPB
3.3750 g | Freq: Three times a day (TID) | INTRAVENOUS | Status: DC
  Administered 2014-11-20 – 2014-11-26 (×17): 3.375 g via INTRAVENOUS
  Filled 2014-11-20 (×18): qty 50

## 2014-11-20 MED ORDER — LIDOCAINE HCL (CARDIAC) 20 MG/ML IV SOLN
INTRAVENOUS | Status: AC
  Filled 2014-11-20: qty 5

## 2014-11-20 MED ORDER — PROPOFOL 10 MG/ML IV BOLUS
INTRAVENOUS | Status: AC
  Filled 2014-11-20: qty 20

## 2014-11-20 MED ORDER — SODIUM CHLORIDE 0.9 % IJ SOLN
INTRAMUSCULAR | Status: AC
  Filled 2014-11-20: qty 10

## 2014-11-20 MED ORDER — NEOSTIGMINE METHYLSULFATE 10 MG/10ML IV SOLN
INTRAVENOUS | Status: AC
  Filled 2014-11-20: qty 1

## 2014-11-20 MED ORDER — FENTANYL CITRATE 0.05 MG/ML IJ SOLN
INTRAMUSCULAR | Status: AC
  Filled 2014-11-20: qty 5

## 2014-11-20 MED ORDER — ENOXAPARIN SODIUM 30 MG/0.3ML ~~LOC~~ SOLN
30.0000 mg | SUBCUTANEOUS | Status: DC
  Filled 2014-11-20: qty 0.3

## 2014-11-20 MED ORDER — LIDOCAINE HCL (CARDIAC) 20 MG/ML IV SOLN
INTRAVENOUS | Status: DC | PRN
  Administered 2014-11-20: 80 mg via INTRAVENOUS

## 2014-11-20 MED ORDER — CISATRACURIUM BESYLATE (PF) 10 MG/5ML IV SOLN
INTRAVENOUS | Status: DC | PRN
  Administered 2014-11-20: 2 mg via INTRAVENOUS
  Administered 2014-11-20: 4 mg via INTRAVENOUS

## 2014-11-20 MED ORDER — BUPIVACAINE-EPINEPHRINE (PF) 0.5% -1:200000 IJ SOLN
INTRAMUSCULAR | Status: AC
  Filled 2014-11-20: qty 30

## 2014-11-20 MED ORDER — ONDANSETRON HCL 4 MG PO TABS: 4.0000 mg | ORAL_TABLET | Freq: Four times a day (QID) | ORAL | Status: DC | PRN

## 2014-11-20 MED ORDER — ATROPINE SULFATE 0.4 MG/ML IJ SOLN
INTRAMUSCULAR | Status: AC
  Filled 2014-11-20: qty 2

## 2014-11-20 MED ORDER — CISATRACURIUM BESYLATE 20 MG/10ML IV SOLN
INTRAVENOUS | Status: AC
  Filled 2014-11-20: qty 10

## 2014-11-20 MED ORDER — GLYCOPYRROLATE 0.2 MG/ML IJ SOLN
INTRAMUSCULAR | Status: DC | PRN
  Administered 2014-11-20: .4 mg via INTRAVENOUS

## 2014-11-20 MED ORDER — ENOXAPARIN SODIUM 30 MG/0.3ML ~~LOC~~ SOLN
30.0000 mg | SUBCUTANEOUS | Status: DC
  Administered 2014-11-21 – 2014-11-26 (×5): 30 mg via SUBCUTANEOUS
  Filled 2014-11-20 (×8): qty 0.3

## 2014-11-20 MED ORDER — LACTATED RINGERS IV SOLN
INTRAVENOUS | Status: DC | PRN
  Administered 2014-11-20: 14:00:00 via INTRAVENOUS

## 2014-11-20 MED ORDER — NEOSTIGMINE METHYLSULFATE 10 MG/10ML IV SOLN
INTRAVENOUS | Status: DC | PRN
Start: 2014-11-20 — End: 2014-11-20
  Administered 2014-11-20: 3 mg via INTRAVENOUS

## 2014-11-20 MED ORDER — BUPIVACAINE-EPINEPHRINE 0.5% -1:200000 IJ SOLN
INTRAMUSCULAR | Status: DC | PRN
  Administered 2014-11-20: 15 mL

## 2014-11-20 MED ORDER — POTASSIUM CHLORIDE IN NACL 20-0.9 MEQ/L-% IV SOLN
INTRAVENOUS | Status: DC
  Administered 2014-11-20 – 2014-11-23 (×6): via INTRAVENOUS
  Filled 2014-11-20 (×8): qty 1000

## 2014-11-20 MED ORDER — FENTANYL CITRATE 0.05 MG/ML IJ SOLN
INTRAMUSCULAR | Status: DC | PRN
  Administered 2014-11-20 (×3): 50 ug via INTRAVENOUS
  Administered 2014-11-20: 25 ug via INTRAVENOUS

## 2014-11-20 MED ORDER — ONDANSETRON HCL 4 MG/2ML IJ SOLN
INTRAMUSCULAR | Status: AC
  Filled 2014-11-20: qty 2

## 2014-11-20 MED ORDER — SODIUM CHLORIDE 0.9 % IR SOLN
Status: DC | PRN
  Administered 2014-11-20: 1000 mL

## 2014-11-20 MED ORDER — POTASSIUM CHLORIDE CRYS ER 20 MEQ PO TBCR
40.0000 meq | EXTENDED_RELEASE_TABLET | Freq: Once | ORAL | Status: DC
Start: 2014-11-20 — End: 2014-11-20
  Filled 2014-11-20: qty 2

## 2014-11-20 MED ORDER — POTASSIUM CHLORIDE 10 MEQ/100ML IV SOLN
10.0000 meq | INTRAVENOUS | Status: DC
  Administered 2014-11-20 (×2): 10 meq via INTRAVENOUS
  Filled 2014-11-20 (×5): qty 100

## 2014-11-20 MED ORDER — EPHEDRINE SULFATE 50 MG/ML IJ SOLN
INTRAMUSCULAR | Status: AC
  Filled 2014-11-20: qty 1

## 2014-11-20 MED ORDER — SUCCINYLCHOLINE CHLORIDE 20 MG/ML IJ SOLN
INTRAMUSCULAR | Status: DC | PRN
  Administered 2014-11-20: 100 mg via INTRAVENOUS

## 2014-11-20 MED ORDER — PROPOFOL 10 MG/ML IV BOLUS
INTRAVENOUS | Status: DC | PRN
  Administered 2014-11-20: 150 mg via INTRAVENOUS

## 2014-11-20 MED ORDER — GLYCOPYRROLATE 0.2 MG/ML IJ SOLN
INTRAMUSCULAR | Status: AC
  Filled 2014-11-20: qty 2

## 2014-11-20 MED ORDER — 0.9 % SODIUM CHLORIDE (POUR BTL) OPTIME
TOPICAL | Status: DC | PRN
  Administered 2014-11-20 (×2): 1000 mL

## 2014-11-20 MED ORDER — METOPROLOL TARTRATE 1 MG/ML IV SOLN
2.5000 mg | Freq: Four times a day (QID) | INTRAVENOUS | Status: DC
  Administered 2014-11-20 – 2014-11-21 (×5): 2.5 mg via INTRAVENOUS
  Filled 2014-11-20 (×5): qty 5

## 2014-11-20 SURGICAL SUPPLY — 52 items
APPLIER CLIP 5 13 M/L LIGAMAX5 (MISCELLANEOUS)
APPLIER CLIP ROT 10 11.4 M/L (STAPLE)
APR CLP MED LRG 11.4X10 (STAPLE)
APR CLP MED LRG 5 ANG JAW (MISCELLANEOUS)
BLADE EXTENDED COATED 6.5IN (ELECTRODE) ×3 IMPLANT
BLADE HEX COATED 2.75 (ELECTRODE) ×6 IMPLANT
CELLS DAT CNTRL 66122 CELL SVR (MISCELLANEOUS) IMPLANT
CLIP APPLIE 5 13 M/L LIGAMAX5 (MISCELLANEOUS) IMPLANT
CLIP APPLIE ROT 10 11.4 M/L (STAPLE) IMPLANT
CLOSURE WOUND 1/2 X4 (GAUZE/BANDAGES/DRESSINGS)
COUNTER NEEDLE 20 DBL MAG RED (NEEDLE) ×3 IMPLANT
DECANTER SPIKE VIAL GLASS SM (MISCELLANEOUS) ×3 IMPLANT
DRAIN CHANNEL 19F RND (DRAIN) ×4 IMPLANT
DRAPE LAPAROSCOPIC ABDOMINAL (DRAPES) ×3 IMPLANT
DRAPE UTILITY XL STRL (DRAPES) ×6 IMPLANT
DRSG OPSITE POSTOP 4X10 (GAUZE/BANDAGES/DRESSINGS) IMPLANT
DRSG OPSITE POSTOP 4X6 (GAUZE/BANDAGES/DRESSINGS) IMPLANT
DRSG OPSITE POSTOP 4X8 (GAUZE/BANDAGES/DRESSINGS) IMPLANT
ELECT REM PT RETURN 9FT ADLT (ELECTROSURGICAL) ×3
ELECTRODE REM PT RTRN 9FT ADLT (ELECTROSURGICAL) ×1 IMPLANT
EVACUATOR SILICONE 100CC (DRAIN) ×4 IMPLANT
GAUZE SPONGE 4X4 12PLY STRL (GAUZE/BANDAGES/DRESSINGS) IMPLANT
GLOVE EUDERMIC 7 POWDERFREE (GLOVE) ×6 IMPLANT
GOWN STRL REUS W/TWL XL LVL3 (GOWN DISPOSABLE) ×12 IMPLANT
LEGGING LITHOTOMY PAIR STRL (DRAPES) ×3 IMPLANT
LIGASURE IMPACT 36 18CM CVD LR (INSTRUMENTS) IMPLANT
PACK COLON (CUSTOM PROCEDURE TRAY) ×3 IMPLANT
RETRACTOR WND ALEXIS 18 MED (MISCELLANEOUS) IMPLANT
RTRCTR WOUND ALEXIS 18CM MED (MISCELLANEOUS)
SCISSORS LAP 5X35 DISP (ENDOMECHANICALS) ×3 IMPLANT
SET IRRIG TUBING LAPAROSCOPIC (IRRIGATION / IRRIGATOR) ×3 IMPLANT
SHEARS HARMONIC ACE PLUS 36CM (ENDOMECHANICALS) IMPLANT
SLEEVE XCEL OPT CAN 5 100 (ENDOMECHANICALS) ×6 IMPLANT
SOLUTION ANTI FOG 6CC (MISCELLANEOUS) ×3 IMPLANT
STAPLER VISISTAT 35W (STAPLE) ×5 IMPLANT
STRIP CLOSURE SKIN 1/2X4 (GAUZE/BANDAGES/DRESSINGS) IMPLANT
SUT ETHILON 3 0 PS 1 (SUTURE) ×4 IMPLANT
SUT PDS AB 1 CT1 27 (SUTURE) IMPLANT
SUT PDS AB 1 TP1 96 (SUTURE) IMPLANT
SUT SILK 2 0 (SUTURE)
SUT SILK 2 0 SH CR/8 (SUTURE) ×1 IMPLANT
SUT SILK 2-0 18XBRD TIE 12 (SUTURE) ×1 IMPLANT
SUT SILK 3 0 (SUTURE)
SUT SILK 3 0 SH CR/8 (SUTURE) ×1 IMPLANT
SUT SILK 3-0 18XBRD TIE 12 (SUTURE) ×1 IMPLANT
SUT VICRYL 0 UR6 27IN ABS (SUTURE) ×2 IMPLANT
TOWEL OR NON WOVEN STRL DISP B (DISPOSABLE) ×6 IMPLANT
TRAY FOLEY CATH 14FRSI W/METER (CATHETERS) ×3 IMPLANT
TROCAR BLADELESS OPT 5 100 (ENDOMECHANICALS) ×3 IMPLANT
TROCAR XCEL BLUNT TIP 100MML (ENDOMECHANICALS) ×2 IMPLANT
TROCAR XCEL NON-BLD 11X100MML (ENDOMECHANICALS) IMPLANT
TUBING INSUFFLATION 10FT LAP (TUBING) ×3 IMPLANT

## 2014-11-20 NOTE — Transfer of Care (Signed)
Immediate Anesthesia Transfer of Care Note  Patient: Kristi DubinKatherine K Brandhorst  Procedure(s) Performed: Procedure(s): Diagnostic laparoscopy with placement of drains (N/A)  Patient Location: PACU  Anesthesia Type:General  Level of Consciousness: awake, alert , oriented and patient cooperative  Airway & Oxygen Therapy: Patient Spontanous Breathing and Patient connected to face mask oxygen  Post-op Assessment: Report given to PACU RN, Post -op Vital signs reviewed and stable and Patient moving all extremities  Post vital signs: Reviewed and stable  Complications: No apparent anesthesia complications

## 2014-11-20 NOTE — Progress Notes (Signed)
Call back received from Lenny Pastelom Callahan NP regarding patient abdominal CT results. Informed of impression noting multiple extraluminal air collections. Informed him impression is lengthy and he will review results. Update given on assessment findings, patient has hypoactive bowel sounds at this time and that patient denies pain at this time. Also informed him patient has had several loose bowel movements today and is currently on enteric precautions to rule out c-diff.  Also informed him that patient is on dysphagia 3 diet at this time. Per Lenny Pastelom Callahan NP he will place orders for NPO status and consult for surgery.

## 2014-11-20 NOTE — Progress Notes (Signed)
Pt blood pressure 168/64. 5mg  of hydrazaline given.

## 2014-11-20 NOTE — Anesthesia Postprocedure Evaluation (Signed)
  Anesthesia Post-op Note  Patient: Kristi [name]  Procedure(s) Performed: Procedure(s) (LRB): Diagnostic laparoscopy with placement of drains (N/A)  Patient Location: PACU  Anesthesia Type: General  Level of Consciousness: awake and alert   Airway and Oxygen Therapy: Patient Spontanous Breathing  Post-op Pain: mild  Post-op Assessment: Post-op Vital signs reviewed, Patient's Cardiovascular Status Stable, Respiratory Function Stable, Patent Airway and No signs of Nausea or vomiting  Last Vitals:  Filed Vitals:   11/20/14 1731  BP: 170/75  Pulse: 69  Temp: 37 C  Resp: 20    Post-op Vital Signs: stable   Complications: No apparent anesthesia complications

## 2014-11-20 NOTE — Progress Notes (Signed)
IV potassium given late because pt IV was leaking.  Pt was a hard stick, had to wait for IV team to start new IV.  Started as soon as new IV was given. Made surgical transport aware that two potassium runs still needed to be given.  Pt consent was signed by daughter, Lynnell DikeSusie RN preformed all other pre procedure items.

## 2014-11-20 NOTE — Progress Notes (Signed)
PROGRESS NOTE  Kristi DubinKatherine K [name] ZOX:[id number] DOB: 05-Nov-1931 DOA: 11/15/2014 PCP: No primary care provider on file.   Brief history 80 year old female with a history ofCAD, atrial fibrillation, dementia, hypertension, dyslipidemia, CKD was sent to the emergency department from her nursing facility when she was observed by staff to be complianing of shivering and chills. The patient was found have a temperature of 101.48F in the emergency department. At baseline, the patient is able to walk without an assistive device and feed herself. The patient was noted to have leukocytosis, fever. She was treated for presumptive aspiration pneumonia/HCAP. She was started on vancomycin and Zosyn. Gastroenterology was consulted. They ordered a barium swallow which did not reveal a stricture. He did not feel that any further intervention was necessary. The patient continued to have fever and leukocytosis. As result, CT of the abdomen and pelvis was ordered which showed Multiple extraluminal air collections are present within the abdomen and pelvis, primarily within the retroperitoneum, consistent with a perforated viscus.  As a result, general surgery was consulted.  On 11/20/2014, Dr. Derrell LollingIngram took pt for diagnostic laparoscopy. Assessment/Plan: Sepsis -Present at the time of admission -Secondary to aspiration pneumonia/HCAP and peritonitis -Repeat chest x-ray 11/18/2014 shows right lower lobe focal consolidation -Check influenza PCR--neg -IV fluids Pneumoperitoneum -concerned about perforated viscus with peritonitis -appreciate Dr. Derrell LollingIngram -11/20/14--case discussed with Dr. Derrell LollingIngram -11/20/14--diagnostic laparoscopy did not reveal any conclusive findings -Continue Zosyn HCAP/Aspiration pneumonia -11/18/14 repeat CXR shows focal consolidation RLL  -continue Zosyn Fever -Patient has persistent fever--likely due to PNA and perforated viscus/peritonitis -Influenza PCR--neg -C. difficile PCR  negative -CT abdomen and pelvis--as discussed above -11/15/2014 blood cultures negative -11/15/2014 urinalysis is negative for pyuria Acute on chronic renal Failure (CKD stage 3) -Secondary to sepsis and volume depletion -Baseline creatinine 1.1-1.4 -Continue IV fluids Atrial fibrillation -Rate controlled -Continue metoprolol succinate-->change to IV as pt now npo -start ASA Hypertension  -increase metoprolol succinate to 50mg  daily -12/19--Restart amlodipine  -Dysphagia with hx of esophageal stricture -Barium esophagram--neg for stricture, shows esophageal dysmotility  Dementia  -Continue Aricept and Namenda  Chronic diastolic CHF  -Compensated  -Hold Lasix  -Judicious IV fluids-->saline lock Hypernatremia -change to hypotonic fluid Family Communication: Daughter updated on phone Disposition Plan: SNF when medically stable    Procedures/Studies: Ct Abdomen Pelvis Wo Contrast  11/19/2014   CLINICAL DATA:  Fever and chills. Leukocytosis with presumed aspiration pneumonia. History of coronary artery disease, dementia and renal insufficiency. Initial encounter.  EXAM: CT ABDOMEN AND PELVIS WITHOUT CONTRAST  TECHNIQUE: Multidetector CT imaging of the abdomen and pelvis was performed following the standard protocol without IV contrast.  COMPARISON:  Chest and abdominal radiographs 11/18/2014. Report from abdominal CT 01/29/2001.  FINDINGS: Lower chest: There are small to moderate dependent pleural effusions bilaterally with associated dependent bibasilar airspace opacities. These are most consistent with atelectasis. There is no consolidation or significant pericardial effusion. Extensive atherosclerosis of the coronary arteries and thoracic aorta noted. The heart is enlarged.  Hepatobiliary: The liver demonstrates decreased density consistent with steatosis. No focal lesions are identified on non contrast imaging. The gallbladder is surgically absent. There is air within  the extrahepatic and left intrahepatic biliary system.  Pancreas:  Unremarkable on noncontrast imaging.  Spleen: Normal in size without focal abnormality.  Adrenals/Urinary Tract: Both adrenal glands are somewhat indistinct without focal mass or hematoma.The left kidney demonstrates asymmetric cortical thinning and atrophy. There is some extraluminal gas around the left renal hilum,  but no definite air within its collecting system. There is no evidence of hydronephrosis or ureteral calculus. The bladder appears unremarkable.  Stomach/Bowel: Contrast from recent esophagram is present within the colon. There is no colonic wall thickening or extravasated enteric contrast. Diverticular changes are present throughout the colon. There is no definite bowel wall thickening or focal surrounding inflammation. However, multiple extraluminal air bubbles are present. These are most pronounced within the right upper quadrant, inferior to the cholecystectomy bed. Extraluminal air tracks inferiorly along the retroperitoneum. There are several extraluminal air bubbles within the pelvis.There is no free intraperitoneal air or drainable fluid collection.  Vascular/Lymphatic: There are no enlarged abdominal or pelvic lymph nodes. Aortoiliac atherosclerosis noted.  Reproductive: There are vascular calcifications within the uterus. No evidence of adnexal mass.  Other: There is generalized soft tissue edema affecting the subcutaneous and mesenteric fat. No abdominal wall hernia identified.  Musculoskeletal: No acute or significant osseous findings. There are degenerative changes throughout the spine with a grade 1 anterolisthesis at the lower 3 lumbar disc space levels.  IMPRESSION: 1. Multiple extraluminal air collections are present within the abdomen and pelvis, primarily within the retroperitoneum, consistent with a perforated viscus. Because most of the air is in the right upper quadrant, this could be due to a perforated duodenal  ulcer. Alternatively, findings could be secondary to perforated sigmoid diverticulitis. There is no free air or drainable fluid collection. 2. Pneumobilia, probably related to previous sphincterotomy or biliary bypass. 3. Bilateral pleural effusions with dependent opacities at both lung bases. No definite aspiration pneumonia demonstrated by this examination. 4. These results were called by telephone at the time of interpretation on 11/19/2014 at 7:50 pm to the patient's nurse, Tinnie GensJennie, who verbally acknowledged these results.   Electronically Signed   By: Roxy HorsemanBill  Veazey M.D.   On: 11/19/2014 19:53   Dg Chest 2 View  11/18/2014   CLINICAL DATA:  Shortness of breath  EXAM: CHEST  2 VIEW  COMPARISON:  Chest radiograph 11/15/2014  FINDINGS: Stable enlarged cardiac and mediastinal contours with tortuosity and calcification of thoracic aorta. Interval development of focal heterogeneous opacity within the right lung base. Thoracic spine degenerative change.  IMPRESSION: New focal consolidative opacity within the peripheral right lower lobe, concerning for infection in the appropriate clinical setting. Atelectasis would be an alternative consideration. Recommend short term radiographic followup to ensure resolution.   Electronically Signed   By: Annia Beltrew  Davis M.D.   On: 11/18/2014 12:11   Dg Chest 2 View  11/15/2014   CLINICAL DATA:  80 year old female with abnormal shaking, chills. Initial encounter.  EXAM: CHEST  2 VIEW  COMPARISON:  12/17/2011.  FINDINGS: Low lung volumes. Chronic cardiomegaly. Stable cardiac size and mediastinal contours. No pneumothorax or pulmonary edema. No pleural effusion or consolidation. Eventration of the diaphragm re - identified. No acute osseous abnormality identified. Stable cholecystectomy clips.  IMPRESSION: Low lung volumes, otherwise no acute cardiopulmonary abnormality.   Electronically Signed   By: Augusto GambleLee  Hall M.D.   On: 11/15/2014 16:35   Dg Esophagus  11/18/2014   CLINICAL  DATA:  PT C/O DYSPHAGIA, N/V W/MEALS, FAMILY REPORTS H/O ESOPHAGEAL STRICTURE WHICH REQUIRED DILATION "A LONG TIME AGO"  EXAM: ESOPHOGRAM/BARIUM SWALLOW  TECHNIQUE: Single contrast examination was performed using  thin barium.  FLUOROSCOPY TIME:  1 min and 34 seconds  COMPARISON:  None.  FINDINGS: Pharyngeal swallowing function is grossly normal with no aspiration visualized.  Esophagus is normal in course. There is a small sliding hiatal hernia.  There is no convincing stricture. No esophageal mass and no evidence of esophagitis. Study was somewhat limited by patient's probability to only take small swallows.  There is esophageal dysmotility with significant tertiary contractions and barium stasis. No reflux was documented during the exam.  IMPRESSION: 1. Esophageal dysmotility. 2. No convincing mass or stricture. 3. Small hiatal hernia.   Electronically Signed   By: Amie Portland M.D.   On: 11/18/2014 12:39   Dg Abd Portable 1v  11/18/2014   CLINICAL DATA:  Evaluate for clearance of barium from esophagram. Patient scheduled for CT.  EXAM: PORTABLE ABDOMEN - 1 VIEW  COMPARISON:  Earlier same day  FINDINGS: Residual barium contrast material is demonstrated throughout the colon to the level of the rectum. Relative paucity of small bowel gas. Right upper quadrant surgical clips. Lumbar spine degenerative change.  IMPRESSION: Residual barium is demonstrated throughout the colon to the level of the rectum.   Electronically Signed   By: Annia Belt M.D.   On: 11/18/2014 17:32         Subjective: Patient denies fevers, chills, headache, chest pain, dyspnea, nausea, vomiting, diarrhea, abdominal pain, dysuria, hematuria   Objective: Filed Vitals:   11/20/14 0629 11/20/14 0904 11/20/14 0924 11/20/14 1014  BP: 169/94   132/94  Pulse: 87   75  Temp: 100.1 F (37.8 C)  100.9 F (38.3 C)   TempSrc: Oral  Rectal   Resp: 20   20  Height:      Weight:      SpO2: 97% 98%  97%    Intake/Output Summary  (Last 24 hours) at 11/20/14 1647 Last data filed at 11/20/14 1625  Gross per 24 hour  Intake 3622.5 ml  Output    125 ml  Net 3497.5 ml   Weight change: 4.8 kg (10 lb 9.3 oz) Exam:   General:  Pt is alert, follows commands appropriately, not in acute distress  HEENT: No icterus, No thrush, No neck mass, [place]/AT  Cardiovascular: RRR, S1/S2, no rubs, no gallops  Respiratory: CTA bilaterally, no wheezing, no crackles, no rhonchi  Abdomen: Soft/+BS, epigastric and left upper quadrant pain without any rebound, non distended, no guarding  Extremities: No edema, No lymphangitis, No petechiae, No rashes, no synovitis  Data Reviewed: Basic Metabolic Panel:  Recent Labs Lab 11/15/14 1729 11/16/14 0343 11/17/14 0417 11/19/14 0425 11/20/14 0402  NA 141 144 144 147 146  K 4.5 3.2* 3.6* 3.3* 3.1*  CL 101 103 108 111 112  CO2 25 23 22  18* 20  GLUCOSE 111* 84 108* 111* 128*  BUN 38* 32* 28* 22 18  CREATININE 1.93* 1.79* 1.64* 1.48* 1.39*  CALCIUM 9.8 8.9 8.9 9.5 9.4  MG  --   --  2.3  --  2.0   Liver Function Tests:  Recent Labs Lab 11/15/14 1729  AST 14  ALT 6  ALKPHOS 90  BILITOT 0.2*  PROT 7.5  ALBUMIN 3.1*   No results for input(s): LIPASE, AMYLASE in the last 168 hours. No results for input(s): AMMONIA in the last 168 hours. CBC:  Recent Labs Lab 11/15/14 1729 11/16/14 0343 11/17/14 0417 11/19/14 0425 11/20/14 0402  WBC 25.4* 21.5* 27.0* 25.8* 22.6*  NEUTROABS 22.5*  --   --   --  19.9*  HGB 10.4* 9.0* 8.8* 9.0* 8.9*  HCT 33.9* 28.6* 27.4* 28.4* 27.6*  MCV 99.7 98.3 96.8 96.6 96.5  PLT 256 240 222 267 278   Cardiac Enzymes: No results for input(s): CKTOTAL,  CKMB, CKMBINDEX, TROPONINI in the last 168 hours. BNP: Invalid input(s): POCBNP CBG: No results for input(s): GLUCAP in the last 168 hours.  Recent Results (from the past 240 hour(s))  Culture, blood (routine x 2)     Status: None (Preliminary result)   Collection Time: 11/15/14  6:59 PM    Result Value Ref Range Status   Specimen Description BLOOD RIGHT HAND  Final   Special Requests BOTTLES DRAWN AEROBIC ONLY  Final   Culture  Setup Time   Final    11/16/2014 05:47 Performed at [facility]    Culture   Final           BLOOD CULTURE RECEIVED NO GROWTH TO DATE CULTURE WILL BE HELD FOR 5 DAYS BEFORE ISSUING A FINAL NEGATIVE REPORT Performed at [facility]    Report Status PENDING  Incomplete  Culture, blood (routine x 2)     Status: None (Preliminary result)   Collection Time: 11/15/14  7:07 PM  Result Value Ref Range Status   Specimen Description BLOOD RIGHT FOREARM  Final   Special Requests BOTTLES DRAWN AEROBIC ONLY  Final   Culture  Setup Time   Final    11/16/2014 05:47 Performed at [facility]    Culture   Final           BLOOD CULTURE RECEIVED NO GROWTH TO DATE CULTURE WILL BE HELD FOR 5 DAYS BEFORE ISSUING A FINAL NEGATIVE REPORT Performed at [facility]    Report Status PENDING  Incomplete  MRSA PCR Screening     Status: Abnormal   Collection Time: 11/15/14 10:34 PM  Result Value Ref Range Status   MRSA by PCR POSITIVE (A) NEGATIVE Final    Comment:        The GeneXpert MRSA Assay (FDA approved for NASAL specimens only), is one component of a comprehensive MRSA colonization surveillance program. It is not intended to diagnose MRSA infection nor to guide or monitor treatment for MRSA infections. RESULT CALLED TO, READ BACK BY AND VERIFIED WITH: L.HUDSON,RN AT 0219 ON 11/16/14 BY [email or web address]   Clostridium Difficile by PCR     Status: None   Collection Time: 11/17/14 12:18 AM  Result Value Ref Range Status   C difficile by pcr NEGATIVE NEGATIVE Final    Comment: Performed at [facility]  Clostridium Difficile by PCR     Status: None   Collection Time: 11/19/14  6:59 PM  Result Value Ref Range Status   C difficile by pcr NEGATIVE NEGATIVE Final    Comment: Performed at [facility]  Surgical  pcr screen     Status: Abnormal   Collection Time: 11/20/14  1:32 PM  Result Value Ref Range Status   MRSA, PCR POSITIVE (A) NEGATIVE Final    Comment: RESULT CALLED TO, READ BACK BY AND VERIFIED WITH: Reymundo Poll RN 1554 11/20/14 [place]    Staphylococcus aureus POSITIVE (A) NEGATIVE Final    Comment:        The Xpert SA Assay (FDA approved for NASAL specimens in patients over 15 years of age), is one component of a comprehensive surveillance program.  Test performance has been validated by [employer] for patients greater than or equal to 65 year old. It is not intended to diagnose infection nor to guide or monitor treatment.      Scheduled Meds: . [facility] Hold] antiseptic oral rinse  7 mL Mouth Rinse q12n4p  . [facility]  Hold] budesonide (PULMICORT) nebulizer solution  0.25 mg Nebulization BID  . [MAR Hold] chlorhexidine  15 mL Mouth Rinse BID  . [MAR Hold] enoxaparin (LOVENOX) injection  30 mg Subcutaneous Q24H  . [MAR Hold] latanoprost  1 drop Both Eyes QHS  . [MAR Hold] metoprolol  2.5 mg Intravenous 4 times per day  . [MAR Hold] mupirocin ointment  1 application Nasal BID  . [MAR Hold] piperacillin-tazobactam (ZOSYN)  IV  3.375 g Intravenous Q8H   Continuous Infusions: . dextrose 5 % and 0.45% NaCl 1,000 mL with potassium chloride 20 mEq infusion 75 mL/hr (11/20/14 1432)     Fredrica Capano, DO  [facility] Pager [phone]  If 7PM-7AM, please contact night-coverage [email or web address] Password [facility] 11/20/2014, 4:47 PM   LOS: 5 days

## 2014-11-20 NOTE — Progress Notes (Signed)
Bilateral soft non violent wrist restraints placed on pt due to pt trying to pull out NG tube.  Will continue to monitor closely.

## 2014-11-20 NOTE — Progress Notes (Signed)
OT Cancellation Note  Patient Details Name: Kristi [name] MRN: [id number] DOB: Dec 30, 1930   Cancelled Treatment:    Reason Eval/Treat Not Completed: Medical issues which prohibited therapy Pt not feeling well- awaiting MD to speak with family regarding surgical options.Will reattempt later in day or next day as pt able  Tage Feggins, [place] KungLorraine D 11/20/2014, 10:53 AM

## 2014-11-20 NOTE — Op Note (Addendum)
Patient Name:           Kristi [name]   Date of Surgery:        11/20/2014  Pre op Diagnosis:      Pneumoperitoneum of uncertain etiology  Post op Diagnosis:    Pneumoperitoneum of uncertain etiology. Negative laparoscopy  Procedure:                 Diagnostic laparoscopy of abdomen and pelvis, placement of drains  Surgeon:                     Angelia MouldHaywood M. Derrell LollingIngram, M.D., FACS  Assistant:                      OR staff  Operative Indications:   This is an 80 year old female with a history of dementia, coronary artery disease, atrial fibrillation, congestive heart failure, and hypertension. She lives in a skilled nursing facility. She was admitted on 11/15/2014 by the medical service. She was having chills and fever. She is a difficult historian. It was thought that she had pneumonia and she was admitted and treated. She continues to have fevers and a CT was ordered. This showed some extraluminal air in the right upper quadrant possibly retroperitoneum and some down in the pelvis. There was no abscess or obstruction or bowel wall thickening. . She had had an upper GI the day before which showed no extravasation and the contrasted moved on into the distal colon. There is no inflammatory change of the stomach duodenum small intestine or large intestine. Surgical evaluation yesterday revealed a completely benign abdomen. Today, I thought her abdomen was a little bit tender but again difficult historian and difficult, unreliable exam. It was not clear to me whether this was a benign pneumoperitoneum or a walled off perforation. I discussed numerous options with the patient's daughters. They wanted me to do everything and so I told him I would perform diagnostic laparoscopy and possible laparotomy.  Operative Findings:       I did not find any bowel ischemia. The small bowel and colon looked pink and viable and had good peristalsis. I did not see any purulent fluid. There was a little bit of clear ascites  in the pelvis. The sigmoid colon looked healthy and was not inflamed and there is no exudate. The small bowel looked healthy and there was no ischemia or inflammation. I spent a good deal of time checking the appendix and right colon which looked normal. There are some chronic adhesions in the right sub-phrenic area but there was no edema or retroperitoneal air. There is no exudate in the right upper quadrant or left upper quadrant. The anterior surface of the stomach and antrum looked normal. It was my judgment that she could possibly have a walled off perforation but without obstruction or abscess or ischemia I did not feel compelled to perform a laparotomy on this elderly debilitated patient. I chose to place drains in the right upper quadrant and left paracolic gutter and concluded the operation.  Procedure in Detail:          Following the induction of general endotracheal anesthesia the patient's abdomen was prepped and draped in sterile fashion. Surgical timeout was performed. 0.5% Marcaine with epinephrine was used as local infiltration anesthetic. I placed an 11 mm Hassan trocar in the midline just above the umbilicus with an open technique. Pneumoperitoneum was created and video camera was inserted. Placed a 5 mm  trocar in the right midabdomen 5 mm trocar in the left upper quadrant and 5 mm trocar in the right lower quadrant. I systematically explored the subphrenic spaces subhepatic spaces. I looked on top of and under the transverse colon. I examined the right colon and descending colon and sigmoid colon. I examined the small bowel and appendix. The retroperitoneum at the ligament Treitz looked clean. The pelvis looked clean. I placed a 7819 [place] Blake drain from the right upper quadrant trocar site across under the liver and across the midline on top of the stomach. I placed a 1419 [place] Blake drain from the left upper quadrant trocar down the left paracolic gutter toward the pelvis. These drains were  sutured in place and connected to suction bulbs. There was no bleeding. Trochars were removed and the pneumoperitoneum was released. The fascia of the umbilicus was closed with 0 Vicryl sutures. Skin incisions were closed with skin staples. Patient tolerated the procedure well was taken to PACU in stable condition. EBL 20 mL. Counts correct. Complications none.     Angelia MouldHaywood M. Derrell LollingIngram, M.D., FACS General and Minimally Invasive Surgery Breast and Colorectal Surgery  11/20/2014 4:31 PM

## 2014-11-20 NOTE — Progress Notes (Signed)
Subjective: She is in bed alert, but not talking.  When i ask her where she is she just answers with nods, and groans.  She does not know, where she is, she doesn't know why she is here, and she does not seem to have any complaints.   On exam she is febrile, with Oral and axillary temp in the 99.2 range.  She has some loose stool and cannot really answer for herself.    Objective: Vital signs in last 24 hours: Temp:  [98 F (36.7 C)-100.4 F (38 C)] 100.1 F (37.8 C) (12/21 0629) Pulse Rate:  [67-87] 87 (12/21 0629) Resp:  [20-22] 20 (12/21 0629) BP: (158-179)/(67-94) 169/94 mmHg (12/21 0629) SpO2:  [93 %-97 %] 97 % (12/21 0629) Weight:  [58.5 kg (128 lb 15.5 oz)] 58.5 kg (128 lb 15.5 oz) (12/21 0612) Last BM Date: 11/19/14 5 stools recorded yesterday, 60 PO recorded Low grade fever, 99-100 range yesterday and this AM 100.1. BP up some also K+3.1, Creatinine is stable   WBC better but still up in the 22.6K  C diff negative x 2 (12/18, and 11/19/14) Ua negative on admit Intake/Output from previous day: 12/20 0701 - 12/21 0700 In: 1976.3 [P.O.:60; I.V.:1816.3; IV Piggyback:100] Out: -  Intake/Output this shift:    General appearance: She is awake, but she cannot really answer any questions.  she seems very febrile to me, but no distresss. Resp: Clear upper lobes, rales and decreased BS in lower lobes. GI: soft, non-tender; bowel sounds normal; no masses,  no organomegaly  She has some loose stool.  Lab Results:   Recent Labs  11/19/14 0425 11/20/14 0402  WBC 25.8* 22.6*  HGB 9.0* 8.9*  HCT 28.4* 27.6*  PLT 267 278    BMET  Recent Labs  11/19/14 0425 11/20/14 0402  NA 147 146  K 3.3* 3.1*  CL 111 112  CO2 18* 20  GLUCOSE 111* 128*  BUN 22 18  CREATININE 1.48* 1.39*  CALCIUM 9.5 9.4   PT/INR No results for input(s): LABPROT, INR in the last 72 hours.   Recent Labs Lab 11/15/14 1729  AST 14  ALT 6  ALKPHOS 90  BILITOT 0.2*  PROT 7.5  ALBUMIN  3.1*     Lipase  No results found for: LIPASE   Studies/Results: Ct Abdomen Pelvis Wo Contrast  11/19/2014   CLINICAL DATA:  Fever and chills. Leukocytosis with presumed aspiration pneumonia. History of coronary artery disease, dementia and renal insufficiency. Initial encounter.  EXAM: CT ABDOMEN AND PELVIS WITHOUT CONTRAST  TECHNIQUE: Multidetector CT imaging of the abdomen and pelvis was performed following the standard protocol without IV contrast.  COMPARISON:  Chest and abdominal radiographs 11/18/2014. Report from abdominal CT 01/29/2001.  FINDINGS: Lower chest: There are small to moderate dependent pleural effusions bilaterally with associated dependent bibasilar airspace opacities. These are most consistent with atelectasis. There is no consolidation or significant pericardial effusion. Extensive atherosclerosis of the coronary arteries and thoracic aorta noted. The heart is enlarged.  Hepatobiliary: The liver demonstrates decreased density consistent with steatosis. No focal lesions are identified on non contrast imaging. The gallbladder is surgically absent. There is air within the extrahepatic and left intrahepatic biliary system.  Pancreas:  Unremarkable on noncontrast imaging.  Spleen: Normal in size without focal abnormality.  Adrenals/Urinary Tract: Both adrenal glands are somewhat indistinct without focal mass or hematoma.The left kidney demonstrates asymmetric cortical thinning and atrophy. There is some extraluminal gas around the left renal hilum, but no definite  air within its collecting system. There is no evidence of hydronephrosis or ureteral calculus. The bladder appears unremarkable.  Stomach/Bowel: Contrast from recent esophagram is present within the colon. There is no colonic wall thickening or extravasated enteric contrast. Diverticular changes are present throughout the colon. There is no definite bowel wall thickening or focal surrounding inflammation. However, multiple  extraluminal air bubbles are present. These are most pronounced within the right upper quadrant, inferior to the cholecystectomy bed. Extraluminal air tracks inferiorly along the retroperitoneum. There are several extraluminal air bubbles within the pelvis.There is no free intraperitoneal air or drainable fluid collection.  Vascular/Lymphatic: There are no enlarged abdominal or pelvic lymph nodes. Aortoiliac atherosclerosis noted.  Reproductive: There are vascular calcifications within the uterus. No evidence of adnexal mass.  Other: There is generalized soft tissue edema affecting the subcutaneous and mesenteric fat. No abdominal wall hernia identified.  Musculoskeletal: No acute or significant osseous findings. There are degenerative changes throughout the spine with a grade 1 anterolisthesis at the lower 3 lumbar disc space levels.  IMPRESSION: 1. Multiple extraluminal air collections are present within the abdomen and pelvis, primarily within the retroperitoneum, consistent with a perforated viscus. Because most of the air is in the right upper quadrant, this could be due to a perforated duodenal ulcer. Alternatively, findings could be secondary to perforated sigmoid diverticulitis. There is no free air or drainable fluid collection. 2. Pneumobilia, probably related to previous sphincterotomy or biliary bypass. 3. Bilateral pleural effusions with dependent opacities at both lung bases. No definite aspiration pneumonia demonstrated by this examination. 4. These results were called by telephone at the time of interpretation on 11/19/2014 at 7:50 pm to the patient's nurse, Tinnie GensJennie, who verbally acknowledged these results.   Electronically Signed   By: Roxy HorsemanBill  Veazey M.D.   On: 11/19/2014 19:53   Dg Chest 2 View  11/18/2014   CLINICAL DATA:  Shortness of breath  EXAM: CHEST  2 VIEW  COMPARISON:  Chest radiograph 11/15/2014  FINDINGS: Stable enlarged cardiac and mediastinal contours with tortuosity and calcification  of thoracic aorta. Interval development of focal heterogeneous opacity within the right lung base. Thoracic spine degenerative change.  IMPRESSION: New focal consolidative opacity within the peripheral right lower lobe, concerning for infection in the appropriate clinical setting. Atelectasis would be an alternative consideration. Recommend short term radiographic followup to ensure resolution.   Electronically Signed   By: Annia Beltrew  Davis M.D.   On: 11/18/2014 12:11   Dg Esophagus  11/18/2014   CLINICAL DATA:  PT C/O DYSPHAGIA, N/V W/MEALS, FAMILY REPORTS H/O ESOPHAGEAL STRICTURE WHICH REQUIRED DILATION "A LONG TIME AGO"  EXAM: ESOPHOGRAM/BARIUM SWALLOW  TECHNIQUE: Single contrast examination was performed using  thin barium.  FLUOROSCOPY TIME:  1 min and 34 seconds  COMPARISON:  None.  FINDINGS: Pharyngeal swallowing function is grossly normal with no aspiration visualized.  Esophagus is normal in course. There is a small sliding hiatal hernia. There is no convincing stricture. No esophageal mass and no evidence of esophagitis. Study was somewhat limited by patient's probability to only take small swallows.  There is esophageal dysmotility with significant tertiary contractions and barium stasis. No reflux was documented during the exam.  IMPRESSION: 1. Esophageal dysmotility. 2. No convincing mass or stricture. 3. Small hiatal hernia.   Electronically Signed   By: Amie Portlandavid  Ormond M.D.   On: 11/18/2014 12:39   Dg Abd Portable 1v  11/18/2014   CLINICAL DATA:  Evaluate for clearance of barium from esophagram. Patient scheduled for  CT.  EXAM: PORTABLE ABDOMEN - 1 VIEW  COMPARISON:  Earlier same day  FINDINGS: Residual barium contrast material is demonstrated throughout the colon to the level of the rectum. Relative paucity of small bowel gas. Right upper quadrant surgical clips. Lumbar spine degenerative change.  IMPRESSION: Residual barium is demonstrated throughout the colon to the level of the rectum.    Electronically Signed   By: Annia Beltrew  Davis M.D.   On: 11/18/2014 17:32    Medications: . antiseptic oral rinse  7 mL Mouth Rinse q12n4p  . budesonide (PULMICORT) nebulizer solution  0.25 mg Nebulization BID  . chlorhexidine  15 mL Mouth Rinse BID  . enoxaparin (LOVENOX) injection  30 mg Subcutaneous Q24H  . latanoprost  1 drop Both Eyes QHS  . metoprolol  2.5 mg Intravenous 4 times per day  . mupirocin ointment  1 application Nasal BID  . piperacillin-tazobactam (ZOSYN)  IV  3.375 g Intravenous Q8H  . potassium chloride  10 mEq Intravenous Q1 Hr x 4    Assessment/Plan Pneumoperitoneum primarily within the retroperitoneum, pneumobilia of uncertain etiology Community acquired Pneumonia/fever Bilateral pleural effusions Hx of AF/CHF/EF 40-45%/AI Pulmonary hypertension Hypertension Chronic renal disease Stage IV Dementia/Noncompliance/currently resident on SNF  DNR   Plan:  Dr. Derrell LollingIngram has seen her and is calling the family to discuss exploratory laparoscopy, and possible laparotomy for free air, and sepsis.   LOS: 5 days    Luellen Howson 11/20/2014

## 2014-11-20 NOTE — Progress Notes (Signed)
I was able to make phone contact with the patient's daughter, Ricardo JerichoShirley Sutton, phone number [phone].   I told her that I was concerned that there was a perforated viscus, although I did not know what part of the intestinal tract was perforated.   I told her that I did not think the pneumonia could cause all of this and that we should take a look with laparoscopy and possible laparotomy. I told her that this might require intestinal resection and colostomy. I told her that she may or may not survive the surgery. She is going to call her sister and they're going to come in now to discuss this further so that we can proceed with informed consent and a decision.   Angelia MouldHaywood M. Derrell LollingIngram, M.D., [facility] General and Minimally invasive Surgery Breast and Colorectal Surgery Office:   [phone] Pager:   [phone]

## 2014-11-20 NOTE — Anesthesia Procedure Notes (Signed)
Procedure Name: Intubation Date/Time: 11/20/2014 2:47 PM Performed by: Jarvis NewcomerARMISTEAD, Lottie Sigman A Pre-anesthesia Checklist: Patient identified, Emergency Drugs available, Suction available, Patient being monitored and Timeout performed Patient Re-evaluated:Patient Re-evaluated prior to inductionOxygen Delivery Method: Circle system utilized Preoxygenation: Pre-oxygenation with 100% oxygen Intubation Type: IV induction Ventilation: Mask ventilation without difficulty Laryngoscope Size: Mac and 4 Grade View: Grade I Number of attempts: 1 Airway Equipment and Method: Stylet Placement Confirmation: ETT inserted through vocal cords under direct vision,  positive ETCO2 and breath sounds checked- equal and bilateral Secured at: 21 cm Tube secured with: Tape Dental Injury: Teeth and Oropharynx as per pre-operative assessment

## 2014-11-20 NOTE — Progress Notes (Signed)
Gen. surgery:  I have a lengthy discussion with the patient's 2 daughters and her granddaughter. I have told him that I am uncertain but have discussed the differential diagnosis which includes but is not limited to perforated ulcer, perforated diverticulitis, mid gut infarction. They're aware that I may not find a specific problem. They know that I might find an uncorrectable problem, which might lead to comfort care only postop. \I have told him that while she might survive with antibiotics alone, it seems less likely now. I have told them that she may not survive the operation or her hospital stay. I have told them that she will probably be on the ventilator postoperatively  I have told him that her DO NOT RESUSCITATE order is suspended for  the duration of the operation and they are perfectly comfortable with that.  They all would like for me to go ahead with surgery to see if it is something that can be corrected so that she can be returned to her living condition. Neck line they are aware that she may deteriorate and be bedridden for the rest of her life. They're aware that this will not improve her mental status at all and probably will acutely worsened. They still wish that I go ahead with surgery.  We will proceed with this very high risk surgery today.  Angelia MouldHaywood M. Derrell LollingIngram, M.D., [facility] General and Minimally invasive Surgery Breast and Colorectal Surgery Office:   [phone] Pager:   [phone]

## 2014-11-20 NOTE — Anesthesia Preprocedure Evaluation (Addendum)
Anesthesia Evaluation  Patient identified by MRN, date of birth, ID band Patient awake    Reviewed: Allergy & Precautions, H&P , NPO status , Patient's Chart, lab work & pertinent test results  History of Anesthesia Complications (+) AWARENESS UNDER ANESTHESIA  Airway Mallampati: II  TM Distance: >3 FB Neck ROM: Full    Dental no notable dental hx.    Pulmonary pneumonia -, unresolved, former smoker,  CXR: 12-19: RLL opacity. breath sounds clear to auscultation  Pulmonary exam normal       Cardiovascular hypertension, + CAD and +CHF + dysrhythmias Atrial Fibrillation Rhythm:Regular Rate:Normal  H/O pulmonary hypertension.  Reviewed last cardiology office note of 2012, Dr. Myrtis SerKatz. EF 40-45%, aortic insufficiency, pulmonary htn.   Neuro/Psych PSYCHIATRIC DISORDERS H/O Dementia.    GI/Hepatic negative GI ROS, Neg liver ROS,   Endo/Other  negative endocrine ROS  Renal/GU Renal disease  negative genitourinary   Musculoskeletal  (+) Arthritis -,   Abdominal   Peds negative pediatric ROS (+)  Hematology  (+) anemia ,   Anesthesia Other Findings   Reproductive/Obstetrics negative OB ROS                         Anesthesia Physical Anesthesia Plan  ASA: IV and emergent  Anesthesia Plan: General   Post-op Pain Management:    Induction: Intravenous  Airway Management Planned: Oral ETT  Additional Equipment:   Intra-op Plan:   Post-operative Plan: Possible Post-op intubation/ventilation  Informed Consent: I have reviewed the patients History and Physical, chart, labs and discussed the procedure including the risks, benefits and alternatives for the proposed anesthesia with the patient or authorized representative who has indicated his/her understanding and acceptance.   Dental advisory given  Plan Discussed with: CRNA  Anesthesia Plan Comments: (Discussed possible postoperative  ventilation and possible ICU placement and risk for complications with patient's daughters. They understand.)       Anesthesia Quick Evaluation

## 2014-11-20 NOTE — Progress Notes (Signed)
Surgery in to see patient, update given. Per Dr. Maisie Fushomas patient is to remain on bowel rest at this time and surgery will see her tomorrow.

## 2014-11-20 NOTE — Progress Notes (Signed)
Call from Radiologist regarding patient abdominal CT results. Reports patient with air noted outside of bowel, no gross free air or free fluid noted. States final report will be available soon. Kristi [name] Callahan NP notified of abnormal CT results, awaiting call back.

## 2014-11-21 ENCOUNTER — Encounter (HOSPITAL_COMMUNITY): Payer: Self-pay | Admitting: General Surgery

## 2014-11-21 LAB — CBC
HCT: 30.5 % — ABNORMAL LOW (ref 36.0–46.0)
Hemoglobin: 9.6 g/dL — ABNORMAL LOW (ref 12.0–15.0)
MCH: 30.7 pg (ref 26.0–34.0)
MCHC: 31.5 g/dL (ref 30.0–36.0)
MCV: 97.4 fL (ref 78.0–100.0)
PLATELETS: 275 10*3/uL (ref 150–400)
RBC: 3.13 MIL/uL — AB (ref 3.87–5.11)
RDW: 13.7 % (ref 11.5–15.5)
WBC: 26.1 10*3/uL — ABNORMAL HIGH (ref 4.0–10.5)

## 2014-11-21 LAB — BASIC METABOLIC PANEL
Anion gap: 9 (ref 5–15)
BUN: 15 mg/dL (ref 6–23)
CO2: 23 mmol/L (ref 19–32)
CREATININE: 1.36 mg/dL — AB (ref 0.50–1.10)
Calcium: 9 mg/dL (ref 8.4–10.5)
Chloride: 113 mEq/L — ABNORMAL HIGH (ref 96–112)
GFR calc Af Amer: 41 mL/min — ABNORMAL LOW (ref >90)
GFR, EST NON AFRICAN AMERICAN: 35 mL/min — AB (ref >90)
GLUCOSE: 93 mg/dL (ref 70–99)
Potassium: 3.7 mmol/L (ref 3.5–5.1)
Sodium: 145 mmol/L (ref 135–145)

## 2014-11-21 MED ORDER — METOPROLOL TARTRATE 1 MG/ML IV SOLN
2.5000 mg | Freq: Once | INTRAVENOUS | Status: AC
  Administered 2014-11-21: 2.5 mg via INTRAVENOUS
  Filled 2014-11-21: qty 5

## 2014-11-21 MED ORDER — HYDRALAZINE HCL 20 MG/ML IJ SOLN
10.0000 mg | Freq: Four times a day (QID) | INTRAMUSCULAR | Status: DC
  Administered 2014-11-21 – 2014-11-22 (×6): 10 mg via INTRAVENOUS
  Filled 2014-11-21 (×3): qty 0.5
  Filled 2014-11-21 (×6): qty 1
  Filled 2014-11-21: qty 0.5

## 2014-11-21 MED ORDER — HYDRALAZINE HCL 20 MG/ML IJ SOLN
10.0000 mg | Freq: Once | INTRAMUSCULAR | Status: AC
  Administered 2014-11-21: 10 mg via INTRAVENOUS
  Filled 2014-11-21: qty 1

## 2014-11-21 MED ORDER — METOPROLOL TARTRATE 1 MG/ML IV SOLN
5.0000 mg | Freq: Four times a day (QID) | INTRAVENOUS | Status: DC
  Administered 2014-11-21 – 2014-11-27 (×22): 5 mg via INTRAVENOUS
  Filled 2014-11-21 (×28): qty 5

## 2014-11-21 NOTE — Progress Notes (Signed)
Manual BP 190/98. Pt asymptomatic. HR in 70's. No c/o headache or blurred vision. NP on call paged. New order placed. Will continue to monitor closely.

## 2014-11-21 NOTE — Progress Notes (Signed)
PROGRESS NOTE  Kristi [name] ZOX:[id number] DOB: [birth year] DOA: 11/15/2014 PCP: No primary care provider on file. Brief history 80 year old female with a history ofCAD, atrial fibrillation, dementia, hypertension, dyslipidemia, CKD was sent to the emergency department from her nursing facility when she was observed by staff to be complianing of shivering and chills. The patient was found have a temperature of 101.70F in the emergency department. At baseline, the patient is able to walk without an assistive device and feed herself. The patient was noted to have leukocytosis, fever. She was treated for presumptive aspiration pneumonia/HCAP. She was started on vancomycin and Zosyn. Gastroenterology was consulted. They ordered a barium swallow which did not reveal a stricture. He did not feel that any further intervention was necessary. The patient continued to have fever and leukocytosis. As result, CT of the abdomen and pelvis was ordered which showed Multiple extraluminal air collections are present within the abdomen and pelvis, primarily within the retroperitoneum, consistent with a perforated viscus. As a result, general surgery was consulted. On 11/20/2014, Dr. Derrell Lolling took pt for diagnostic laparoscopy. Assessment/Plan: Sepsis -Present at the time of admission -Secondary to aspiration pneumonia/HCAP and peritonitis -Repeat chest x-ray 11/18/2014 shows right lower lobe focal consolidation -Check influenza PCR--neg -IV fluids Pneumoperitoneum -concerned about perforated viscus with peritonitis -appreciate Dr. Derrell Lolling -11/20/14--case discussed with Dr. Derrell Lolling -11/20/14--diagnostic laparoscopy did not reveal any conclusive findings of perforation -appreciate surgery followup-->repeat CT abdomen and pelvis in few days -Continue Zosyn, remain npo HCAP/Aspiration pneumonia -11/18/14 repeat CXR shows focal consolidation RLL  -continue Zosyn D#6 Fever -Patient has persistent  fever--likely due to PNA and perforated viscus/peritonitis -Influenza PCR--neg -C. difficile PCR negative -CT abdomen and pelvis--as discussed above -11/15/2014 blood cultures negative -11/15/2014 urinalysis is negative for pyuria Acute on chronic renal Failure (CKD stage 3) -Secondary to sepsis and volume depletion -Baseline creatinine 1.1-1.4 -Continue IV fluids Atrial fibrillation -Rate controlled -Continue metoprolol succinate-->change to IV as pt now npo -start ASA Hypertension  -change to IV anti-HTN meds since pt is npo -add IV hydralazine 11/21/14 -increase  IV Lopressor 5 mg IV every 6 hours -Dysphagia with hx of esophageal stricture -Barium esophagram--neg for stricture, shows esophageal dysmotility  Dementia  -Continue Aricept and Namenda  Chronic diastolic CHF  -Compensated  -Hold Lasix  Hypernatremia -change to hypotonic fluid Family Communication: Daughter updated on phone Disposition Plan: SNF when medically stable   Procedures/Studies: Ct Abdomen Pelvis Wo Contrast  11/19/2014   CLINICAL DATA:  Fever and chills. Leukocytosis with presumed aspiration pneumonia. History of coronary artery disease, dementia and renal insufficiency. Initial encounter.  EXAM: CT ABDOMEN AND PELVIS WITHOUT CONTRAST  TECHNIQUE: Multidetector CT imaging of the abdomen and pelvis was performed following the standard protocol without IV contrast.  COMPARISON:  Chest and abdominal radiographs 11/18/2014. Report from abdominal CT 01/29/2001.  FINDINGS: Lower chest: There are small to moderate dependent pleural effusions bilaterally with associated dependent bibasilar airspace opacities. These are most consistent with atelectasis. There is no consolidation or significant pericardial effusion. Extensive atherosclerosis of the coronary arteries and thoracic aorta noted. The heart is enlarged.  Hepatobiliary: The liver demonstrates decreased density consistent with steatosis. No focal  lesions are identified on non contrast imaging. The gallbladder is surgically absent. There is air within the extrahepatic and left intrahepatic biliary system.  Pancreas:  Unremarkable on noncontrast imaging.  Spleen: Normal in size without focal abnormality.  Adrenals/Urinary Tract: Both adrenal glands are somewhat indistinct without focal mass  or hematoma.The left kidney demonstrates asymmetric cortical thinning and atrophy. There is some extraluminal gas around the left renal hilum, but no definite air within its collecting system. There is no evidence of hydronephrosis or ureteral calculus. The bladder appears unremarkable.  Stomach/Bowel: Contrast from recent esophagram is present within the colon. There is no colonic wall thickening or extravasated enteric contrast. Diverticular changes are present throughout the colon. There is no definite bowel wall thickening or focal surrounding inflammation. However, multiple extraluminal air bubbles are present. These are most pronounced within the right upper quadrant, inferior to the cholecystectomy bed. Extraluminal air tracks inferiorly along the retroperitoneum. There are several extraluminal air bubbles within the pelvis.There is no free intraperitoneal air or drainable fluid collection.  Vascular/Lymphatic: There are no enlarged abdominal or pelvic lymph nodes. Aortoiliac atherosclerosis noted.  Reproductive: There are vascular calcifications within the uterus. No evidence of adnexal mass.  Other: There is generalized soft tissue edema affecting the subcutaneous and mesenteric fat. No abdominal wall hernia identified.  Musculoskeletal: No acute or significant osseous findings. There are degenerative changes throughout the spine with a grade 1 anterolisthesis at the lower 3 lumbar disc space levels.  IMPRESSION: 1. Multiple extraluminal air collections are present within the abdomen and pelvis, primarily within the retroperitoneum, consistent with a perforated  viscus. Because most of the air is in the right upper quadrant, this could be due to a perforated duodenal ulcer. Alternatively, findings could be secondary to perforated sigmoid diverticulitis. There is no free air or drainable fluid collection. 2. Pneumobilia, probably related to previous sphincterotomy or biliary bypass. 3. Bilateral pleural effusions with dependent opacities at both lung bases. No definite aspiration pneumonia demonstrated by this examination. 4. These results were called by telephone at the time of interpretation on 11/19/2014 at 7:50 pm to the patient's nurse, Tinnie Gens, who verbally acknowledged these results.   Electronically Signed   By: Roxy Horseman M.D.   On: 11/19/2014 19:53   Dg Chest 2 View  11/18/2014   CLINICAL DATA:  Shortness of breath  EXAM: CHEST  2 VIEW  COMPARISON:  Chest radiograph 11/15/2014  FINDINGS: Stable enlarged cardiac and mediastinal contours with tortuosity and calcification of thoracic aorta. Interval development of focal heterogeneous opacity within the right lung base. Thoracic spine degenerative change.  IMPRESSION: New focal consolidative opacity within the peripheral right lower lobe, concerning for infection in the appropriate clinical setting. Atelectasis would be an alternative consideration. Recommend short term radiographic followup to ensure resolution.   Electronically Signed   By: Annia Belt M.D.   On: 11/18/2014 12:11   Dg Chest 2 View  11/15/2014   CLINICAL DATA:  80 year old female with abnormal shaking, chills. Initial encounter.  EXAM: CHEST  2 VIEW  COMPARISON:  12/17/2011.  FINDINGS: Low lung volumes. Chronic cardiomegaly. Stable cardiac size and mediastinal contours. No pneumothorax or pulmonary edema. No pleural effusion or consolidation. Eventration of the diaphragm re - identified. No acute osseous abnormality identified. Stable cholecystectomy clips.  IMPRESSION: Low lung volumes, otherwise no acute cardiopulmonary abnormality.    Electronically Signed   By: Augusto Gamble M.D.   On: 11/15/2014 16:35   Dg Esophagus  11/18/2014   CLINICAL DATA:  PT C/O DYSPHAGIA, N/V W/MEALS, FAMILY REPORTS H/O ESOPHAGEAL STRICTURE WHICH REQUIRED DILATION "A LONG TIME AGO"  EXAM: ESOPHOGRAM/BARIUM SWALLOW  TECHNIQUE: Single contrast examination was performed using  thin barium.  FLUOROSCOPY TIME:  1 min and 34 seconds  COMPARISON:  None.  FINDINGS: Pharyngeal swallowing function  is grossly normal with no aspiration visualized.  Esophagus is normal in course. There is a small sliding hiatal hernia. There is no convincing stricture. No esophageal mass and no evidence of esophagitis. Study was somewhat limited by patient's probability to only take small swallows.  There is esophageal dysmotility with significant tertiary contractions and barium stasis. No reflux was documented during the exam.  IMPRESSION: 1. Esophageal dysmotility. 2. No convincing mass or stricture. 3. Small hiatal hernia.   Electronically Signed   By: Amie Portlandavid  Ormond M.D.   On: 11/18/2014 12:39   Dg Abd Portable 1v  11/18/2014   CLINICAL DATA:  Evaluate for clearance of barium from esophagram. Patient scheduled for CT.  EXAM: PORTABLE ABDOMEN - 1 VIEW  COMPARISON:  Earlier same day  FINDINGS: Residual barium contrast material is demonstrated throughout the colon to the level of the rectum. Relative paucity of small bowel gas. Right upper quadrant surgical clips. Lumbar spine degenerative change.  IMPRESSION: Residual barium is demonstrated throughout the colon to the level of the rectum.   Electronically Signed   By: Annia Beltrew  Davis M.D.   On: 11/18/2014 17:32         Subjective:   Objective: Filed Vitals:   11/21/14 0832 11/21/14 1025 11/21/14 1346 11/21/14 1356  BP:  185/95 176/78 170/88  Pulse:  74 72   Temp:   98.6 F (37 C)   TempSrc:   Oral   Resp:  20 20   Height:      Weight:      SpO2: 8% 96% 100%     Intake/Output Summary (Last 24 hours) at 11/21/14 1741 Last  data filed at 11/21/14 1632  Gross per 24 hour  Intake 903.34 ml  Output   1665 ml  Net -761.66 ml   Weight change: -5.4 kg (-11 lb 14.5 oz) Exam:   General:  Pt is alert, follows commands appropriately, not in acute distress  HEENT: No icterus, No thrush, No neck mass, [place]/AT  Cardiovascular: RRR, S1/S2, no rubs, no gallops  Respiratory: CTA bilaterally, no wheezing, no crackles, no rhonchi  Abdomen: Soft/+BS, non tender, non distended, no guarding  Extremities: No edema, No lymphangitis, No petechiae, No rashes, no synovitis  Data Reviewed: Basic Metabolic Panel:  Recent Labs Lab 11/16/14 0343 11/17/14 0417 11/19/14 0425 11/20/14 0402 11/21/14 0452  NA 144 144 147 146 145  K 3.2* 3.6* 3.3* 3.1* 3.7  CL 103 108 111 112 113*  CO2 23 22 18* 20 23  GLUCOSE 84 108* 111* 128* 93  BUN 32* 28* 22 18 15   CREATININE 1.79* 1.64* 1.48* 1.39* 1.36*  CALCIUM 8.9 8.9 9.5 9.4 9.0  MG  --  2.3  --  2.0  --    Liver Function Tests:  Recent Labs Lab 11/15/14 1729  AST 14  ALT 6  ALKPHOS 90  BILITOT 0.2*  PROT 7.5  ALBUMIN 3.1*   No results for input(s): LIPASE, AMYLASE in the last 168 hours. No results for input(s): AMMONIA in the last 168 hours. CBC:  Recent Labs Lab 11/15/14 1729 11/16/14 0343 11/17/14 0417 11/19/14 0425 11/20/14 0402 11/21/14 0452  WBC 25.4* 21.5* 27.0* 25.8* 22.6* 26.1*  NEUTROABS 22.5*  --   --   --  19.9*  --   HGB 10.4* 9.0* 8.8* 9.0* 8.9* 9.6*  HCT 33.9* 28.6* 27.4* 28.4* 27.6* 30.5*  MCV 99.7 98.3 96.8 96.6 96.5 97.4  PLT 256 240 222 267 278 275   Cardiac Enzymes: No  results for input(s): CKTOTAL, CKMB, CKMBINDEX, TROPONINI in the last 168 hours. BNP: Invalid input(s): POCBNP CBG: No results for input(s): GLUCAP in the last 168 hours.  Recent Results (from the past 240 hour(s))  Culture, blood (routine x 2)     Status: None (Preliminary result)   Collection Time: 11/15/14  6:59 PM  Result Value Ref Range Status   Specimen  Description BLOOD RIGHT HAND  Final   Special Requests BOTTLES DRAWN AEROBIC ONLY  Final   Culture  Setup Time   Final    11/16/2014 05:47 Performed at [facility]    Culture   Final           BLOOD CULTURE RECEIVED NO GROWTH TO DATE CULTURE WILL BE HELD FOR 5 DAYS BEFORE ISSUING A FINAL NEGATIVE REPORT Performed at [facility]    Report Status PENDING  Incomplete  Culture, blood (routine x 2)     Status: None (Preliminary result)   Collection Time: 11/15/14  7:07 PM  Result Value Ref Range Status   Specimen Description BLOOD RIGHT FOREARM  Final   Special Requests BOTTLES DRAWN AEROBIC ONLY  Final   Culture  Setup Time   Final    11/16/2014 05:47 Performed at [facility]    Culture   Final           BLOOD CULTURE RECEIVED NO GROWTH TO DATE CULTURE WILL BE HELD FOR 5 DAYS BEFORE ISSUING A FINAL NEGATIVE REPORT Performed at [facility]    Report Status PENDING  Incomplete  MRSA PCR Screening     Status: Abnormal   Collection Time: 11/15/14 10:34 PM  Result Value Ref Range Status   MRSA by PCR POSITIVE (A) NEGATIVE Final    Comment:        The GeneXpert MRSA Assay (FDA approved for NASAL specimens only), is one component of a comprehensive MRSA colonization surveillance program. It is not intended to diagnose MRSA infection nor to guide or monitor treatment for MRSA infections. RESULT CALLED TO, READ BACK BY AND VERIFIED WITH: L.HUDSON,RN AT 0219 ON 11/16/14 BY [email or web address]   Clostridium Difficile by PCR     Status: None   Collection Time: 11/17/14 12:18 AM  Result Value Ref Range Status   C difficile by pcr NEGATIVE NEGATIVE Final    Comment: Performed at [facility]  Clostridium Difficile by PCR     Status: None   Collection Time: 11/19/14  6:59 PM  Result Value Ref Range Status   C difficile by pcr NEGATIVE NEGATIVE Final    Comment: Performed at [facility]  Surgical pcr screen     Status: Abnormal    Collection Time: 11/20/14  1:32 PM  Result Value Ref Range Status   MRSA, PCR POSITIVE (A) NEGATIVE Final    Comment: RESULT CALLED TO, READ BACK BY AND VERIFIED WITH: Reymundo Poll RN 1554 11/20/14 [place]    Staphylococcus aureus POSITIVE (A) NEGATIVE Final    Comment:        The Xpert SA Assay (FDA approved for NASAL specimens in patients over 69 years of age), is one component of a comprehensive surveillance program.  Test performance has been validated by [employer] for patients greater than or equal to 8 year old. It is not intended to diagnose infection nor to guide or monitor treatment.      Scheduled Meds: . antiseptic oral rinse  7 mL Mouth Rinse q12n4p  .  budesonide (PULMICORT) nebulizer solution  0.25 mg Nebulization BID  . chlorhexidine  15 mL Mouth Rinse BID  . enoxaparin (LOVENOX) injection  30 mg Subcutaneous Q24H  . hydrALAZINE  10 mg Intravenous Q6H  . latanoprost  1 drop Both Eyes QHS  . metoprolol  5 mg Intravenous 4 times per day  . piperacillin-tazobactam (ZOSYN)  IV  3.375 g Intravenous 3 times per day   Continuous Infusions: . 0.9 % NaCl with KCl 20 mEq / L 100 mL/hr at 11/21/14 1059     Tiffanni Scarfo, DO  [facility] Pager [phone]  If 7PM-7AM, please contact night-coverage [email or web address] Password [facility] 11/21/2014, 5:41 PM   LOS: 6 days

## 2014-11-21 NOTE — Progress Notes (Addendum)
Patient became loose from restraints. Patient pulled out NG tube. Surgeon on call notified. Per MD to leave NG tube out.  Will continue to monitor closely

## 2014-11-21 NOTE — Progress Notes (Addendum)
1 Day Post-Op  Subjective: Stable and alert. More talkative. States she is not hungry. Pulled her NG tube out again. We will simply leave it out. JP drainage is thin, serosanguineous, odorless. Temp 98.6. Heart rate 82. Respirations 20. BP 180/84. SPO2 100% Hemoglobin 9.6, stable. WBC 26,000. Etiology unclear. Potassium 3.7. Creatinine 1.36, stable. Glucose 93.  Objective: Vital signs in last 24 hours: Temp:  [98.1 F (36.7 C)-100.9 F (38.3 C)] 98.6 F (37 C) (12/22 0612) Pulse Rate:  [69-82] 82 (12/22 0612) Resp:  [18-31] 20 (12/22 0612) BP: (132-190)/(67-99) 180/84 mmHg (12/22 0612) SpO2:  [95 %-100 %] 100 % (12/22 0612) Weight:  [117 lb 1 oz (53.1 kg)] 117 lb 1 oz (53.1 kg) (12/22 0612) Last BM Date: 11/19/14  Intake/Output from previous day: 12/21 0701 - 12/22 0700 In: 2609.6 [I.V.:2509.6; IV Piggyback:100] Out: 1075 [Urine:850; Emesis/NG output:30; Drains:170; Blood:25] Intake/Output this shift: Total I/O In: 903.3 [I.V.:803.3; IV Piggyback:100] Out: 930 [Urine:750; Emesis/NG output:20; Drains:160]  General appearance: More awake and talkative this morning. Answers simple questions but has no insight into medical events and surgery yesterday. Doesn't appear to be in any physical distress Resp: clear to auscultation bilaterally GI: Abdomen is reasonably soft. Does not appear distended. Minimal bowel sounds. Wounds look good. Both drains draining thin serosanguineous fluid without odor. No enteric content. No purulence.  Lab Results:  Results for orders placed or performed during the hospital encounter of 11/15/14 (from the past 24 hour(s))  Surgical pcr screen     Status: Abnormal   Collection Time: 11/20/14  1:32 PM  Result Value Ref Range   MRSA, PCR POSITIVE (A) NEGATIVE   Staphylococcus aureus POSITIVE (A) NEGATIVE  Type and screen     Status: None   Collection Time: 11/20/14  2:25 PM  Result Value Ref Range   ABO/RH(D) O POS    Antibody Screen NEG    Sample  Expiration 11/23/2014   ABO/Rh     Status: None   Collection Time: 11/20/14  2:25 PM  Result Value Ref Range   ABO/RH(D) O POS   Basic metabolic panel     Status: Abnormal   Collection Time: 11/21/14  4:52 AM  Result Value Ref Range   Sodium 145 135 - 145 mmol/L   Potassium 3.7 3.5 - 5.1 mmol/L   Chloride 113 (H) 96 - 112 mEq/L   CO2 23 19 - 32 mmol/L   Glucose, Bld 93 70 - 99 mg/dL   BUN 15 6 - 23 mg/dL   Creatinine, Ser 9.561.36 (H) 0.50 - 1.10 mg/dL   Calcium 9.0 8.4 - 21.310.5 mg/dL   GFR calc non Af Amer 35 (L) >90 mL/min   GFR calc Af Amer 41 (L) >90 mL/min   Anion gap 9 5 - 15  CBC     Status: Abnormal   Collection Time: 11/21/14  4:52 AM  Result Value Ref Range   WBC 26.1 (H) 4.0 - 10.5 K/uL   RBC 3.13 (L) 3.87 - 5.11 MIL/uL   Hemoglobin 9.6 (L) 12.0 - 15.0 g/dL   HCT 08.630.5 (L) 57.836.0 - 46.946.0 %   MCV 97.4 78.0 - 100.0 fL   MCH 30.7 26.0 - 34.0 pg   MCHC 31.5 30.0 - 36.0 g/dL   RDW 62.913.7 52.811.5 - 41.315.5 %   Platelets 275 150 - 400 K/uL     Studies/Results: No results found.  Marland Kitchen. antiseptic oral rinse  7 mL Mouth Rinse q12n4p  . budesonide (PULMICORT) nebulizer solution  0.25 mg Nebulization BID  . chlorhexidine  15 mL Mouth Rinse BID  . enoxaparin (LOVENOX) injection  30 mg Subcutaneous Q24H  . latanoprost  1 drop Both Eyes QHS  . metoprolol  2.5 mg Intravenous 4 times per day  . piperacillin-tazobactam (ZOSYN)  IV  3.375 g Intravenous 3 times per day     Assessment/Plan: s/p Procedure(s): Diagnostic laparoscopy with placement of drains  POD #1. Diagnostic laparoscopy with placement of drains. No abnormal intra-abdominal findings.  Pneumoperitoneum of uncertain etiology. This may be a benign retroperitoneal pneumoperitoneum or may be a walled off perforation. Continue antibiotics Nothing by mouth   if stabilizes, repeat CT scan in a few days.  Leukocytosis. Pulmonary vs. Abdominal source. Zosyn.  Numerous comorbidities, including dementia, aspiration pneumonia,  leukocytosis, acute on chronic renal failure, atrial fibrillation, hypertension, chronic diastolic congestive heart failure    @PROBHOSP @  LOS: 6 days    Kristi [name] M 11/21/2014  . .prob

## 2014-11-21 NOTE — Progress Notes (Signed)
ANTIBIOTIC CONSULT NOTE - Follow up  Pharmacy Consult for  Zosyn Indication: aspiration PNA/possible perforation  Allergies  Allergen Reactions  . Atenolol Other (See Comments)    unknown  . Ramipril Cough  . Simvastatin Other (See Comments)    unknown    Patient Measurements: Height: 5' (152.4 cm) Weight: 117 lb 1 oz (53.1 kg) IBW/kg (Calculated) : 45.5 Height: 61 inches Weight: 52.7 kg  Vital Signs: Temp: 99.4 F (37.4 C) (12/22 0815) Temp Source: Axillary (12/22 0815) BP: 185/95 mmHg (12/22 1025) Pulse Rate: 74 (12/22 1025) Intake/Output from previous day: 12/21 0701 - 12/22 0700 In: 2609.6 [I.V.:2509.6; IV Piggyback:100] Out: 1075 [Urine:850; Emesis/NG output:30; Drains:170; Blood:25] Intake/Output from this shift: Total I/O In: -  Out: 140 [Drains:140]  Labs:  Recent Labs  11/19/14 0425 11/20/14 0402 11/21/14 0452  WBC 25.8* 22.6* 26.1*  HGB 9.0* 8.9* 9.6*  PLT 267 278 275  CREATININE 1.48* 1.39* 1.36*   Estimated Creatinine Clearance: 23.3 mL/min (by C-G formula based on Cr of 1.36). No results for input(s): VANCOTROUGH, VANCOPEAK, VANCORANDOM, GENTTROUGH, GENTPEAK, GENTRANDOM, TOBRATROUGH, TOBRAPEAK, TOBRARND, AMIKACINPEAK, AMIKACINTROU, AMIKACIN in the last 72 hours.   Microbiology: Recent Results (from the past 720 hour(s))  Culture, blood (routine x 2)     Status: None (Preliminary result)   Collection Time: 11/15/14  6:59 PM  Result Value Ref Range Status   Specimen Description BLOOD RIGHT HAND  Final   Special Requests BOTTLES DRAWN AEROBIC ONLY 1ML  Final   Culture  Setup Time   Final    11/16/2014 05:47 Performed at [facility]    Culture   Final           BLOOD CULTURE RECEIVED NO GROWTH TO DATE CULTURE WILL BE HELD FOR 5 DAYS BEFORE ISSUING A FINAL NEGATIVE REPORT Performed at [facility]    Report Status PENDING  Incomplete  Culture, blood (routine x 2)     Status: None (Preliminary result)   Collection Time:  11/15/14  7:07 PM  Result Value Ref Range Status   Specimen Description BLOOD RIGHT FOREARM  Final   Special Requests BOTTLES DRAWN AEROBIC ONLY 2ML  Final   Culture  Setup Time   Final    11/16/2014 05:47 Performed at [facility]    Culture   Final           BLOOD CULTURE RECEIVED NO GROWTH TO DATE CULTURE WILL BE HELD FOR 5 DAYS BEFORE ISSUING A FINAL NEGATIVE REPORT Performed at [facility]    Report Status PENDING  Incomplete  MRSA PCR Screening     Status: Abnormal   Collection Time: 11/15/14 10:34 PM  Result Value Ref Range Status   MRSA by PCR POSITIVE (A) NEGATIVE Final    Comment:        The GeneXpert MRSA Assay (FDA approved for NASAL specimens only), is one component of a comprehensive MRSA colonization surveillance program. It is not intended to diagnose MRSA infection nor to guide or monitor treatment for MRSA infections. RESULT CALLED TO, READ BACK BY AND VERIFIED WITH: L.HUDSON,RN AT 0219 ON 11/16/14 BY SHEA.W   Clostridium Difficile by PCR     Status: None   Collection Time: 11/17/14 12:18 AM  Result Value Ref Range Status   C difficile by pcr NEGATIVE NEGATIVE Final    Comment: [facility]formed [facility]  Clostridium Difficile by PCR     Status: None   Collection Time: 11/19/14  6:59 PM  Result Value Ref Range Status   C difficile by pcr NEGATIVE NEGATIVE Final    Comment: Performed [facility]  Surgical pcr screen     Status: Abnormal   Collection Time: 11/20/14  1:32 PM  Result Value Ref Range Status   MRSA, PCR POSITIVE (A) NEGATIVE Final    Comment: RESULT CALLED TO, READ BACK BY AND VERIFIED WITH: Reymundo PollDUNKELBERGER K RN [id number] 11/20/14 [place]    Staphylococcus aureus POSITIVE (A) NEGATIVE Final    Comment:        The Xpert SA Assay (FDA approved for NASAL specimens in patients over 78 years of age), is one component of a comprehensive surveillance program.  Test performance has been validated by  [facility] for patients greater than or equal to 78 year old. It is not intended to diagnose infection nor to guide or monitor treatment.     Medical History: Past Medical History  Diagnosis Date  . Atrial fibrillation     Rapid response, hospital, September, 2012  . CHF (congestive heart failure)     Combined systolic and diastolic, EF 40-45%, 2012  . Ejection fraction     EF 40-45%, echo, September, 2012  . History of noncompliance with medical treatment   . Hypertension   . Dyslipidemia   . Dementia      ? mild ?  . CAD (coronary artery disease)     PCI to LAD early 1990s  . Degenerative joint disease   . Chest pain     Chest wall pain and costochondritis  . CKD (chronic kidney disease), stage IV   . Edema     Left greater than right lower extremity, No DVT by Doppler, September, 2012  . Aortic insufficiency     Mild, echo, September, 2012  . Mitral regurgitation     Moderate, echo, 2012  . Pulmonary hypertension     46 mmHg, echo, 2012  . Hyperlipidemia   . Bradycardia     October, 2012  . Fatigue     October, 2012    Medications:  Scheduled:  . antiseptic oral rinse  7 mL Mouth Rinse q12n4p  . budesonide (PULMICORT) nebulizer solution  0.25 mg Nebulization BID  . chlorhexidine  15 mL Mouth Rinse BID  . enoxaparin (LOVENOX) injection  30 mg Subcutaneous Q24H  . hydrALAZINE  10 mg Intravenous Q6H  . latanoprost  1 drop Both Eyes QHS  . metoprolol  5 mg Intravenous 4 times per day  . piperacillin-tazobactam (ZOSYN)  IV  3.375 g Intravenous 3 times per day   Infusions:  . 0.9 % NaCl with KCl 20 mEq / L 100 mL/hr at 11/21/14 1059   PRN:   Assessment: 80 yo female with dementia presents from nursing facility for shaking and shivering. Pharmacy originally consulted to dose vancomycin and zosyn for r/o sepsis.  Vancomycin discontinued on 12/18.  12/16 >> Vancomycin >>12/18 12/16 >> Zosyn >>  Tmax: 100.9 on 12/21 WBC: 26.1, worse Renal: SCr 1.36,  stable, CrCl 23 ml/min  12/16 blood x2:NGTD 12/18 Cdiff: negative  Goal of Therapy:  Zosyn dose appropriate for indication, renal function  Plan:  Continue Zosyn 3.375gm IV q8h (4hr extended infusions) for CrCl > 20 ml/min Follow renal function / clinical course   Hessie KnowsJustin M Jadarion Halbig, PharmD, BCPS Pager [phone] 11/21/2014 12:35 PM

## 2014-11-21 NOTE — Evaluation (Signed)
Occupational Therapy Evaluation Patient Details Name: Kristi DubinKatherine K [name] MRN: [id number] DOB: 11/13/1931 Today's Date: 11/21/2014    History of Present Illness Pt is an 80 y.o. female with a history of CAD, A.fib, CHF, HTN, and dementia residing [facility] in [facility] care and admitted with fever and ?aspiration pneumonia. 12/21-diagnostic lap with drain (2) placement   Clinical Impression    Pt from memory care unit of ALF who ambulated prior to admission.  Presents with generalized weakness requiring +2 total assistance for ADL and all mobility.  Recommending SNF level care upon d/c.  Will defer further OT to SNF.   Follow Up Recommendations  SNF;Supervision/Assistance - 24 hour    Equipment Recommendations       Recommendations for Other Services       Precautions / Restrictions Precautions Precautions: Fall Precaution Comments: drains, L and R Restrictions Weight Bearing Restrictions: No      Mobility Bed Mobility Overal bed mobility: Needs Assistance;+2 for physical assistance;+ 2 for safety/equipment Bed Mobility: Supine to Sit;Sit to Supine     Supine to sit: Total assist;+2 for physical assistance;HOB elevated Sit to supine: Total assist;+2 for physical assistance   General bed mobility comments: assisted for LEs and trunk, repeatedly saying she did not feel good. Increased time. Utilized bedpad for scooting, positioning.   Transfers Overall transfer level: Needs assistance Equipment used: Rolling walker (2 wheeled);None Transfers: Sit to/from [employer];Stand Pivot Transfers Sit to Stand: Mod assist;+2 physical assistance;+2 safety/equipment;From elevated surface Stand pivot transfers: Mod assist;+2 physical assistance;+2 safety/equipment;From elevated surface       General transfer comment: sit to stand x 2 -once with walker, once without in preparation for pivot. Assisted hip forward and up using bedpad. Pt shuffled her feet to assist. Increased  time. Max encouragement for participation, progression of activity.     Balance Overall balance assessment: Needs assistance Sitting-balance support: Bilateral upper extremity supported;Feet supported Sitting balance-Leahy Scale: Poor Sitting balance - Comments: requires B UE support   Standing balance support: Bilateral upper extremity supported Standing balance-Leahy Scale: Zero Standing balance comment: flexed posture and mod posterior lean especially when pt is attempting to sit (in resistance to standing)                            ADL Overall ADL's : Needs assistance/impaired Eating/Feeding: NPO   Grooming: Sitting;Total assistance Grooming Details (indicate cue type and reason): hair Upper Body Bathing: Total assistance;Sitting   Lower Body Bathing: +2 for physical assistance;Total assistance;Sit to/from stand   Upper Body Dressing : Maximal assistance;Sitting   Lower Body Dressing: +2 for physical assistance;Total assistance;Sit to/from stand   Toilet Transfer: +2 for physical assistance;Moderate assistance;Stand-pivot Toilet Transfer Details (indicate cue type and reason): simulated to chair Toileting- Clothing Manipulation and Hygiene: +2 for physical assistance;Total assistance;Sit to/from stand Toileting - Clothing Manipulation Details (indicate cue type and reason): pt with bowel incontinence, sore buttocks             Vision                     Perception     Praxis      Pertinent Vitals/Pain Pain Assessment: No/denies pain     Hand Dominance Right   Extremity/Trunk Assessment Upper Extremity Assessment Upper Extremity Assessment: Generalized weakness   Lower Extremity Assessment Lower Extremity Assessment: Defer to PT evaluation       Communication Communication Communication: No  difficulties   Cognition Arousal/Alertness: Awake/alert Behavior During Therapy: Flat affect Overall Cognitive Status: History of cognitive  impairments - at baseline                     General Comments       Exercises       Shoulder Instructions      Home Living Family/patient expects to be discharged to:: Assisted living                                 Additional Comments: pt poor historian, per chart from  ALF memory care      Prior Functioning/Environment          Comments: uncertain assist provided from ALF and pt poor historian but reports she is able to ambulate    OT Diagnosis: Generalized weakness;Cognitive deficits   OT Problem List: Decreased strength;Impaired balance (sitting and/or standing);Decreased cognition;Decreased safety awareness;Decreased knowledge of use of DME or AE;Decreased activity tolerance   OT Treatment/Interventions:      OT Goals(Current goals can be found in the care plan section)    OT Frequency:     Barriers to D/C:            Co-evaluation PT/OT/SLP Co-Evaluation/Treatment: Yes Reason for Co-Treatment: For patient/therapist safety PT goals addressed during session: Mobility/safety with mobility;Balance;Proper use of DME OT goals addressed during session: ADL's and self-care      End of Session Equipment Utilized During Treatment: Rolling walker Nurse Communication: Mobility status;Other (comment) (L JP drain without suction, sacral dressing needs changing)  Activity Tolerance: Patient limited by fatigue Patient left: in chair;with call bell/phone within reach;with chair alarm set;with nursing/sitter in room   Time: [id number] OT Time Calculation (min): 30 min Charges:  OT General Charges $OT Visit: 1 Procedure OT Evaluation $Initial OT Evaluation Tier I: 1 Procedure G-Codes:    Kristi [name] 11/21/2014, 2:37 PM [phone]

## 2014-11-21 NOTE — Progress Notes (Signed)
Left JP drain dressing leaking. Dressing changed, clean, dry, and intact JP drainage serousaingous. Will continue to monitor closely.

## 2014-11-21 NOTE — Progress Notes (Signed)
Physical Therapy Treatment Patient Details Name: Kristi [name] MRN: [id number] DOB: [birth year] Today's Date: 11/21/2014    History of Present Illness Pt is an 80 y.o. female with a history of CAD, A.fib, CHF, HTN, and dementia residing at the [facility] in [facility] care and admitted with fever and ?aspiration pneumonia. 12/21-diagnostic lap with drain (2) placement    PT Comments    Progressing very slowly with mobility. Pt requires Max encouragement for participation-can become resistant at times. Daughter present during session-assisted with encouraging pt.   Follow Up Recommendations  SNF;Supervision/Assistance - 24 hour     Equipment Recommendations  None recommended by PT    Recommendations for Other Services       Precautions / Restrictions Precautions Precautions: Fall Precaution Comments: drains, L and R Restrictions Weight Bearing Restrictions: No    Mobility  Bed Mobility Overal bed mobility: Needs Assistance;+2 for physical assistance;+ 2 for safety/equipment Bed Mobility: Supine to Sit;Sit to Supine     Supine to sit: Total assist;+2 for physical assistance;HOB elevated Sit to supine: Total assist;+2 for physical assistance   General bed mobility comments: assisted for LEs and trunk, repeatedly saying she did not feel good. Increased time. Utilized bedpad for scooting, positioning.   Transfers Overall transfer level: Needs assistance Equipment used: Rolling walker (2 wheeled);None Transfers: Sit to/from [employer];Stand Pivot Transfers Sit to Stand: Mod assist;+2 physical assistance;+2 safety/equipment;From elevated surface Stand pivot transfers: Mod assist;+2 physical assistance;+2 safety/equipment;From elevated surface       General transfer comment: sit to stand x 2 -once with walker, once without in preparation for pivot. Assisted hip forward and up using bedpad. Pt shuffled her feet to assist. Increased time. Max encouragement for  participation, progression of activity.   Ambulation/Gait             General Gait Details: NT-unable to safely attempt this session   Stairs            Wheelchair Mobility    Modified Rankin (Stroke Patients Only)       Balance Overall balance assessment: Needs assistance Sitting-balance support: Bilateral upper extremity supported;Feet supported Sitting balance-Leahy Scale: Poor Sitting balance - Comments: requires B UE support   Standing balance support: Bilateral upper extremity supported Standing balance-Leahy Scale: Zero Standing balance comment: flexed posture and mod posterior lean especially when pt is attempting to sit (in resistance to standing)                    Cognition Arousal/Alertness: Awake/alert Behavior During Therapy: Flat affect Overall Cognitive Status: History of cognitive impairments - at baseline                      Exercises      General Comments        Pertinent Vitals/Pain Pain Assessment: No/denies pain    Home Living Family/patient expects to be discharged to:: Assisted living               Additional Comments: pt poor historian, per chart from  ALF memory care    Prior Function        Comments: uncertain assist provided from ALF and pt poor historian but reports she is able to ambulate   PT Goals (current goals can now be found in the care plan section) Progress towards PT goals: Progressing toward goals (very slowly)    Frequency  Min 2X/week    PT Plan Current plan remains appropriate  Co-evaluation PT/OT/SLP Co-Evaluation/Treatment: Yes Reason for Co-Treatment: For patient/therapist safety PT goals addressed during session: Mobility/safety with mobility;Balance;Proper use of DME OT goals addressed during session: ADL's and self-care     End of Session   Activity Tolerance: Patient limited by fatigue (Limited by pt participation) Patient left: in chair;with call bell/phone  within reach;with chair alarm set;with family/visitor present     Time: [id number] PT Time Calculation (min) (ACUTE ONLY): 30 min  Charges:  $Therapeutic Activity: 23-37 mins                    G Codes:      Rebeca AlertJannie Jenasia Dolinar, MPT Pager: [phone]

## 2014-11-22 LAB — CBC
HCT: 29.5 % — ABNORMAL LOW (ref 36.0–46.0)
HEMOGLOBIN: 9.3 g/dL — AB (ref 12.0–15.0)
MCH: 30.7 pg (ref 26.0–34.0)
MCHC: 31.5 g/dL (ref 30.0–36.0)
MCV: 97.4 fL (ref 78.0–100.0)
Platelets: 327 10*3/uL (ref 150–400)
RBC: 3.03 MIL/uL — AB (ref 3.87–5.11)
RDW: 13.9 % (ref 11.5–15.5)
WBC: 23.1 10*3/uL — ABNORMAL HIGH (ref 4.0–10.5)

## 2014-11-22 LAB — CULTURE, BLOOD (ROUTINE X 2)
Culture: NO GROWTH
Culture: NO GROWTH

## 2014-11-22 LAB — COMPREHENSIVE METABOLIC PANEL
ALK PHOS: 64 U/L (ref 39–117)
ALT: 26 U/L (ref 0–35)
ANION GAP: 7 (ref 5–15)
AST: 29 U/L (ref 0–37)
Albumin: 2.5 g/dL — ABNORMAL LOW (ref 3.5–5.2)
BILIRUBIN TOTAL: 0.5 mg/dL (ref 0.3–1.2)
BUN: 14 mg/dL (ref 6–23)
CHLORIDE: 112 meq/L (ref 96–112)
CO2: 19 mmol/L (ref 19–32)
CREATININE: 1.28 mg/dL — AB (ref 0.50–1.10)
Calcium: 8.5 mg/dL (ref 8.4–10.5)
GFR calc Af Amer: 44 mL/min — ABNORMAL LOW (ref >90)
GFR calc non Af Amer: 38 mL/min — ABNORMAL LOW (ref >90)
Glucose, Bld: 114 mg/dL — ABNORMAL HIGH (ref 70–99)
POTASSIUM: 3.6 mmol/L (ref 3.5–5.1)
Sodium: 138 mmol/L (ref 135–145)
Total Protein: 6.1 g/dL (ref 6.0–8.3)

## 2014-11-22 LAB — LIPASE, BLOOD: Lipase: 23 U/L (ref 11–59)

## 2014-11-22 MED ORDER — HYDRALAZINE HCL 20 MG/ML IJ SOLN
10.0000 mg | Freq: Four times a day (QID) | INTRAMUSCULAR | Status: DC | PRN
  Administered 2014-11-23 – 2014-11-24 (×2): 10 mg via INTRAVENOUS
  Filled 2014-11-22 (×4): qty 1

## 2014-11-22 MED ORDER — HYDRALAZINE HCL 20 MG/ML IJ SOLN
20.0000 mg | Freq: Four times a day (QID) | INTRAMUSCULAR | Status: DC
  Administered 2014-11-22 – 2014-11-25 (×10): 20 mg via INTRAVENOUS
  Filled 2014-11-22 (×14): qty 1

## 2014-11-22 NOTE — Progress Notes (Addendum)
[facility]                                                                              Patient Demographics  Kristi [name], is a 80 y.o. female, DOB - 05-14-1931, ZOX:[id number]  Admit date - 11/15/2014   Admitting Physician Ron Parker, MD  Outpatient Primary MD for the patient is No primary care provider on file.  LOS - 7   Chief Complaint  Patient presents with  . Shaking        Assessment & Plan   Sepsis secondary to HCAP/aspiration pneumonia vs peritonitis -Sepsis was present at the time of admission -Repeat chest x-ray 11/18/2014 showed RLL consolidation -Influenza PCR negative -Continue Zosyn and IV fluids -Blood cultures negative from 11/15/2014 -Urinalysis also negative  Pneumoperitoneum -Gen. surgery, Dr. Derrell Lolling, consulted and appreciated -Concern for perforated viscus with peritonitis -11/20/2014 case was discussed with Dr. Derrell Lolling, diagnostic laparoscopy conducted which did not reveal any conclusive findings of perforation -Per surgery, repeat CT of the abdomen and pelvis within a few days -Continue Zosyn -Started on clears today  Acute on chronic renal failure, stage III -Baseline creatinine appears to be 1.1-1.4 -Resolved -Likely secondary to sepsis and volume depletion -Continue to monitor BMP  Atrial fibrillation -Currently rate controlled -Continue metoprolol IV  Hypertension -Oral medications currently held -Continue IV hydralazine and  IV Lopressor   Dysphagia with history of esophageal stricture -Barium esophagogram was negative for stricture shows esophageal dysmotility  Dementia -Continue Aricept and Namenda  Chronic diastolic heart failure -Currently appears euvolemic and compensated -Lasix held -Continue to monitor daily weights, intake and output  Hypernatremia -Resolved likely secondary to volume depletion  Normocytic anemia -Hemoglobin appears to be stable, possibly dilutional component -Continue to  monitor CBC  Code Status: DO NOT RESUSCITATE  Family Communication: None at bedside  Disposition Plan: Admitted, will ask case management to review for LTAC  Time Spent in minutes   30 minutes  Procedures  Laparoscopy 11/20/2014  Consults   Gen. surgery  DVT Prophylaxis  Lovenox  Lab Results  Component Value Date   PLT 327 11/22/2014    Medications  Scheduled Meds: . antiseptic oral rinse  7 mL Mouth Rinse q12n4p  . budesonide (PULMICORT) nebulizer solution  0.25 mg Nebulization BID  . chlorhexidine  15 mL Mouth Rinse BID  . enoxaparin (LOVENOX) injection  30 mg Subcutaneous Q24H  . hydrALAZINE  10 mg Intravenous Q6H  . latanoprost  1 drop Both Eyes QHS  . metoprolol  5 mg Intravenous 4 times per day  . piperacillin-tazobactam (ZOSYN)  IV  3.375 g Intravenous 3 times per day   Continuous Infusions: . 0.9 % NaCl with KCl 20 mEq / L 100 mL/hr at 11/22/14 0932   PRN Meds:.hydrALAZINE, HYDROmorphone (DILAUDID) injection, ondansetron **OR** ondansetron (ZOFRAN) IV  Antibiotics    Anti-infectives    Start     Dose/Rate Route Frequency Ordered Stop   11/20/14 2200  piperacillin-tazobactam (ZOSYN) IVPB 3.375 g     3.375 g12.5 mL/hr over 240 Minutes Intravenous 3 times per day 11/20/14 1749     11/16/14 2200  vancomycin (VANCOCIN) 500 mg in sodium chloride 0.9 % 100 mL IVPB  Status:  Discontinued     500 mg100 mL/hr over 60 Minutes Intravenous Every 24 hours 11/15/14 1916 11/17/14 1600   11/16/14 0400  piperacillin-tazobactam (ZOSYN) IVPB 3.375 g  Status:  Discontinued     3.375 g12.5 mL/hr over 240 Minutes Intravenous Every 8 hours 11/15/14 1916 11/20/14 1811   11/15/14 1915  vancomycin (VANCOCIN) IVPB 1000 mg/200 mL premix     1,000 mg200 mL/hr over 60 Minutes Intravenous STAT 11/15/14 1909 11/15/14 2320   11/15/14 1915  piperacillin-tazobactam (ZOSYN) IVPB 3.375 g     3.375 g100 mL/hr over 30 Minutes Intravenous STAT 11/15/14 1909 11/15/14 2011        Subjective:     Kristi EldersKatherine [name] seen and examined today.     Objective:   Filed Vitals:   11/22/14 0550 11/22/14 0559 11/22/14 0641 11/22/14 1018  BP:  153/116  143/117  Pulse: 103   138  Temp: 97.7 F (36.5 C)   99 F (37.2 C)  TempSrc: Oral   Oral  Resp: 18   20  Height:      Weight:   56.7 kg (125 lb)   SpO2: 100%   99%    Wt Readings from Last 3 Encounters:  11/22/14 56.7 kg (125 lb)  12/20/11 50.758 kg (111 lb 14.4 oz)  09/03/11 46.72 kg (103 lb)     Intake/Output Summary (Last 24 hours) at 11/22/14 1100 Last data filed at 11/22/14 0700  Gross per 24 hour  Intake   2450 ml  Output   1465 ml  Net    985 ml    Exam  General: Well developed, NAD, appears stated age  HEENT: NCAT, mucous membranes moist.   Cardiovascular: S1 S2 auscultated, Regular rate and rhythm.  Respiratory: Clear to auscultation bilaterally with equal chest rise  Abdomen: Soft, nontender, nondistended, + bowel sounds, JP drains in place-serosanguineous fluid  Extremities: warm dry without cyanosis clubbing or edema  Neuro: Awake and alert, oriented to self, no focal deficits, follows commands  Data Review   Micro Results Recent Results (from the past 240 hour(s))  Culture, blood (routine x 2)     Status: None   Collection Time: 11/15/14  6:59 PM  Result Value Ref Range Status   Specimen Description BLOOD RIGHT HAND  Final   Special Requests BOTTLES DRAWN AEROBIC ONLY 1ML  Final   Culture  Setup Time   Final    11/16/2014 05:47 Performed at [facility]    Culture   Final    NO GROWTH 5 DAYS Performed at [facility]    Report Status 11/22/2014 FINAL  Final  Culture, blood (routine x 2)     Status: None   Collection Time: 11/15/14  7:07 PM  Result Value Ref Range Status   Specimen Description BLOOD RIGHT FOREARM  Final   Special Requests BOTTLES DRAWN AEROBIC ONLY 2ML  Final   Culture  Setup Time   Final    11/16/2014 05:47 Performed at [facility]     Culture   Final    NO GROWTH 5 DAYS Performed at [facility]    Report Status 11/22/2014 FINAL  Final  MRSA PCR Screening     Status: Abnormal   Collection Time: 11/15/14 10:34 PM  Result Value Ref Range Status   MRSA by PCR POSITIVE (A) NEGATIVE Final    Comment:        The GeneXpert MRSA Assay (FDA approved for NASAL specimens only), is  one component of a comprehensive MRSA colonization surveillance program. It is not intended to diagnose MRSA infection nor to guide or monitor treatment for MRSA infections. RESULT CALLED TO, READ BACK BY AND VERIFIED WITH: L.HUDSON,RN AT 0219 ON 11/16/14 BY [email or web address]   Clostridium Difficile by PCR     Status: None   Collection Time: 11/17/14 12:18 AM  Result Value Ref Range Status   C difficile by pcr NEGATIVE NEGATIVE Final    Comment: Performed at [facility]  Clostridium Difficile by PCR     Status: None   Collection Time: 11/19/14  6:59 PM  Result Value Ref Range Status   C difficile by pcr NEGATIVE NEGATIVE Final    Comment: Performed at [facility]  Surgical pcr screen     Status: Abnormal   Collection Time: 11/20/14  1:32 PM  Result Value Ref Range Status   MRSA, PCR POSITIVE (A) NEGATIVE Final    Comment: RESULT CALLED TO, READ BACK BY AND VERIFIED WITH: Reymundo Poll RN 1554 11/20/14 COL[place]TE V    Staphylococcus aureus POSITIVE (A) NEGATIVE Final    Comment:        The Xpert SA Assay (FDA approved for NASAL specimens in patients over 86 years of age), is one component of a comprehensive surveillance program.  Test performance has been validated by [employer] for patients greater than or equal to 56 year old. It is not intended to diagnose infection nor to guide or monitor treatment.     Radiology Reports Ct Abdomen Pelvis Wo Contrast  11/19/2014   CLINICAL DATA:  Fever and chills. Leukocytosis with presumed aspiration pneumonia. History of coronary artery disease, dementia and renal  insufficiency. Initial encounter.  EXAM: CT ABDOMEN AND PELVIS WITHOUT CONTRAST  TECHNIQUE: Multidetector CT imaging of the abdomen and pelvis was performed following the standard protocol without IV contrast.  COMPARISON:  Chest and abdominal radiographs 11/18/2014. Report from abdominal CT 01/29/2001.  FINDINGS: Lower chest: There are small to moderate dependent pleural effusions bilaterally with associated dependent bibasilar airspace opacities. These are most consistent with atelectasis. There is no consolidation or significant pericardial effusion. Extensive atherosclerosis of the coronary arteries and thoracic aorta noted. The heart is enlarged.  Hepatobiliary: The liver demonstrates decreased density consistent with steatosis. No focal lesions are identified on non contrast imaging. The gallbladder is surgically absent. There is air within the extrahepatic and left intrahepatic biliary system.  Pancreas:  Unremarkable on noncontrast imaging.  Spleen: Normal in size without focal abnormality.  Adrenals/Urinary Tract: Both adrenal glands are somewhat indistinct without focal mass or hematoma.The left kidney demonstrates asymmetric cortical thinning and atrophy. There is some extraluminal gas around the left renal hilum, but no definite air within its collecting system. There is no evidence of hydronephrosis or ureteral calculus. The bladder appears unremarkable.  Stomach/Bowel: Contrast from recent esophagram is present within the colon. There is no colonic wall thickening or extravasated enteric contrast. Diverticular changes are present throughout the colon. There is no definite bowel wall thickening or focal surrounding inflammation. However, multiple extraluminal air bubbles are present. These are most pronounced within the right upper quadrant, inferior to the cholecystectomy bed. Extraluminal air tracks inferiorly along the retroperitoneum. There are several extraluminal air bubbles within the  pelvis.There is no free intraperitoneal air or drainable fluid collection.  Vascular/Lymphatic: There are no enlarged abdominal or pelvic lymph nodes. Aortoiliac atherosclerosis noted.  Reproductive: There are vascular calcifications within the uterus. No evidence of adnexal mass.  Other:  There is generalized soft tissue edema affecting the subcutaneous and mesenteric fat. No abdominal wall hernia identified.  Musculoskeletal: No acute or significant osseous findings. There are degenerative changes throughout the spine with a grade 1 anterolisthesis at the lower 3 lumbar disc space levels.  IMPRESSION: 1. Multiple extraluminal air collections are present within the abdomen and pelvis, primarily within the retroperitoneum, consistent with a perforated viscus. Because most of the air is in the right upper quadrant, this could be due to a perforated duodenal ulcer. Alternatively, findings could be secondary to perforated sigmoid diverticulitis. There is no free air or drainable fluid collection. 2. Pneumobilia, probably related to previous sphincterotomy or biliary bypass. 3. Bilateral pleural effusions with dependent opacities at both lung bases. No definite aspiration pneumonia demonstrated by this examination. 4. These results were called by telephone at the time of interpretation on 11/19/2014 at 7:50 pm to the patient's nurse, Tinnie GensJennie, who verbally acknowledged these results.   Electronically Signed   By: Roxy HorsemanBill  Veazey M.D.   On: 11/19/2014 19:53   Dg Chest 2 View  11/18/2014   CLINICAL DATA:  Shortness of breath  EXAM: CHEST  2 VIEW  COMPARISON:  Chest radiograph 11/15/2014  FINDINGS: Stable enlarged cardiac and mediastinal contours with tortuosity and calcification of thoracic aorta. Interval development of focal heterogeneous opacity within the right lung base. Thoracic spine degenerative change.  IMPRESSION: New focal consolidative opacity within the peripheral right lower lobe, concerning for infection in  the appropriate clinical setting. Atelectasis would be an alternative consideration. Recommend short term radiographic followup to ensure resolution.   Electronically Signed   By: Annia Beltrew  Davis M.D.   On: 11/18/2014 12:11   Dg Chest 2 View  11/15/2014   CLINICAL DATA:  80 year old female with abnormal shaking, chills. Initial encounter.  EXAM: CHEST  2 VIEW  COMPARISON:  12/17/2011.  FINDINGS: Low lung volumes. Chronic cardiomegaly. Stable cardiac size and mediastinal contours. No pneumothorax or pulmonary edema. No pleural effusion or consolidation. Eventration of the diaphragm re - identified. No acute osseous abnormality identified. Stable cholecystectomy clips.  IMPRESSION: Low lung volumes, otherwise no acute cardiopulmonary abnormality.   Electronically Signed   By: Augusto GambleLee  Hall M.D.   On: 11/15/2014 16:35   Dg Esophagus  11/18/2014   CLINICAL DATA:  PT C/O DYSPHAGIA, N/V W/MEALS, FAMILY REPORTS H/O ESOPHAGEAL STRICTURE WHICH REQUIRED DILATION "A LONG TIME AGO"  EXAM: ESOPHOGRAM/BARIUM SWALLOW  TECHNIQUE: Single contrast examination was performed using  thin barium.  FLUOROSCOPY TIME:  1 min and 34 seconds  COMPARISON:  None.  FINDINGS: Pharyngeal swallowing function is grossly normal with no aspiration visualized.  Esophagus is normal in course. There is a small sliding hiatal hernia. There is no convincing stricture. No esophageal mass and no evidence of esophagitis. Study was somewhat limited by patient's probability to only take small swallows.  There is esophageal dysmotility with significant tertiary contractions and barium stasis. No reflux was documented during the exam.  IMPRESSION: 1. Esophageal dysmotility. 2. No convincing mass or stricture. 3. Small hiatal hernia.   Electronically Signed   By: Amie Portlandavid  Ormond M.D.   On: 11/18/2014 12:39   Dg Abd Portable 1v  11/18/2014   CLINICAL DATA:  Evaluate for clearance of barium from esophagram. Patient scheduled for CT.  EXAM: PORTABLE ABDOMEN - 1  VIEW  COMPARISON:  Earlier same day  FINDINGS: Residual barium contrast material is demonstrated throughout the colon to the level of the rectum. Relative paucity of small bowel gas. Right  upper quadrant surgical clips. Lumbar spine degenerative change.  IMPRESSION: Residual barium is demonstrated throughout the colon to the level of the rectum.   Electronically Signed   By: Annia Belt M.D.   On: 11/18/2014 17:32    CBC  Recent Labs Lab 11/15/14 1729  11/17/14 0417 11/19/14 0425 11/20/14 0402 11/21/14 0452 11/22/14 0415  WBC 25.4*  < > 27.0* 25.8* 22.6* 26.1* 23.1*  HGB 10.4*  < > 8.8* 9.0* 8.9* 9.6* 9.3*  HCT 33.9*  < > 27.4* 28.4* 27.6* 30.5* 29.5*  PLT 256  < > 222 267 278 275 327  MCV 99.7  < > 96.8 96.6 96.5 97.4 97.4  MCH 30.6  < > 31.1 30.6 31.1 30.7 30.7  MCHC 30.7  < > 32.1 31.7 32.2 31.5 31.5  RDW 13.4  < > 13.4 13.3 13.5 13.7 13.9  LYMPHSABS 1.8  --   --   --  1.6  --   --   MONOABS 1.2*  --   --   --  1.1*  --   --   EOSABS 0.0  --   --   --  0.0  --   --   BASOSABS 0.0  --   --   --  0.0  --   --   < > = values in this interval not displayed.  Chemistries   Recent Labs Lab 11/15/14 1729  11/17/14 0417 11/19/14 0425 11/20/14 0402 11/21/14 0452 11/22/14 0415  NA 141  < > 144 147 146 145 138  K 4.5  < > 3.6* 3.3* 3.1* 3.7 3.6  CL 101  < > 108 111 112 113* 112  CO2 25  < > 22 18* 20 23 19   GLUCOSE 111*  < > 108* 111* 128* 93 114*  BUN 38*  < > 28* 22 18 15 14   CREATININE 1.93*  < > 1.64* 1.48* 1.39* 1.36* 1.28*  CALCIUM 9.8  < > 8.9 9.5 9.4 9.0 8.5  MG  --   --  2.3  --  2.0  --   --   AST 14  --   --   --   --   --  29  ALT 6  --   --   --   --   --  26  ALKPHOS 90  --   --   --   --   --  64  BILITOT 0.2*  --   --   --   --   --  0.5  < > = values in this interval not displayed. ------------------------------------------------------------------------------------------------------------------ estimated creatinine clearance is 27.2 mL/min (by C-G formula  based on Cr of 1.28). ------------------------------------------------------------------------------------------------------------------ No results for input(s): HGBA1C in the last 72 hours. ------------------------------------------------------------------------------------------------------------------ No results for input(s): CHOL, HDL, LDLCALC, TRIG, CHOLHDL, LDLDIRECT in the last 72 hours. ------------------------------------------------------------------------------------------------------------------ No results for input(s): TSH, T4TOTAL, T3FREE, THYROIDAB in the last 72 hours.  Invalid input(s): FREET3 ------------------------------------------------------------------------------------------------------------------ No results for input(s): VITAMINB12, FOLATE, FERRITIN, TIBC, IRON, RETICCTPCT in the last 72 hours.  Coagulation profile No results for input(s): INR, PROTIME in the last 168 hours.  No results for input(s): DDIMER in the last 72 hours.  Cardiac Enzymes No results for input(s): CKMB, TROPONINI, MYOGLOBIN in the last 168 hours.  Invalid input(s): CK ------------------------------------------------------------------------------------------------------------------ Invalid input(s): POCBNP    Roselene Gray D.O. on 11/22/2014 at 11:00 AM  Between 7am to 7pm - Pager - [phone]  After 7pm go to [email or web address] - password  [email or web address]  And look for the night coverage person covering for me after hours  [facility] Office  [phone]

## 2014-11-22 NOTE — Progress Notes (Signed)
2 Days Post-Op  Subjective: Stable and alert. Pleasantly disoriented. Verbalizes well. Minimal distress clinically. Afebrile. Heart rate 103. BP elevated.   SPO2 100% on room air. JP drainage thin, serosanguineous, odorless. No purulence. No enteric content. Total output 350 mL. Leukocytosis persist, WBC 23,100. Hemoglobin stable at 9.3. Creatinine stable at 1.28.  Objective: Vital signs in last 24 hours: Temp:  [97.7 F (36.5 C)-99.4 F (37.4 C)] 97.7 F (36.5 C) (12/23 0550) Pulse Rate:  [72-103] 103 (12/23 0550) Resp:  [18-20] 18 (12/23 0550) BP: (140-185)/(78-116) 153/116 mmHg (12/23 0559) SpO2:  [8 %-100 %] 100 % (12/23 0550) Weight:  [125 lb (56.7 kg)] 125 lb (56.7 kg) (12/23 0641) Last BM Date: 11/20/14  Intake/Output from previous day: 12/22 0701 - 12/23 0700 In: 2450 [I.V.:2350; IV Piggyback:100] Out: 1525 [Urine:1175; Drains:350] Intake/Output this shift:    General appearance: Alert. Disoriented. No insight into medical events or their implications. No memory for operative event. GI: Soft. Mild tenderness. Hypoactive bowel sounds. Not distended. Wounds look good. Drainage not showing any signs of purulence or enteric content.  Lab Results:  Results for orders placed or performed during the hospital encounter of 11/15/14 (from the past 24 hour(s))  CBC     Status: Abnormal   Collection Time: 11/22/14  4:15 AM  Result Value Ref Range   WBC 23.1 (H) 4.0 - 10.5 K/uL   RBC 3.03 (L) 3.87 - 5.11 MIL/uL   Hemoglobin 9.3 (L) 12.0 - 15.0 g/dL   HCT 16.129.5 (L) 09.636.0 - 04.546.0 %   MCV 97.4 78.0 - 100.0 fL   MCH 30.7 26.0 - 34.0 pg   MCHC 31.5 30.0 - 36.0 g/dL   RDW 40.913.9 81.111.5 - 91.415.5 %   Platelets 327 150 - 400 K/uL  Lipase, blood     Status: None   Collection Time: 11/22/14  4:15 AM  Result Value Ref Range   Lipase 23 11 - 59 U/L  Comprehensive metabolic panel     Status: Abnormal   Collection Time: 11/22/14  4:15 AM  Result Value Ref Range   Sodium 138 135 - 145 mmol/L   Potassium 3.6 3.5 - 5.1 mmol/L   Chloride 112 96 - 112 mEq/L   CO2 19 19 - 32 mmol/L   Glucose, Bld 114 (H) 70 - 99 mg/dL   BUN 14 6 - 23 mg/dL   Creatinine, Ser 7.821.28 (H) 0.50 - 1.10 mg/dL   Calcium 8.5 8.4 - 95.610.5 mg/dL   Total Protein 6.1 6.0 - 8.3 g/dL   Albumin 2.5 (L) 3.5 - 5.2 g/dL   AST 29 0 - 37 U/L   ALT 26 0 - 35 U/L   Alkaline Phosphatase 64 39 - 117 U/L   Total Bilirubin 0.5 0.3 - 1.2 mg/dL   GFR calc non Af Amer 38 (L) >90 mL/min   GFR calc Af Amer 44 (L) >90 mL/min   Anion gap 7 5 - 15     Studies/Results: No results found.  Marland Kitchen. antiseptic oral rinse  7 mL Mouth Rinse q12n4p  . budesonide (PULMICORT) nebulizer solution  0.25 mg Nebulization BID  . chlorhexidine  15 mL Mouth Rinse BID  . enoxaparin (LOVENOX) injection  30 mg Subcutaneous Q24H  . hydrALAZINE  10 mg Intravenous Q6H  . latanoprost  1 drop Both Eyes QHS  . metoprolol  5 mg Intravenous 4 times per day  . piperacillin-tazobactam (ZOSYN)  IV  3.375 g Intravenous 3 times per day  Assessment/Plan: s/p Procedure(s): Diagnostic laparoscopy with placement of drains  POD #2. Diagnostic laparoscopy with placement of drains. No abnormal intra-abdominal findings. Start clear liquids today  Pneumoperitoneum of uncertain etiology. This may be a benign retroperitoneal pneumoperitoneum or may be a walled off perforation. No evidence of obstruction, ischemia, bowel wall thickening, extravasation, or extraluminal fluid by CT and laparoscopy. Continue antibiotics Nothing by mouth  if stabilizes, repeat CT scan in a few days.  Leukocytosis. Pulmonary vs. Abdominal source. Zosyn.  Numerous comorbidities, including dementia, aspiration pneumonia, leukocytosis, acute on chronic renal failure, atrial fibrillation, hypertension, chronic diastolic congestive heart failure  @PROBHOSP @  LOS: 7 days    Kristi [name] M 11/22/2014  . .prob

## 2014-11-23 ENCOUNTER — Inpatient Hospital Stay (HOSPITAL_COMMUNITY): Payer: Medicare Other

## 2014-11-23 LAB — BASIC METABOLIC PANEL
ANION GAP: 6 (ref 5–15)
BUN: 15 mg/dL (ref 6–23)
CO2: 19 mmol/L (ref 19–32)
CREATININE: 1.23 mg/dL — AB (ref 0.50–1.10)
Calcium: 8.7 mg/dL (ref 8.4–10.5)
Chloride: 120 mEq/L — ABNORMAL HIGH (ref 96–112)
GFR, EST AFRICAN AMERICAN: 46 mL/min — AB (ref >90)
GFR, EST NON AFRICAN AMERICAN: 40 mL/min — AB (ref >90)
Glucose, Bld: 110 mg/dL — ABNORMAL HIGH (ref 70–99)
Potassium: 4.2 mmol/L (ref 3.5–5.1)
Sodium: 145 mmol/L (ref 135–145)

## 2014-11-23 LAB — DIFFERENTIAL
Basophils Absolute: 0 10*3/uL (ref 0.0–0.1)
Basophils Relative: 0 % (ref 0–1)
EOS ABS: 0 10*3/uL (ref 0.0–0.7)
Eosinophils Relative: 0 % (ref 0–5)
LYMPHS ABS: 1.7 10*3/uL (ref 0.7–4.0)
Lymphocytes Relative: 8 % — ABNORMAL LOW (ref 12–46)
MONO ABS: 1.5 10*3/uL — AB (ref 0.1–1.0)
MONOS PCT: 7 % (ref 3–12)
NEUTROS ABS: 18.4 10*3/uL — AB (ref 1.7–7.7)
Neutrophils Relative %: 85 % — ABNORMAL HIGH (ref 43–77)

## 2014-11-23 LAB — CBC
HCT: 29.5 % — ABNORMAL LOW (ref 36.0–46.0)
Hemoglobin: 9.2 g/dL — ABNORMAL LOW (ref 12.0–15.0)
MCH: 30.1 pg (ref 26.0–34.0)
MCHC: 31.2 g/dL (ref 30.0–36.0)
MCV: 96.4 fL (ref 78.0–100.0)
Platelets: 298 10*3/uL (ref 150–400)
RBC: 3.06 MIL/uL — ABNORMAL LOW (ref 3.87–5.11)
RDW: 13.9 % (ref 11.5–15.5)
WBC: 21.6 10*3/uL — ABNORMAL HIGH (ref 4.0–10.5)

## 2014-11-23 MED ORDER — AMLODIPINE BESYLATE 10 MG PO TABS
10.0000 mg | ORAL_TABLET | Freq: Every day | ORAL | Status: DC
  Administered 2014-11-23 – 2014-11-26 (×4): 10 mg via ORAL
  Filled 2014-11-23 (×5): qty 1

## 2014-11-23 MED ORDER — METOPROLOL SUCCINATE ER 25 MG PO TB24
25.0000 mg | ORAL_TABLET | Freq: Every day | ORAL | Status: DC
  Administered 2014-11-23 – 2014-11-25 (×3): 25 mg via ORAL
  Filled 2014-11-23 (×4): qty 1

## 2014-11-23 MED ORDER — FUROSEMIDE 10 MG/ML IJ SOLN
40.0000 mg | Freq: Once | INTRAMUSCULAR | Status: AC
  Administered 2014-11-23: 40 mg via INTRAVENOUS
  Filled 2014-11-23: qty 4

## 2014-11-23 MED ORDER — FUROSEMIDE 10 MG/ML IJ SOLN
40.0000 mg | Freq: Once | INTRAMUSCULAR | Status: AC
  Administered 2014-11-24: 40 mg via INTRAVENOUS
  Filled 2014-11-23: qty 4

## 2014-11-23 NOTE — Progress Notes (Signed)
PT Cancellation Note  Patient Details Name: Kristi [name] MRN: [id number] DOB: [birth year]   Cancelled Treatment:    Reason Eval/Treat Not Completed: Patient declined, no reason specified (pt refused despite max encouragement and explanation of benefits of mobility. )   Ralene BatheUhlenberg, [name] 11/23/2014, 1:15 PM  [phone]

## 2014-11-23 NOTE — Progress Notes (Signed)
3 Days Post-Op  Subjective: She is about the same, some increased respiratory rate.  Sites all look fine .  Multiple stools  Objective: Vital signs in last 24 hours: Temp:  [98.1 F (36.7 C)-99.1 F (37.3 C)] 99.1 F (37.3 C) (12/24 0512) Pulse Rate:  [69-138] 82 (12/24 0512) Resp:  [20] 20 (12/24 0512) BP: (143-192)/(85-117) 188/100 mmHg (12/24 0512) SpO2:  [98 %-99 %] 98 % (12/24 0813) Weight:  [56.79 kg (125 lb 3.2 oz)] 56.79 kg (125 lb 3.2 oz) (12/24 0512) Last BM Date: 11/22/14   Drains 70 from the left and 130 from the right 8 stools recorded Afebrile, hypertensive, wBC is still 21.6K Creatinine is 1.23  C diff was negative on 11/19/14  Intake/Output from previous day: 12/23 0701 - 12/24 0700 In: 1250 [I.V.:1200; IV Piggyback:50] Out: 1250 [Urine:1050; Drains:200] Intake/Output this shift:    General appearance: awake, but no idea what is going on.   Resp: BS down some in bases, few rales GI: soft not tender, few BS, taking clear tray but not eating it.  Port sites are fine.  Drainage from each drain is clear.  Lab Results:   Recent Labs  11/22/14 0415 11/23/14 0418  WBC 23.1* 21.6*  HGB 9.3* 9.2*  HCT 29.5* 29.5*  PLT 327 298    BMET  Recent Labs  11/22/14 0415 11/23/14 0418  NA 138 145  K 3.6 4.2  CL 112 120*  CO2 19 19  GLUCOSE 114* 110*  BUN 14 15  CREATININE 1.28* 1.23*  CALCIUM 8.5 8.7   PT/INR No results for input(s): LABPROT, INR in the last 72 hours.   Recent Labs Lab 11/22/14 0415  AST 29  ALT 26  ALKPHOS 64  BILITOT 0.5  PROT 6.1  ALBUMIN 2.5*     Lipase     Component Value Date/Time   LIPASE 23 11/22/2014 0415     Studies/Results: No results found.  Medications: . amLODipine  10 mg Oral Daily  . antiseptic oral rinse  7 mL Mouth Rinse q12n4p  . budesonide (PULMICORT) nebulizer solution  0.25 mg Nebulization BID  . chlorhexidine  15 mL Mouth Rinse BID  . enoxaparin (LOVENOX) injection  30 mg Subcutaneous Q24H   . furosemide  40 mg Intravenous Once  . hydrALAZINE  20 mg Intravenous Q6H  . latanoprost  1 drop Both Eyes QHS  . metoprolol  5 mg Intravenous 4 times per day  . metoprolol succinate  25 mg Oral Daily  . piperacillin-tazobactam (ZOSYN)  IV  3.375 g Intravenous 3 times per day    Assessment/Plan Pneumoperitoneum of uncertain etiology; Diagnostic laparoscopy of abdomen and pelvis, placement of drains, Negative laparoscopy, 11/20/14, Angelia MouldHaywood M. Derrell LollingIngram, M.D.,  Community acquired Pneumonia/fever Bilateral pleural effusions Hx of AF/CHF/EF 40-45%/AI Pulmonary hypertension Hypertension Chronic renal disease Stage IV Dementia/Noncompliance/currently resident on SNF  Renal insuffiencey  Ongoing leukocytosis  WBC 21,600 - 11/23/2014 Anemia - Hgb - 9.2 - 11/23/2014  Plan;  Still no good explanation for WBC, At this point I would recheck CXR, and will check on repeating CT scan.  Creatinine is actually better than on admit.  They are planning LTAC, but i don't think she is ready at this point.   Will discuss with Dr. Ezzard StandingNewman.   She has had 8 days of Zosyn so we may need to recheck the C diff also.  I would try to advance her diet if we don't find anything, but she needs to be fed.  LOS: 8 days    JENNINGS,WILLARD 11/23/2014  Agree with above. Patient does not communicate. She is on isolation - MRSA Will plan repeat CT scan 11/25/2014 (this will be 5 days post op)  Ovidio Kinavid Bettylou Frew, MD, [facility] Pager: [phone] Office phone:  [phone]

## 2014-11-23 NOTE — Progress Notes (Signed)
OT Note  Patient Details Name: Kristi [name] MRN: [id number] DOB: 1931-12-01   Cancelled Treatment:    Reason Eval/Treat Not Completed: Other (comment)  New OT order received. Please see OT eval on 12/22.  Further OT deferred to SNF Thanks, Lise AuerLori Alexiana Laverdure, [place] [phone]  Einar CrowEDDING, Rees Matura D 11/23/2014, 12:44 PM

## 2014-11-23 NOTE — Progress Notes (Addendum)
CARE MANAGEMENT NOTE 11/23/2014  Patient:  Kristi [name],Kristi [name]   Account Number:  [id number]  Date Initiated:  11/16/2014  Documentation initiated by:  Jiles CrockerHANDLER,BRENDA  Subjective/Objective Assessment:   ADMITTED WITH SIRS     Action/Plan:   [facility]   Anticipated DC Date:  11/21/2014   Anticipated DC Plan:  SKILLED NURSING FACILITY  In-house referral  Clinical Social Worker      DC Planning Services  CM consult      Choice offered to / List presented to:             Status of service:  In process, will continue to follow Medicare Important Message given?   (If response is "NO", the following Medicare IM given date fields will be blank) Date Medicare IM given:   Medicare IM given by:   Date Additional Medicare IM given:   Additional Medicare IM given by:    Discharge Disposition:    Per UR Regulation:  Reviewed for med. necessity/level of care/duration of stay  If discussed at Long Length of Stay Meetings, dates discussed:   11/21/2014  11/23/2014    Comments:  11/23/14 MMcGibboney, RN, BSN Note pt is from an ALF. Chart reviewed. Pt has bed offers at [facility], not ready/[facility] today. [facility]  12/17/2015Abelino Derrick- B CHANDLER RN,BSN,MHA [phone]

## 2014-11-23 NOTE — Progress Notes (Addendum)
Per RNCM, Kristi [name] - patient has a bed at [facility]. [facility] CSW was trying to find SNF, but won't be able to take with JP drains.   Lincoln MaxinKelly Baltasar Twilley, LCSW [facility] Clinical Social Worker cell #: [phone]

## 2014-11-23 NOTE — Progress Notes (Signed)
[facility]                                                                              Patient Demographics  Kristi [name], is a 80 y.o. female, DOB - 25-Nov-1931, JXB:[id number]  Admit date - 11/15/2014   Admitting Physician Ron ParkerHarvette C Jenkins, MD  Outpatient Primary MD for the patient is No primary care provider on file.  LOS - 8   Chief Complaint  Patient presents with  . Shaking        Assessment & Plan   Sepsis secondary to HCAP/aspiration pneumonia vs peritonitis -Sepsis was present at the time of admission -Repeat chest x-ray 11/18/2014 showed RLL consolidation -Influenza PCR negative -Continue Zosyn and IV fluids -Blood cultures negative from 11/15/2014 -Urinalysis also negative  Shortness of breath Could be a combination of right lower lobe pneumonia secondary to aspiration vs volume overload secondary to aggressive IV hydration KVO IV fluids Patient given 1 dose of IV Lasix Will repeat another dose  Pneumoperitoneum -Gen. surgery, Dr. Derrell LollingIngram, consulted and appreciated -Concern for perforated viscus with peritonitis -11/20/2014 case was discussed with Dr. Derrell LollingIngram, diagnostic laparoscopy conducted which did not reveal any conclusive findings of perforation General surgery to repeat CT scan of abdomen pelvis -Continue Zosyn Surgery advancing her diet   Acute on chronic renal failure, stage III -Baseline creatinine appears to be 1.1-1.4 -Resolved -Likely secondary to sepsis and volume depletion -Continue to monitor BMP  Atrial fibrillation -Currently rate controlled -Continue metoprolol IV Restarted by mouth metoprolol  Hypertension Restarted Toprol and Norvasc Patient also started on IV Lasix Continue IV hydralazine when necessary   Dysphagia with history of esophageal stricture -Barium esophagogram was negative for stricture shows esophageal dysmotility  Dementia -Continue Aricept and Namenda  Chronic diastolic heart  failure -Currently appears euvolemic and compensated -Lasix held -Continue to monitor daily weights, intake and output  Hypernatremia -Resolved likely secondary to volume depletion  Normocytic anemia -Hemoglobin appears to be stable, possibly dilutional component -Continue to monitor CBC  Code Status: DO NOT RESUSCITATE  Family Communication: None at bedside  Disposition Plan: Anticipate discharge to LTAC in one to 2 days  Time Spent in minutes   30 minutes  Procedures  Laparoscopy 11/20/2014  Consults   Gen. surgery  DVT Prophylaxis  Lovenox  Lab Results  Component Value Date   PLT 298 11/23/2014    Medications  Scheduled Meds: . amLODipine  10 mg Oral Daily  . antiseptic oral rinse  7 mL Mouth Rinse q12n4p  . budesonide (PULMICORT) nebulizer solution  0.25 mg Nebulization BID  . chlorhexidine  15 mL Mouth Rinse BID  . enoxaparin (LOVENOX) injection  30 mg Subcutaneous Q24H  . hydrALAZINE  20 mg Intravenous Q6H  . latanoprost  1 drop Both Eyes QHS  . metoprolol  5 mg Intravenous 4 times per day  . metoprolol succinate  25 mg Oral Daily  . piperacillin-tazobactam (ZOSYN)  IV  3.375 g Intravenous 3 times per day   Continuous Infusions: . 0.9 % NaCl with KCl 20 mEq / L 10 mL/hr at 11/23/14 1134   PRN Meds:.hydrALAZINE, HYDROmorphone (DILAUDID) injection, ondansetron **OR** ondansetron (ZOFRAN) IV  Antibiotics  Anti-infectives    Start     Dose/Rate Route Frequency Ordered Stop   11/20/14 2200  piperacillin-tazobactam (ZOSYN) IVPB 3.375 g     3.375 g12.5 mL/hr over 240 Minutes Intravenous 3 times per day 11/20/14 1749     11/16/14 2200  vancomycin (VANCOCIN) 500 mg in sodium chloride 0.9 % 100 mL IVPB  Status:  Discontinued     500 mg100 mL/hr over 60 Minutes Intravenous Every 24 hours 11/15/14 1916 11/17/14 1600   11/16/14 0400  piperacillin-tazobactam (ZOSYN) IVPB 3.375 g  Status:  Discontinued     3.375 g12.5 mL/hr over 240 Minutes Intravenous Every 8  hours 11/15/14 1916 11/20/14 1811   11/15/14 1915  vancomycin (VANCOCIN) IVPB 1000 mg/200 mL premix     1,000 mg200 mL/hr over 60 Minutes Intravenous STAT 11/15/14 1909 11/15/14 2320   11/15/14 1915  piperacillin-tazobactam (ZOSYN) IVPB 3.375 g     3.375 g100 mL/hr over 30 Minutes Intravenous STAT 11/15/14 1909 11/15/14 2011        Subjective:   Kristi [name] seen and examined today.   Patient quite tachypneic and hyperventilating. However she denies any shortness of breath or chest pain.  Objective:   Filed Vitals:   11/23/14 0053 11/23/14 0512 11/23/14 0813 11/23/14 1137  BP: 189/90 188/100  197/90  Pulse:  82  68  Temp:  99.1 F (37.3 C)    TempSrc:  Oral    Resp:  20    Height:      Weight:  56.79 kg (125 lb 3.2 oz)    SpO2:  98% 98%     Wt Readings from Last 3 Encounters:  11/23/14 56.79 kg (125 lb 3.2 oz)  12/20/11 50.758 kg (111 lb 14.4 oz)  09/03/11 46.72 kg (103 lb)     Intake/Output Summary (Last 24 hours) at 11/23/14 1159 Last data filed at 11/23/14 1149  Gross per 24 hour  Intake   1250 ml  Output   1315 ml  Net    -65 ml    Exam  General: Well developed, NAD, appears stated age  HEENT: NCAT, mucous membranes moist.   Cardiovascular: S1 S2 auscultated, Regular rate and rhythm.  Respiratory: Clear to auscultation bilaterally with equal chest rise  Abdomen: Soft, nontender, nondistended, + bowel sounds, JP drains in place-serosanguineous fluid  Extremities: warm dry without cyanosis clubbing or edema  Neuro: Awake and alert, oriented to self, no focal deficits, follows commands  Data Review   Micro Results Recent Results (from the past 240 hour(s))  Culture, blood (routine x 2)     Status: None   Collection Time: 11/15/14  6:59 PM  Result Value Ref Range Status   Specimen Description BLOOD RIGHT HAND  Final   Special Requests BOTTLES DRAWN AEROBIC ONLY  Final   Culture  Setup Time   Final    11/16/2014 05:47 Performed at [facility]    Culture   Final    NO GROWTH 5 DAYS Performed at [facility]    Report Status 11/22/2014 FINAL  Final  Culture, blood (routine x 2)     Status: None   Collection Time: 11/15/14  7:07 PM  Result Value Ref Range Status   Specimen Description BLOOD RIGHT FOREARM  Final   Special Requests BOTTLES DRAWN AEROBIC ONLY  Final   Culture  Setup Time   Final    11/16/2014 05:47 Performed at [facility]    Culture   Final  NO GROWTH 5 DAYS Performed at [facility]    Report Status 11/22/2014 FINAL  Final  MRSA PCR Screening     Status: Abnormal   Collection Time: 11/15/14 10:34 PM  Result Value Ref Range Status   MRSA by PCR POSITIVE (A) NEGATIVE Final    Comment:        The GeneXpert MRSA Assay (FDA approved for NASAL specimens only), is one component of a comprehensive MRSA colonization surveillance program. It is not intended to diagnose MRSA infection nor to guide or monitor treatment for MRSA infections. RESULT CALLED TO, READ BACK BY AND VERIFIED WITH: L.HUDSON,RN AT 0219 ON 11/16/14 BY [email or web address]   Clostridium Difficile by PCR     Status: None   Collection Time: 11/17/14 12:18 AM  Result Value Ref Range Status   C difficile by pcr NEGATIVE NEGATIVE Final    Comment: Performed at [facility]  Clostridium Difficile by PCR     Status: None   Collection Time: 11/19/14  6:59 PM  Result Value Ref Range Status   C difficile by pcr NEGATIVE NEGATIVE Final    Comment: Performed at [facility]  Surgical pcr screen     Status: Abnormal   Collection Time: 11/20/14  1:32 PM  Result Value Ref Range Status   MRSA, PCR POSITIVE (A) NEGATIVE Final    Comment: RESULT CALLED TO, READ BACK BY AND VERIFIED WITH: Reymundo Poll RN 1554 11/20/14 [place] V    Staphylococcus aureus POSITIVE (A) NEGATIVE Final    Comment:        The Xpert SA Assay (FDA approved for NASAL specimens in patients over 88 years of age), is one  component of a comprehensive surveillance program.  Test performance has been validated by [employer] for patients greater than or equal to 28 year old. It is not intended to diagnose infection nor to guide or monitor treatment.     Radiology Reports Ct Abdomen Pelvis Wo Contrast  11/19/2014   CLINICAL DATA:  Fever and chills. Leukocytosis with presumed aspiration pneumonia. History of coronary artery disease, dementia and renal insufficiency. Initial encounter.  EXAM: CT ABDOMEN AND PELVIS WITHOUT CONTRAST  TECHNIQUE: Multidetector CT imaging of the abdomen and pelvis was performed following the standard protocol without IV contrast.  COMPARISON:  Chest and abdominal radiographs 11/18/2014. Report from abdominal CT 01/29/2001.  FINDINGS: Lower chest: There are small to moderate dependent pleural effusions bilaterally with associated dependent bibasilar airspace opacities. These are most consistent with atelectasis. There is no consolidation or significant pericardial effusion. Extensive atherosclerosis of the coronary arteries and thoracic aorta noted. The heart is enlarged.  Hepatobiliary: The liver demonstrates decreased density consistent with steatosis. No focal lesions are identified on non contrast imaging. The gallbladder is surgically absent. There is air within the extrahepatic and left intrahepatic biliary system.  Pancreas:  Unremarkable on noncontrast imaging.  Spleen: Normal in size without focal abnormality.  Adrenals/Urinary Tract: Both adrenal glands are somewhat indistinct without focal mass or hematoma.The left kidney demonstrates asymmetric cortical thinning and atrophy. There is some extraluminal gas around the left renal hilum, but no definite air within its collecting system. There is no evidence of hydronephrosis or ureteral calculus. The bladder appears unremarkable.  Stomach/Bowel: Contrast from recent esophagram is present within the colon. There is no colonic wall  thickening or extravasated enteric contrast. Diverticular changes are present throughout the colon. There is no definite bowel wall thickening or focal surrounding inflammation. However, multiple extraluminal air  bubbles are present. These are most pronounced within the right upper quadrant, inferior to the cholecystectomy bed. Extraluminal air tracks inferiorly along the retroperitoneum. There are several extraluminal air bubbles within the pelvis.There is no free intraperitoneal air or drainable fluid collection.  Vascular/Lymphatic: There are no enlarged abdominal or pelvic lymph nodes. Aortoiliac atherosclerosis noted.  Reproductive: There are vascular calcifications within the uterus. No evidence of adnexal mass.  Other: There is generalized soft tissue edema affecting the subcutaneous and mesenteric fat. No abdominal wall hernia identified.  Musculoskeletal: No acute or significant osseous findings. There are degenerative changes throughout the spine with a grade 1 anterolisthesis at the lower 3 lumbar disc space levels.  IMPRESSION: 1. Multiple extraluminal air collections are present within the abdomen and pelvis, primarily within the retroperitoneum, consistent with a perforated viscus. Because most of the air is in the right upper quadrant, this could be due to a perforated duodenal ulcer. Alternatively, findings could be secondary to perforated sigmoid diverticulitis. There is no free air or drainable fluid collection. 2. Pneumobilia, probably related to previous sphincterotomy or biliary bypass. 3. Bilateral pleural effusions with dependent opacities at both lung bases. No definite aspiration pneumonia demonstrated by this examination. 4. These results were called by telephone at the time of interpretation on 11/19/2014 at 7:50 pm to the patient's nurse, Tinnie Gens, who verbally acknowledged these results.   Electronically Signed   By: Roxy Horseman M.D.   On: 11/19/2014 19:53   Dg Chest 2  View  11/18/2014   CLINICAL DATA:  Shortness of breath  EXAM: CHEST  2 VIEW  COMPARISON:  Chest radiograph 11/15/2014  FINDINGS: Stable enlarged cardiac and mediastinal contours with tortuosity and calcification of thoracic aorta. Interval development of focal heterogeneous opacity within the right lung base. Thoracic spine degenerative change.  IMPRESSION: New focal consolidative opacity within the peripheral right lower lobe, concerning for infection in the appropriate clinical setting. Atelectasis would be an alternative consideration. Recommend short term radiographic followup to ensure resolution.   Electronically Signed   By: Annia Belt M.D.   On: 11/18/2014 12:11   Dg Chest 2 View  11/15/2014   CLINICAL DATA:  80 year old female with abnormal shaking, chills. Initial encounter.  EXAM: CHEST  2 VIEW  COMPARISON:  12/17/2011.  FINDINGS: Low lung volumes. Chronic cardiomegaly. Stable cardiac size and mediastinal contours. No pneumothorax or pulmonary edema. No pleural effusion or consolidation. Eventration of the diaphragm re - identified. No acute osseous abnormality identified. Stable cholecystectomy clips.  IMPRESSION: Low lung volumes, otherwise no acute cardiopulmonary abnormality.   Electronically Signed   By: Augusto Gamble M.D.   On: 11/15/2014 16:35   Dg Esophagus  11/18/2014   CLINICAL DATA:  PT C/O DYSPHAGIA, N/V W/MEALS, FAMILY REPORTS H/O ESOPHAGEAL STRICTURE WHICH REQUIRED DILATION "A LONG TIME AGO"  EXAM: ESOPHOGRAM/BARIUM SWALLOW  TECHNIQUE: Single contrast examination was performed using  thin barium.  FLUOROSCOPY TIME:  1 min and 34 seconds  COMPARISON:  None.  FINDINGS: Pharyngeal swallowing function is grossly normal with no aspiration visualized.  Esophagus is normal in course. There is a small sliding hiatal hernia. There is no convincing stricture. No esophageal mass and no evidence of esophagitis. Study was somewhat limited by patient's probability to only take small swallows.   There is esophageal dysmotility with significant tertiary contractions and barium stasis. No reflux was documented during the exam.  IMPRESSION: 1. Esophageal dysmotility. 2. No convincing mass or stricture. 3. Small hiatal hernia.   Electronically Signed  By: Amie Portlandavid  Ormond M.D.   On: 11/18/2014 12:39   Dg Chest Port 1 View  11/23/2014   CLINICAL DATA:  CHF.  Chest pain  EXAM: PORTABLE CHEST - 1 VIEW  COMPARISON:  11/18/2014  FINDINGS: There is moderate cardiac enlargement. Atherosclerotic plaque is noted within the aortic arch. There is a small left pleural effusion. Increase in interstitial edema. Multifocal areas of subsegmental atelectasis noted bilaterally.  IMPRESSION: 1. Increase in CHF pattern.   Electronically Signed   By: Signa Kellaylor  Stroud M.D.   On: 11/23/2014 10:43   Dg Abd Portable 1v  11/18/2014   CLINICAL DATA:  Evaluate for clearance of barium from esophagram. Patient scheduled for CT.  EXAM: PORTABLE ABDOMEN - 1 VIEW  COMPARISON:  Earlier same day  FINDINGS: Residual barium contrast material is demonstrated throughout the colon to the level of the rectum. Relative paucity of small bowel gas. Right upper quadrant surgical clips. Lumbar spine degenerative change.  IMPRESSION: Residual barium is demonstrated throughout the colon to the level of the rectum.   Electronically Signed   By: Annia Beltrew  Davis M.D.   On: 11/18/2014 17:32    CBC  Recent Labs Lab 11/19/14 0425 11/20/14 0402 11/21/14 0452 11/22/14 0415 11/23/14 0418  WBC 25.8* 22.6* 26.1* 23.1* 21.6*  HGB 9.0* 8.9* 9.6* 9.3* 9.2*  HCT 28.4* 27.6* 30.5* 29.5* 29.5*  PLT 267 278 275 327 298  MCV 96.6 96.5 97.4 97.4 96.4  MCH 30.6 31.1 30.7 30.7 30.1  MCHC 31.7 32.2 31.5 31.5 31.2  RDW 13.3 13.5 13.7 13.9 13.9  LYMPHSABS  --  1.6  --   --  1.7  MONOABS  --  1.1*  --   --  1.5*  EOSABS  --  0.0  --   --  0.0  BASOSABS  --  0.0  --   --  0.0    Chemistries   Recent Labs Lab 11/17/14 0417 11/19/14 0425 11/20/14 0402  11/21/14 0452 11/22/14 0415 11/23/14 0418  NA 144 147 146 145 138 145  K 3.6* 3.3* 3.1* 3.7 3.6 4.2  CL 108 111 112 113* 112 120*  CO2 22 18* 20 23 19 19   GLUCOSE 108* 111* 128* 93 114* 110*  BUN 28* 22 18 15 14 15   CREATININE 1.64* 1.48* 1.39* 1.36* 1.28* 1.23*  CALCIUM 8.9 9.5 9.4 9.0 8.5 8.7  MG 2.3  --  2.0  --   --   --   AST  --   --   --   --  29  --   ALT  --   --   --   --  26  --   ALKPHOS  --   --   --   --  64  --   BILITOT  --   --   --   --  0.5  --    ------------------------------------------------------------------------------------------------------------------ estimated creatinine clearance is 28.3 mL/min (by C-G formula based on Cr of 1.23). ------------------------------------------------------------------------------------------------------------------ No results for input(s): HGBA1C in the last 72 hours. ------------------------------------------------------------------------------------------------------------------ No results for input(s): CHOL, HDL, LDLCALC, TRIG, CHOLHDL, LDLDIRECT in the last 72 hours. ------------------------------------------------------------------------------------------------------------------ No results for input(s): TSH, T4TOTAL, T3FREE, THYROIDAB in the last 72 hours.  Invalid input(s): FREET3 ------------------------------------------------------------------------------------------------------------------ No results for input(s): VITAMINB12, FOLATE, FERRITIN, TIBC, IRON, RETICCTPCT in the last 72 hours.  Coagulation profile No results for input(s): INR, PROTIME in the last 168 hours.  No results for input(s): DDIMER in the last 72 hours.  Cardiac  Enzymes No results for input(s): CKMB, TROPONINI, MYOGLOBIN in the last 168 hours.  Invalid input(s): CK ------------------------------------------------------------------------------------------------------------------ Invalid input(s): POCBNP    Berenize Gatlin D.O. on  11/23/2014 at 11:59 AM  Between 7am to 7pm - Pager - [phone]  After 7pm go to [email or web address] - password [email or web address]  And look for the night coverage person covering for me after hours  [facility] Office  [phone]

## 2014-11-24 LAB — COMPREHENSIVE METABOLIC PANEL
ALT: 23 U/L (ref 0–35)
AST: 21 U/L (ref 0–37)
Albumin: 2.8 g/dL — ABNORMAL LOW (ref 3.5–5.2)
Alkaline Phosphatase: 64 U/L (ref 39–117)
Anion gap: 13 (ref 5–15)
BILIRUBIN TOTAL: 0.5 mg/dL (ref 0.3–1.2)
BUN: 15 mg/dL (ref 6–23)
CHLORIDE: 105 meq/L (ref 96–112)
CO2: 21 mmol/L (ref 19–32)
CREATININE: 1.46 mg/dL — AB (ref 0.50–1.10)
Calcium: 8.6 mg/dL (ref 8.4–10.5)
GFR calc Af Amer: 38 mL/min — ABNORMAL LOW (ref >90)
GFR calc non Af Amer: 33 mL/min — ABNORMAL LOW (ref >90)
Glucose, Bld: 87 mg/dL (ref 70–99)
POTASSIUM: 2.9 mmol/L — AB (ref 3.5–5.1)
Sodium: 139 mmol/L (ref 135–145)
Total Protein: 6.4 g/dL (ref 6.0–8.3)

## 2014-11-24 LAB — CBC
HCT: 35.6 % — ABNORMAL LOW (ref 36.0–46.0)
Hemoglobin: 11.3 g/dL — ABNORMAL LOW (ref 12.0–15.0)
MCH: 30.5 pg (ref 26.0–34.0)
MCHC: 31.7 g/dL (ref 30.0–36.0)
MCV: 96.2 fL (ref 78.0–100.0)
PLATELETS: 357 10*3/uL (ref 150–400)
RBC: 3.7 MIL/uL — AB (ref 3.87–5.11)
RDW: 13.9 % (ref 11.5–15.5)
WBC: 25.5 10*3/uL — AB (ref 4.0–10.5)

## 2014-11-24 MED ORDER — CLONIDINE HCL 0.3 MG/24HR TD PTWK
0.3000 mg | MEDICATED_PATCH | TRANSDERMAL | Status: DC
  Administered 2014-11-24: 0.3 mg via TRANSDERMAL
  Filled 2014-11-24: qty 1

## 2014-11-24 MED ORDER — POTASSIUM CHLORIDE CRYS ER 20 MEQ PO TBCR
40.0000 meq | EXTENDED_RELEASE_TABLET | Freq: Once | ORAL | Status: AC
  Administered 2014-11-24: 40 meq via ORAL
  Filled 2014-11-24: qty 2

## 2014-11-24 MED ORDER — SODIUM CHLORIDE 0.9 % IV SOLN
INTRAVENOUS | Status: DC
  Administered 2014-11-24 – 2014-11-27 (×3): via INTRAVENOUS
  Filled 2014-11-24 (×8): qty 1000

## 2014-11-24 MED ORDER — POTASSIUM CHLORIDE 10 MEQ/100ML IV SOLN
10.0000 meq | INTRAVENOUS | Status: DC
  Filled 2014-11-24 (×4): qty 100

## 2014-11-24 MED ORDER — HYDRALAZINE HCL 50 MG PO TABS
50.0000 mg | ORAL_TABLET | Freq: Three times a day (TID) | ORAL | Status: DC
  Administered 2014-11-24 (×2): 50 mg via ORAL
  Filled 2014-11-24 (×6): qty 1

## 2014-11-24 MED ORDER — POTASSIUM CHLORIDE CRYS ER 20 MEQ PO TBCR
40.0000 meq | EXTENDED_RELEASE_TABLET | Freq: Three times a day (TID) | ORAL | Status: AC
  Administered 2014-11-24 (×3): 40 meq via ORAL
  Filled 2014-11-24 (×3): qty 2

## 2014-11-24 MED ORDER — POTASSIUM CHLORIDE 10 MEQ/100ML IV SOLN
10.0000 meq | INTRAVENOUS | Status: DC
  Administered 2014-11-24 (×2): 10 meq via INTRAVENOUS
  Filled 2014-11-24 (×6): qty 100

## 2014-11-24 NOTE — Progress Notes (Signed)
[facility]                                                                              Patient Demographics  Kristi [name], is a 80 y.o. female, DOB - 1931/02/17, ZOX:[id number]  Admit date - 11/15/2014   Admitting Physician Ron ParkerHarvette C Jenkins, MD  Outpatient Primary MD for the patient is No primary care provider on file.  LOS - 9   Chief Complaint  Patient presents with  . Shaking        Assessment & Plan   Sepsis secondary to HCAP/aspiration pneumonia vs peritonitis -Sepsis was present at the time of admission -Repeat chest x-ray 11/18/2014 showed RLL consolidation -Influenza PCR negative -Continue Zosyn and IV fluids -Blood cultures negative from 11/15/2014 -Urinalysis also negative  Shortness of breath Could be a combination of right lower lobe pneumonia secondary to aspiration vs volume overload secondary to aggressive IV hydration We have discontinued IV fluids Patient more comfortable after receiving 2 doses of IV Lasix She had a good response to it   Pneumoperitoneum -Gen. surgery, Dr. Derrell LollingIngram, consulted and appreciated -Concern for perforated viscus with peritonitis -11/20/2014 case was discussed with Dr. Derrell LollingIngram, diagnostic laparoscopy conducted which did not reveal any conclusive findings of perforation General surgery to repeat CT scan of abdomen pelvis tomorrow -Continue Zosyn Surgery advancing her diet   Acute on chronic renal failure, stage III -Baseline creatinine appears to be 1.1-1.4 -Resolved -Likely secondary to sepsis and volume depletion -Continue to monitor BMP  Atrial fibrillation -Currently rate controlled -Continue metoprolol IV Restarted by mouth metoprolol  Hypertension Restarted Toprol and Norvasc Patient also started on IV Lasix Continue IV hydralazine when necessary   Dysphagia with history of esophageal stricture -Barium esophagogram was negative for stricture shows esophageal dysmotility  Dementia -Continue  Aricept and Namenda  Chronic diastolic heart failure -Currently appears euvolemic and compensated -Lasix held -Continue to monitor daily weights, intake and output  Hypernatremia/hypokalemia -Resolved likely secondary to volume depletion Replete potassium orally  Normocytic anemia -Hemoglobin appears to be stable, hemoglobin 11.3 -Continue to monitor CBC  Code Status: DO NOT RESUSCITATE  Family Communication: None at bedside  Disposition Plan: Anticipate discharge to LTAC in one to 2 days  Time Spent in minutes   30 minutes  Procedures  Laparoscopy 11/20/2014  Consults   Gen. surgery  DVT Prophylaxis  Lovenox  Lab Results  Component Value Date   PLT 357 11/24/2014    Medications  Scheduled Meds: . amLODipine  10 mg Oral Daily  . antiseptic oral rinse  7 mL Mouth Rinse q12n4p  . budesonide (PULMICORT) nebulizer solution  0.25 mg Nebulization BID  . chlorhexidine  15 mL Mouth Rinse BID  . cloNIDine  0.3 mg Transdermal Weekly  . enoxaparin (LOVENOX) injection  30 mg Subcutaneous Q24H  . hydrALAZINE  20 mg Intravenous Q6H  . hydrALAZINE  50 mg Oral 3 times per day  . latanoprost  1 drop Both Eyes QHS  . metoprolol  5 mg Intravenous 4 times per day  . metoprolol succinate  25 mg Oral Daily  . piperacillin-tazobactam (ZOSYN)  IV  3.375 g Intravenous 3 times per day  . potassium chloride  10 mEq Intravenous Q1 Hr x 6  . potassium chloride  40 mEq Oral Once   Continuous Infusions: . 0.9 % sodium chloride with kcl     PRN Meds:.hydrALAZINE, HYDROmorphone (DILAUDID) injection, ondansetron **OR** ondansetron (ZOFRAN) IV  Antibiotics    Anti-infectives    Start     Dose/Rate Route Frequency Ordered Stop   11/20/14 2200  piperacillin-tazobactam (ZOSYN) IVPB 3.375 g     3.375 g12.5 mL/hr over 240 Minutes Intravenous 3 times per day 11/20/14 1749     11/16/14 2200  vancomycin (VANCOCIN) 500 mg in sodium chloride 0.9 % 100 mL IVPB  Status:  Discontinued     500 mg100  mL/hr over 60 Minutes Intravenous Every 24 hours 11/15/14 1916 11/17/14 1600   11/16/14 0400  piperacillin-tazobactam (ZOSYN) IVPB 3.375 g  Status:  Discontinued     3.375 g12.5 mL/hr over 240 Minutes Intravenous Every 8 hours 11/15/14 1916 11/20/14 1811   11/15/14 1915  vancomycin (VANCOCIN) IVPB 1000 mg/200 mL premix     1,000 mg200 mL/hr over 60 Minutes Intravenous STAT 11/15/14 1909 11/15/14 2320   11/15/14 1915  piperacillin-tazobactam (ZOSYN) IVPB 3.375 g     3.375 g100 mL/hr over 30 Minutes Intravenous STAT 11/15/14 1909 11/15/14 2011        Subjective:   Kristi [name] seen and examined today.   "I just do not feel well", cannot specify she is nauseous, denies any abdominal pain   Objective:   Filed Vitals:   11/24/14 0300 11/24/14 0500 11/24/14 0618 11/24/14 0850  BP: 153/87  165/106   Pulse:   110   Temp:   97.4 F (36.3 C)   TempSrc:   Axillary   Resp:   20   Height:      Weight:  51.5 kg (113 lb 8.6 oz)    SpO2:   100% 97%    Wt Readings from Last 3 Encounters:  11/24/14 51.5 kg (113 lb 8.6 oz)  12/20/11 50.758 kg (111 lb 14.4 oz)  09/03/11 46.72 kg (103 lb)     Intake/Output Summary (Last 24 hours) at 11/24/14 1139 Last data filed at 11/24/14 0900  Gross per 24 hour  Intake 1994.33 ml  Output   4980 ml  Net -2985.67 ml    Exam  General: Well developed, NAD, appears stated age  HEENT: NCAT, mucous membranes moist.   Cardiovascular: S1 S2 auscultated, Regular rate and rhythm.  Respiratory: Clear to auscultation bilaterally with equal chest rise  Abdomen: Soft, nontender, nondistended, + bowel sounds, JP drains in place-serosanguineous fluid  Extremities: warm dry without cyanosis clubbing or edema  Neuro: Awake and alert, oriented to self, no focal deficits, follows commands  Data Review   Micro Results Recent Results (from the past 240 hour(s))  Culture, blood (routine x 2)     Status: None   Collection Time: 11/15/14  6:59 PM  Result  Value Ref Range Status   Specimen Description BLOOD RIGHT HAND  Final   Special Requests BOTTLES DRAWN AEROBIC ONLY  Final   Culture  Setup Time   Final    11/16/2014 05:47 Performed at [facility]    Culture   Final    NO GROWTH 5 DAYS Performed at [facility]    Report Status 11/22/2014 FINAL  Final  Culture, blood (routine x 2)     Status: None   Collection Time: 11/15/14  7:07 PM  Result Value Ref Range Status   Specimen Description  BLOOD RIGHT FOREARM  Final   Special Requests BOTTLES DRAWN AEROBIC ONLY  Final   Culture  Setup Time   Final    11/16/2014 05:47 Performed at [facility]    Culture   Final    NO GROWTH 5 DAYS Performed at [facility]    Report Status 11/22/2014 FINAL  Final  MRSA PCR Screening     Status: Abnormal   Collection Time: 11/15/14 10:34 PM  Result Value Ref Range Status   MRSA by PCR POSITIVE (A) NEGATIVE Final    Comment:        The GeneXpert MRSA Assay (FDA approved for NASAL specimens only), is one component of a comprehensive MRSA colonization surveillance program. It is not intended to diagnose MRSA infection nor to guide or monitor treatment for MRSA infections. RESULT CALLED TO, READ BACK BY AND VERIFIED WITH: L.HUDSON,RN AT 0219 ON 11/16/14 BY [email or web address]   Clostridium Difficile by PCR     Status: None   Collection Time: 11/17/14 12:18 AM  Result Value Ref Range Status   C difficile by pcr NEGATIVE NEGATIVE Final    Comment: Performed at [facility]  Clostridium Difficile by PCR     Status: None   Collection Time: 11/19/14  6:59 PM  Result Value Ref Range Status   C difficile by pcr NEGATIVE NEGATIVE Final    Comment: Performed at [facility]  Surgical pcr screen     Status: Abnormal   Collection Time: 11/20/14  1:32 PM  Result Value Ref Range Status   MRSA, PCR POSITIVE (A) NEGATIVE Final    Comment: RESULT CALLED TO, READ BACK BY AND VERIFIED WITH: Reymundo Poll RN  1554 11/20/14 COL[place]TE V    Staphylococcus aureus POSITIVE (A) NEGATIVE Final    Comment:        The Xpert SA Assay (FDA approved for NASAL specimens in patients over 25 years of age), is one component of a comprehensive surveillance program.  Test performance has been validated by [employer] for patients greater than or equal to 83 year old. It is not intended to diagnose infection nor to guide or monitor treatment.     Radiology Reports Ct Abdomen Pelvis Wo Contrast  11/19/2014   CLINICAL DATA:  Fever and chills. Leukocytosis with presumed aspiration pneumonia. History of coronary artery disease, dementia and renal insufficiency. Initial encounter.  EXAM: CT ABDOMEN AND PELVIS WITHOUT CONTRAST  TECHNIQUE: Multidetector CT imaging of the abdomen and pelvis was performed following the standard protocol without IV contrast.  COMPARISON:  Chest and abdominal radiographs 11/18/2014. Report from abdominal CT 01/29/2001.  FINDINGS: Lower chest: There are small to moderate dependent pleural effusions bilaterally with associated dependent bibasilar airspace opacities. These are most consistent with atelectasis. There is no consolidation or significant pericardial effusion. Extensive atherosclerosis of the coronary arteries and thoracic aorta noted. The heart is enlarged.  Hepatobiliary: The liver demonstrates decreased density consistent with steatosis. No focal lesions are identified on non contrast imaging. The gallbladder is surgically absent. There is air within the extrahepatic and left intrahepatic biliary system.  Pancreas:  Unremarkable on noncontrast imaging.  Spleen: Normal in size without focal abnormality.  Adrenals/Urinary Tract: Both adrenal glands are somewhat indistinct without focal mass or hematoma.The left kidney demonstrates asymmetric cortical thinning and atrophy. There is some extraluminal gas around the left renal hilum, but no definite air within its collecting system. There  is no evidence of hydronephrosis or ureteral calculus. The bladder  appears unremarkable.  Stomach/Bowel: Contrast from recent esophagram is present within the colon. There is no colonic wall thickening or extravasated enteric contrast. Diverticular changes are present throughout the colon. There is no definite bowel wall thickening or focal surrounding inflammation. However, multiple extraluminal air bubbles are present. These are most pronounced within the right upper quadrant, inferior to the cholecystectomy bed. Extraluminal air tracks inferiorly along the retroperitoneum. There are several extraluminal air bubbles within the pelvis.There is no free intraperitoneal air or drainable fluid collection.  Vascular/Lymphatic: There are no enlarged abdominal or pelvic lymph nodes. Aortoiliac atherosclerosis noted.  Reproductive: There are vascular calcifications within the uterus. No evidence of adnexal mass.  Other: There is generalized soft tissue edema affecting the subcutaneous and mesenteric fat. No abdominal wall hernia identified.  Musculoskeletal: No acute or significant osseous findings. There are degenerative changes throughout the spine with a grade 1 anterolisthesis at the lower 3 lumbar disc space levels.  IMPRESSION: 1. Multiple extraluminal air collections are present within the abdomen and pelvis, primarily within the retroperitoneum, consistent with a perforated viscus. Because most of the air is in the right upper quadrant, this could be due to a perforated duodenal ulcer. Alternatively, findings could be secondary to perforated sigmoid diverticulitis. There is no free air or drainable fluid collection. 2. Pneumobilia, probably related to previous sphincterotomy or biliary bypass. 3. Bilateral pleural effusions with dependent opacities at both lung bases. No definite aspiration pneumonia demonstrated by this examination. 4. These results were called by telephone at the time of interpretation on  11/19/2014 at 7:50 pm to the patient's nurse, Tinnie Gens, who verbally acknowledged these results.   Electronically Signed   By: Roxy Horseman M.D.   On: 11/19/2014 19:53   Dg Chest 2 View  11/18/2014   CLINICAL DATA:  Shortness of breath  EXAM: CHEST  2 VIEW  COMPARISON:  Chest radiograph 11/15/2014  FINDINGS: Stable enlarged cardiac and mediastinal contours with tortuosity and calcification of thoracic aorta. Interval development of focal heterogeneous opacity within the right lung base. Thoracic spine degenerative change.  IMPRESSION: New focal consolidative opacity within the peripheral right lower lobe, concerning for infection in the appropriate clinical setting. Atelectasis would be an alternative consideration. Recommend short term radiographic followup to ensure resolution.   Electronically Signed   By: Annia Belt M.D.   On: 11/18/2014 12:11   Dg Chest 2 View  11/15/2014   CLINICAL DATA:  80 year old female with abnormal shaking, chills. Initial encounter.  EXAM: CHEST  2 VIEW  COMPARISON:  12/17/2011.  FINDINGS: Low lung volumes. Chronic cardiomegaly. Stable cardiac size and mediastinal contours. No pneumothorax or pulmonary edema. No pleural effusion or consolidation. Eventration of the diaphragm re - identified. No acute osseous abnormality identified. Stable cholecystectomy clips.  IMPRESSION: Low lung volumes, otherwise no acute cardiopulmonary abnormality.   Electronically Signed   By: Augusto Gamble M.D.   On: 11/15/2014 16:35   Dg Esophagus  11/18/2014   CLINICAL DATA:  PT C/O DYSPHAGIA, N/V W/MEALS, FAMILY REPORTS H/O ESOPHAGEAL STRICTURE WHICH REQUIRED DILATION "A LONG TIME AGO"  EXAM: ESOPHOGRAM/BARIUM SWALLOW  TECHNIQUE: Single contrast examination was performed using  thin barium.  FLUOROSCOPY TIME:  1 min and 34 seconds  COMPARISON:  None.  FINDINGS: Pharyngeal swallowing function is grossly normal with no aspiration visualized.  Esophagus is normal in course. There is a small sliding  hiatal hernia. There is no convincing stricture. No esophageal mass and no evidence of esophagitis. Study was somewhat limited by patient's  probability to only take small swallows.  There is esophageal dysmotility with significant tertiary contractions and barium stasis. No reflux was documented during the exam.  IMPRESSION: 1. Esophageal dysmotility. 2. No convincing mass or stricture. 3. Small hiatal hernia.   Electronically Signed   By: Amie Portland M.D.   On: 11/18/2014 12:39   Dg Chest Port 1 View  11/23/2014   CLINICAL DATA:  CHF.  Chest pain  EXAM: PORTABLE CHEST - 1 VIEW  COMPARISON:  11/18/2014  FINDINGS: There is moderate cardiac enlargement. Atherosclerotic plaque is noted within the aortic arch. There is a small left pleural effusion. Increase in interstitial edema. Multifocal areas of subsegmental atelectasis noted bilaterally.  IMPRESSION: 1. Increase in CHF pattern.   Electronically Signed   By: Signa Kell M.D.   On: 11/23/2014 10:43   Dg Abd Portable 1v  11/18/2014   CLINICAL DATA:  Evaluate for clearance of barium from esophagram. Patient scheduled for CT.  EXAM: PORTABLE ABDOMEN - 1 VIEW  COMPARISON:  Earlier same day  FINDINGS: Residual barium contrast material is demonstrated throughout the colon to the level of the rectum. Relative paucity of small bowel gas. Right upper quadrant surgical clips. Lumbar spine degenerative change.  IMPRESSION: Residual barium is demonstrated throughout the colon to the level of the rectum.   Electronically Signed   By: Annia Belt M.D.   On: 11/18/2014 17:32    CBC  Recent Labs Lab 11/20/14 0402 11/21/14 0452 11/22/14 0415 11/23/14 0418 11/24/14 0420  WBC 22.6* 26.1* 23.1* 21.6* 25.5*  HGB 8.9* 9.6* 9.3* 9.2* 11.3*  HCT 27.6* 30.5* 29.5* 29.5* 35.6*  PLT 278 275 327 298 357  MCV 96.5 97.4 97.4 96.4 96.2  MCH 31.1 30.7 30.7 30.1 30.5  MCHC 32.2 31.5 31.5 31.2 31.7  RDW 13.5 13.7 13.9 13.9 13.9  LYMPHSABS 1.6  --   --  1.7  --     MONOABS 1.1*  --   --  1.5*  --   EOSABS 0.0  --   --  0.0  --   BASOSABS 0.0  --   --  0.0  --     Chemistries   Recent Labs Lab 11/20/14 0402 11/21/14 0452 11/22/14 0415 11/23/14 0418 11/24/14 0420  NA 146 145 138 145 139  K 3.1* 3.7 3.6 4.2 2.9*  CL 112 113* 112 120* 105  CO2 20 23 19 19 21   GLUCOSE 128* 93 114* 110* 87  BUN 18 15 14 15 15   CREATININE 1.39* 1.36* 1.28* 1.23* 1.46*  CALCIUM 9.4 9.0 8.5 8.7 8.6  MG 2.0  --   --   --   --   AST  --   --  29  --  21  ALT  --   --  26  --  23  ALKPHOS  --   --  64  --  64  BILITOT  --   --  0.5  --  0.5   ------------------------------------------------------------------------------------------------------------------ estimated creatinine clearance is 21.7 mL/min (by C-G formula based on Cr of 1.46). ------------------------------------------------------------------------------------------------------------------ No results for input(s): HGBA1C in the last 72 hours. ------------------------------------------------------------------------------------------------------------------ No results for input(s): CHOL, HDL, LDLCALC, TRIG, CHOLHDL, LDLDIRECT in the last 72 hours. ------------------------------------------------------------------------------------------------------------------ No results for input(s): TSH, T4TOTAL, T3FREE, THYROIDAB in the last 72 hours.  Invalid input(s): FREET3 ------------------------------------------------------------------------------------------------------------------ No results for input(s): VITAMINB12, FOLATE, FERRITIN, TIBC, IRON, RETICCTPCT in the last 72 hours.  Coagulation profile No results for input(s): INR, PROTIME in the  last 168 hours.  No results for input(s): DDIMER in the last 72 hours.  Cardiac Enzymes No results for input(s): CKMB, TROPONINI, MYOGLOBIN in the last 168 hours.  Invalid input(s):  CK ------------------------------------------------------------------------------------------------------------------ Invalid input(s): POCBNP    Suttyn Cryder D.O. on 11/24/2014 at 11:39 AM  Between 7am to 7pm - Pager - [phone]  After 7pm go to [email or web address] - password [email or web address]  And look for the night coverage person covering for me after hours  [facility] Office  [phone]

## 2014-11-24 NOTE — Progress Notes (Signed)
General Surgery Note  LOS: 9 days  POD -  4 Days Post-Op  Assessment/Plan: 1.  Diagnostic laparoscopy with placement of drains - 11/20/2014 - H. Derrell Lollingngram  Had pneumoperitoneum without clear source.  She has 2 drains which have benign appearing serous fluid  On clear liquids, but not taking much  Patient has no peritoneal signs.  Consider CT abdomen tomorrow (this will be 5 days post op), but she is not presenting as someone with an acute surgical abdomen.  I am at a loss to explain the original CT scan findings.   2.  Community acquired Pneumonia - RLL consolidation  Zosyn 3.  Bilateral pleural effusions 4.  Hx of AF/CHF/EF 40-45%/AI 5.  Pulmonary hypertension 6.  Hypertension 7.  Chronic renal disease Stage IV  Creat - 1.46 - 11/24/2014 8.  Dementia - currently resident on SNF  9.  Ongoing leukocytosis WBC 25,500 - 11/24/2014 10.  Anemia - Hgb - 9.2 - 11/23/2014, but today Hgb at 11.3.  11.  DVT prophylaxis - Lovenox   Principal Problem:   SIRS (systemic inflammatory response syndrome) Active Problems:   Atrial fibrillation   CHF (congestive heart failure)   Hypertension   Dementia   CAD (coronary artery disease)   CKD (chronic kidney disease), stage IV   Leukocytosis   Sepsis   Aspiration pneumonia   Acute on chronic renal failure   Dysphagia   Pneumoperitoneum of unknown etiology   Pneumoperitoneum   Subjective:  She does talk.  She knows her daughters names, but does not communicate much otherwise. Objective:   Filed Vitals:   11/24/14 0618  BP: 165/106  Pulse: 110  Temp: 97.4 F (36.3 C)  Resp: 20     Intake/Output from previous day:  12/24 0701 - 12/25 0700 In: 1994.3 [I.V.:1844.3; IV Piggyback:150] Out: 4980 [Urine:4750; Drains:230]  Intake/Output this shift:      Physical Exam:   General: Older AA F who is alert.   HEENT: Normal. Pupils equal. .   Lungs: Poor inspiratory effort.   Abdomen: Soft.  Rare BS.  She has a right sided  drain and a left sided drain.  Left/right - 155/75 cc last 24 hours.   Lab Results:    Recent Labs  11/23/14 0418 11/24/14 0420  WBC 21.6* 25.5*  HGB 9.2* 11.3*  HCT 29.5* 35.6*  PLT 298 357    BMET   Recent Labs  11/23/14 0418 11/24/14 0420  NA 145 139  K 4.2 2.9*  CL 120* 105  CO2 19 21  GLUCOSE 110* 87  BUN 15 15  CREATININE 1.23* 1.46*  CALCIUM 8.7 8.6    PT/INR  No results for input(s): LABPROT, INR in the last 72 hours.  ABG  No results for input(s): PHART, HCO3 in the last 72 hours.  Invalid input(s): PCO2, PO2   Studies/Results:  Dg Chest Port 1 View  11/23/2014   CLINICAL DATA:  CHF.  Chest pain  EXAM: PORTABLE CHEST - 1 VIEW  COMPARISON:  11/18/2014  FINDINGS: There is moderate cardiac enlargement. Atherosclerotic plaque is noted within the aortic arch. There is a small left pleural effusion. Increase in interstitial edema. Multifocal areas of subsegmental atelectasis noted bilaterally.  IMPRESSION: 1. Increase in CHF pattern.   Electronically Signed   By: Signa Kellaylor  Stroud M.D.   On: 11/23/2014 10:43     Anti-infectives:   Anti-infectives    Start     Dose/Rate Route Frequency Ordered Stop   11/20/14 2200  piperacillin-tazobactam (ZOSYN) IVPB 3.375 g     3.375 g12.5 mL/hr over 240 Minutes Intravenous 3 times per day 11/20/14 1749     11/16/14 2200  vancomycin (VANCOCIN) 500 mg in sodium chloride 0.9 % 100 mL IVPB  Status:  Discontinued     500 mg100 mL/hr over 60 Minutes Intravenous Every 24 hours 11/15/14 1916 11/17/14 1600   11/16/14 0400  piperacillin-tazobactam (ZOSYN) IVPB 3.375 g  Status:  Discontinued     3.375 g12.5 mL/hr over 240 Minutes Intravenous Every 8 hours 11/15/14 1916 11/20/14 1811   11/15/14 1915  vancomycin (VANCOCIN) IVPB 1000 mg/200 mL premix     1,000 mg200 mL/hr over 60 Minutes Intravenous STAT 11/15/14 1909 11/15/14 2320   11/15/14 1915  piperacillin-tazobactam (ZOSYN) IVPB 3.375 g     3.375 g100 mL/hr over 30 Minutes Intravenous  STAT 11/15/14 1909 11/15/14 2011      Ovidio Kinavid Queena Monrreal, MD, FACS Pager: [phone] [facility] Office: [phone] 11/24/2014

## 2014-11-24 NOTE — Progress Notes (Signed)
ANTIBIOTIC CONSULT NOTE - Follow up  Pharmacy Consult for  Zosyn Indication: aspiration PNA/possible perforation  Allergies  Allergen Reactions  . Atenolol Other (See Comments)    unknown  . Ramipril Cough  . Simvastatin Other (See Comments)    unknown    Patient Measurements: Height: 5' (152.4 cm) Weight: 113 lb 8.6 oz (51.5 kg) IBW/kg (Calculated) : 45.5 Height: 61 inches Weight: 52.7 kg  Vital Signs: Temp: 97.4 F (36.3 C) (12/25 0618) Temp Source: Axillary (12/25 0618) BP: 165/106 mmHg (12/25 0618) Pulse Rate: 110 (12/25 0618) Intake/Output from previous day: 12/24 0701 - 12/25 0700 In: 1994.3 [I.V.:1844.3; IV Piggyback:150] Out: 4980 [Urine:4750; Drains:230] Intake/Output from this shift:    Labs:  Recent Labs  11/22/14 0415 11/23/14 0418 11/24/14 0420  WBC 23.1* 21.6* 25.5*  HGB 9.3* 9.2* 11.3*  PLT 327 298 357  CREATININE 1.28* 1.23* 1.46*   Estimated Creatinine Clearance: 21.7 mL/min (by C-G formula based on Cr of 1.46). No results for input(s): VANCOTROUGH, VANCOPEAK, VANCORANDOM, GENTTROUGH, GENTPEAK, GENTRANDOM, TOBRATROUGH, TOBRAPEAK, TOBRARND, AMIKACINPEAK, AMIKACINTROU, AMIKACIN in the last 72 hours.   Microbiology: Recent Results (from the past 720 hour(s))  Culture, blood (routine x 2)     Status: None   Collection Time: 11/15/14  6:59 PM  Result Value Ref Range Status   Specimen Description BLOOD RIGHT HAND  Final   Special Requests BOTTLES DRAWN AEROBIC ONLY 1ML  Final   Culture  Setup Time   Final    11/16/2014 05:47 Performed at [facility]    Culture   Final    NO GROWTH 5 DAYS Performed at [facility]    Report Status 11/22/2014 FINAL  Final  Culture, blood (routine x 2)     Status: None   Collection Time: 11/15/14  7:07 PM  Result Value Ref Range Status   Specimen Description BLOOD RIGHT FOREARM  Final   Special Requests BOTTLES DRAWN AEROBIC ONLY 2ML  Final   Culture  Setup Time   Final    11/16/2014  05:47 Performed at [facility]    Culture   Final    NO GROWTH 5 DAYS Performed at [facility]    Report Status 11/22/2014 FINAL  Final  MRSA PCR Screening     Status: Abnormal   Collection Time: 11/15/14 10:34 PM  Result Value Ref Range Status   MRSA by PCR POSITIVE (A) NEGATIVE Final    Comment:        The GeneXpert MRSA Assay (FDA approved for NASAL specimens only), is one component of a comprehensive MRSA colonization surveillance program. It is not intended to diagnose MRSA infection nor to guide or monitor treatment for MRSA infections. RESULT CALLED TO, READ BACK BY AND VERIFIED WITH: L.HUDSON,RN AT 0219 ON 11/16/14 BY SHEA.W   Clostridium Difficile by PCR     Status: None   Collection Time: 11/17/14 12:18 AM  Result Value Ref Range Status   C difficile by pcr NEGATIVE NEGATIVE Final    Comment: Performed at [facility]  Clostridium Difficile by PCR     Status: None   Collection Time: 11/19/14  6:59 PM  Result Value Ref Range Status   C difficile by pcr NEGATIVE NEGATIVE Final    Comment: Performed [facility]  Surgical pcr screen     Status: Abnormal   Collection Time: 11/20/14  1:32 PM  Result Value Ref Range Status   MRSA, PCR POSITIVE (A) NEGATIVE Final  Comment: RESULT CALLED TO, READ BACK BY AND VERIFIED WITH: Reymundo PollDUNKELBERGER K RN [id number] 11/20/14 [place] [facility]    Staphylococcus aureus POSITIVE (A) NEGATIVE Final    Comment:        The Xpert SA Assay (FDA approved for NASAL specimens in patients over 921 years of age), is one component of a comprehensive surveillance program.  Test performance has been validated by [facility] for patients greater than or equal to 78 year old. It is not intended to diagnose infection nor to guide or monitor treatment.     Medical History: Past Medical History  Diagnosis Date  . Atrial fibrillation     Rapid response, hospital, September, 2012  . CHF (congestive heart failure)      Combined systolic and diastolic, EF 40-45%, 2012  . Ejection fraction     EF 40-45%, echo, September, 2012  . History of noncompliance with medical treatment   . Hypertension   . Dyslipidemia   . Dementia      ? mild ?  . CAD (coronary artery disease)     PCI to LAD early 1990s  . Degenerative joint disease   . Chest pain     Chest wall pain and costochondritis  . CKD (chronic kidney disease), stage IV   . Edema     Left greater than right lower extremity, No DVT by Doppler, September, 2012  . Aortic insufficiency     Mild, echo, September, 2012  . Mitral regurgitation     Moderate, echo, 2012  . Pulmonary hypertension     46 mmHg, echo, 2012  . Hyperlipidemia   . Bradycardia     October, 2012  . Fatigue     October, 2012    Medications:  Scheduled:  . amLODipine  10 mg Oral Daily  . antiseptic oral rinse  7 mL Mouth Rinse q12n4p  . budesonide (PULMICORT) nebulizer solution  0.25 mg Nebulization BID  . chlorhexidine  15 mL Mouth Rinse BID  . enoxaparin (LOVENOX) injection  30 mg Subcutaneous Q24H  . hydrALAZINE  20 mg Intravenous Q6H  . latanoprost  1 drop Both Eyes QHS  . metoprolol  5 mg Intravenous 4 times per day  . metoprolol succinate  25 mg Oral Daily  . piperacillin-tazobactam (ZOSYN)  IV  3.375 g Intravenous 3 times per day  . potassium chloride  10 mEq Intravenous Q1 Hr x 4   Infusions:  . 0.9 % NaCl with KCl 20 mEq / L 10 mL/hr at 11/23/14 1134   PRN:   Assessment: 80 yo female with dementia presents from nursing facility for shaking and shivering. Pharmacy is consulted to dose vancomycin and Zosyn for r/o sepsis.  12/16 >> Vancomycin >>12/18 12/16 >> Zosyn >>   Tmax: 99.1, afebrile since 12/22 WBC: still elevated, stable around 23k Renal: SCr 1.23, improved. CrCl 22  12/16 blood x2: NGF 12/18 Cdiff: negative 12/20 Cdiff: negative 12/16,21 MRSA PCR Screen: positive   Goal of Therapy:  Zosyn dose appropriate for indication, renal  function   Plan:  Continue Zosyn 3.375gm IV q8h (4hr extended infusions) for CrCl > 20 ml/min Follow renal function / clinical course, LOT   Bernadene Personrew Kristoph Sattler, PharmD Pager: [phone] 11/24/2014, 7:24 AM

## 2014-11-25 ENCOUNTER — Encounter (HOSPITAL_COMMUNITY): Payer: Self-pay | Admitting: Surgery

## 2014-11-25 ENCOUNTER — Inpatient Hospital Stay (HOSPITAL_COMMUNITY): Payer: Medicare Other

## 2014-11-25 LAB — COMPREHENSIVE METABOLIC PANEL
ALT: 16 U/L (ref 0–35)
AST: 16 U/L (ref 0–37)
Albumin: 2.8 g/dL — ABNORMAL LOW (ref 3.5–5.2)
Alkaline Phosphatase: 56 U/L (ref 39–117)
Anion gap: 5 (ref 5–15)
BUN: 13 mg/dL (ref 6–23)
CALCIUM: 8.8 mg/dL (ref 8.4–10.5)
CO2: 23 mmol/L (ref 19–32)
Chloride: 113 mEq/L — ABNORMAL HIGH (ref 96–112)
Creatinine, Ser: 1.5 mg/dL — ABNORMAL HIGH (ref 0.50–1.10)
GFR, EST AFRICAN AMERICAN: 36 mL/min — AB (ref >90)
GFR, EST NON AFRICAN AMERICAN: 31 mL/min — AB (ref >90)
GLUCOSE: 107 mg/dL — AB (ref 70–99)
Potassium: 5.3 mmol/L — ABNORMAL HIGH (ref 3.5–5.1)
SODIUM: 141 mmol/L (ref 135–145)
Total Bilirubin: 0.5 mg/dL (ref 0.3–1.2)
Total Protein: 6.2 g/dL (ref 6.0–8.3)

## 2014-11-25 MED ORDER — VANCOMYCIN HCL 500 MG IV SOLR
500.0000 mg | INTRAVENOUS | Status: DC
  Administered 2014-11-25 – 2014-11-26 (×2): 500 mg via INTRAVENOUS
  Filled 2014-11-25 (×2): qty 500

## 2014-11-25 MED ORDER — IOHEXOL 300 MG/ML  SOLN
50.0000 mL | Freq: Once | INTRAMUSCULAR | Status: AC | PRN
  Administered 2014-11-25: 50 mL via ORAL

## 2014-11-25 MED ORDER — HYDRALAZINE HCL 50 MG PO TABS
100.0000 mg | ORAL_TABLET | Freq: Three times a day (TID) | ORAL | Status: DC
  Administered 2014-11-25 – 2014-11-26 (×2): 100 mg via ORAL
  Filled 2014-11-25 (×10): qty 2

## 2014-11-25 NOTE — Progress Notes (Signed)
ANTIBIOTIC CONSULT NOTE - INITIAL  Pharmacy Consult for Vancomycin Indication: pneumonia  Allergies  Allergen Reactions  . Atenolol Other (See Comments)    unknown  . Ramipril Cough  . Simvastatin Other (See Comments)    unknown    Patient Measurements: Height: 5' (152.4 cm) Weight: 111 lb 6.4 oz (50.531 kg) IBW/kg (Calculated) : 45.5   Vital Signs: Temp: 97.9 F (36.6 C) (12/26 0153) Temp Source: Oral (12/26 0153) BP: 166/74 mmHg (12/26 0620) Pulse Rate: 66 (12/26 0620) Intake/Output from previous day: 12/25 0701 - 12/26 0700 In: 2193.3 [P.O.:1080; I.V.:963.3; IV Piggyback:150] Out: 1477 [Urine:1300; Drains:177] Intake/Output from this shift:    Labs:  Recent Labs  11/23/14 0418 11/24/14 0420  WBC 21.6* 25.5*  HGB 9.2* 11.3*  PLT 298 357  CREATININE 1.23* 1.46*   Estimated Creatinine Clearance: 21.7 mL/min (by C-G formula based on Cr of 1.46). No results for input(s): VANCOTROUGH, VANCOPEAK, VANCORANDOM, GENTTROUGH, GENTPEAK, GENTRANDOM, TOBRATROUGH, TOBRAPEAK, TOBRARND, AMIKACINPEAK, AMIKACINTROU, AMIKACIN in the last 72 hours.   Microbiology: Recent Results (from the past 720 hour(s))  Culture, blood (routine x 2)     Status: None   Collection Time: 11/15/14  6:59 PM  Result Value Ref Range Status   Specimen Description BLOOD RIGHT HAND  Final   Special Requests BOTTLES DRAWN AEROBIC ONLY 1ML  Final   Culture  Setup Time   Final    11/16/2014 05:47 Performed at [facility]    Culture   Final    NO GROWTH 5 DAYS Performed at [facility]    Report Status 11/22/2014 FINAL  Final  Culture, blood (routine x 2)     Status: None   Collection Time: 11/15/14  7:07 PM  Result Value Ref Range Status   Specimen Description BLOOD RIGHT FOREARM  Final   Special Requests BOTTLES DRAWN AEROBIC ONLY 2ML  Final   Culture  Setup Time   Final    11/16/2014 05:47 Performed at [facility]    Culture   Final    NO GROWTH 5  DAYS Performed at [facility]    Report Status 11/22/2014 FINAL  Final  MRSA PCR Screening     Status: Abnormal   Collection Time: 11/15/14 10:34 PM  Result Value Ref Range Status   MRSA by PCR POSITIVE (A) NEGATIVE Final    Comment:        The GeneXpert MRSA Assay (FDA approved for NASAL specimens only), is one component of a comprehensive MRSA colonization surveillance program. It is not intended to diagnose MRSA infection nor to guide or monitor treatment for MRSA infections. RESULT CALLED TO, READ BACK BY AND VERIFIED WITH: L.HUDSON,RN AT 0219 ON 11/16/14 BY SHEA.W   Clostridium Difficile by PCR     Status: None   Collection Time: 11/17/14 12:18 AM  Result Value Ref Range Status   C difficile by pcr NEGATIVE NEGATIVE Final    Comment: Performed at [facility]  Clostridium Difficile by PCR     Status: None   Collection Time: 11/19/14  6:59 PM  Result Value Ref Range Status   C difficile by pcr NEGATIVE NEGATIVE Final    Comment: Performed at [facility]  Surgical pcr screen     Status: Abnormal   Collection Time: 11/20/14  1:32 PM  Result Value Ref Range Status   MRSA, PCR POSITIVE (A) NEGATIVE Final    Comment: RESULT CALLED TO, READ BACK BY AND VERIFIED WITH: Reymundo PollUNKELBERGER K  RN 1554 11/20/14 [place] [facility]    Staphylococcus aureus POSITIVE (A) NEGATIVE Final    Comment:        The Xpert SA Assay (FDA approved for NASAL specimens in patients over 78 years of age), is one component of a comprehensive surveillance program.  Test performance has been validated by [facility] for patients greater than or equal to 78 year old. It is not intended to diagnose infection nor to guide or monitor treatment.     Medical History: Past Medical History  Diagnosis Date  . Atrial fibrillation     Rapid response, hospital, September, 2012  . CHF (congestive heart failure)     Combined systolic and diastolic, EF 40-45%, 2012  . Ejection fraction      EF 40-45%, echo, September, 2012  . History of noncompliance with medical treatment   . Hypertension   . Dyslipidemia   . Dementia      ? mild ?  . CAD (coronary artery disease)     PCI to LAD early 1990s  . Degenerative joint disease   . Chest pain     Chest wall pain and costochondritis  . CKD (chronic kidney disease), stage IV   . Edema     Left greater than right lower extremity, No DVT by Doppler, September, 2012  . Aortic insufficiency     Mild, echo, September, 2012  . Mitral regurgitation     Moderate, echo, 2012  . Pulmonary hypertension     46 mmHg, echo, 2012  . Hyperlipidemia   . Bradycardia     October, 2012  . Fatigue     October, 2012    Medications:  Scheduled:  . amLODipine  10 mg Oral Daily  . antiseptic oral rinse  7 mL Mouth Rinse q12n4p  . budesonide (PULMICORT) nebulizer solution  0.25 mg Nebulization BID  . chlorhexidine  15 mL Mouth Rinse BID  . cloNIDine  0.3 mg Transdermal Weekly  . enoxaparin (LOVENOX) injection  30 mg Subcutaneous Q24H  . hydrALAZINE  50 mg Oral 3 times per day  . latanoprost  1 drop Both Eyes QHS  . metoprolol  5 mg Intravenous 4 times per day  . metoprolol succinate  25 mg Oral Daily  . piperacillin-tazobactam (ZOSYN)  IV  3.375 g Intravenous 3 times per day  . vancomycin  500 mg Intravenous Q24H   Assessment: 80 y.o. Female with chronic renal disease, community acquired pneumonia, MRSA positive, already on Zosyn.  Pharmacy consulted to dose Vancomycin.  Goal of Therapy:  Vanc trough 15-20 Vancomycin per renal function  Plan:   Vancomycin 500mg  IV q24h  Follow renal function/clinical course  Vanc trough at steady state  Kristi Phenixllen Betrice Wanat RPh 11/25/2014, 9:58 AM Pager [phone]

## 2014-11-25 NOTE — Progress Notes (Addendum)
[facility]                                                                              Patient Demographics  Kristi [name], is a 80 y.o. female, DOB - May 23, 1931, ZOX:[id number]  Admit date - 11/15/2014   Admitting Physician Ron Parker, MD  Outpatient Primary MD for the patient is No primary care provider on file.  LOS - 10   Chief Complaint  Patient presents with  . Shaking        Assessment & Plan   Sepsis secondary to HCAP/aspiration pneumonia vs peritonitis Persistent leukocytosis, afebrile -Repeat chest x-ray 11/18/2014 showed RLL consolidation -Influenza PCR negative -Continue Zosyn, and vancomycin -Blood cultures negative from 11/15/2014 -Urinalysis also negative   Shortness of breath Could be a combination of right lower lobe pneumonia secondary to aspiration vs volume overload secondary to aggressive IV hydration We have discontinued IV fluids Patient more comfortable after receiving 2 doses of IV Lasix Would consider broadening antibiotic coverage - in light of her being MRSA positive. Add vancomycin to Zosyn CT of the chest pending   Pneumoperitoneum -Diagnostic laparoscopy with placement of drains - 11/20/2014 - H. Derrell Lolling Had pneumoperitoneum without clear source. She has 2 drains which have benign appearing serous fluid On clear liquids. Plan repeat CT scan today of abdomen/pelvis. She clinically looks benign, but since she is not a good historian and her presentation was unclear - will get CT to follow up laparoscopic surgery.   Acute on chronic renal failure, stage III -Baseline creatinine appears to be 1.1-1.4 Creatinine slightly today probably because of the Lasix given 12/24, will not restart fluid -Continue to monitor BMP   Atrial fibrillation -Currently rate controlled -Continue metoprolol IV Restarted by mouth metoprolol  Hypertension Restarted  Toprol and Norvasc Patient also started on IV Lasix Continue IV hydralazine when necessary   Dysphagia with history of esophageal stricture -Barium esophagogram was negative for stricture shows esophageal dysmotility  Dementia -Continue Aricept and Namenda  Chronic diastolic heart failure -Currently appears euvolemic and compensated -Lasix held -Continue to monitor daily weights, intake and output  Hypernatremia/hypokalemia -Resolved likely secondary to volume depletion Replete potassium orally  Normocytic anemia -Hemoglobin appears to be stable, hemoglobin 11.3 -Continue to monitor CBC  Code Status: DO NOT RESUSCITATE,  Family Communication: called patient and updated her [name] Daughter [phone]  She would like to be conservative in terms of treatment    Disposition Plan: Palliative care consultation, to discuss goals of care, patient really not a candidate for any aggressive intervention   Time Spent in minutes   30 minutes  Procedures  Laparoscopy 11/20/2014  Consults   Gen. surgery  DVT Prophylaxis  Lovenox  Lab Results  Component Value Date   PLT 357 11/24/2014    Medications  Scheduled Meds: . amLODipine  10 mg Oral Daily  . antiseptic oral rinse  7 mL Mouth Rinse q12n4p  . budesonide (PULMICORT) nebulizer solution  0.25 mg Nebulization BID  . chlorhexidine  15 mL Mouth Rinse BID  . cloNIDine  0.3 mg Transdermal Weekly  . enoxaparin (LOVENOX) injection  30 mg Subcutaneous Q24H  . hydrALAZINE  50 mg Oral 3 times  per day  . latanoprost  1 drop Both Eyes QHS  . metoprolol  5 mg Intravenous 4 times per day  . metoprolol succinate  25 mg Oral Daily  . piperacillin-tazobactam (ZOSYN)  IV  3.375 g Intravenous 3 times per day  . vancomycin  500 mg Intravenous Q24H   Continuous Infusions: . 0.9 % sodium chloride with kcl 50 mL/hr at 11/24/14 1144   PRN Meds:.hydrALAZINE, HYDROmorphone (DILAUDID) injection, ondansetron **OR** ondansetron  (ZOFRAN) IV  Antibiotics    Anti-infectives    Start     Dose/Rate Route Frequency Ordered Stop   11/25/14 1000  vancomycin (VANCOCIN) 500 mg in sodium chloride 0.9 % 100 mL IVPB     500 mg100 mL/hr over 60 Minutes Intravenous Every 24 hours 11/25/14 0953     11/20/14 2200  piperacillin-tazobactam (ZOSYN) IVPB 3.375 g     3.375 g12.5 mL/hr over 240 Minutes Intravenous 3 times per day 11/20/14 1749     11/16/14 2200  vancomycin (VANCOCIN) 500 mg in sodium chloride 0.9 % 100 mL IVPB  Status:  Discontinued     500 mg100 mL/hr over 60 Minutes Intravenous Every 24 hours 11/15/14 1916 11/17/14 1600   11/16/14 0400  piperacillin-tazobactam (ZOSYN) IVPB 3.375 g  Status:  Discontinued     3.375 g12.5 mL/hr over 240 Minutes Intravenous Every 8 hours 11/15/14 1916 11/20/14 1811   11/15/14 1915  vancomycin (VANCOCIN) IVPB 1000 mg/200 mL premix     1,000 mg200 mL/hr over 60 Minutes Intravenous STAT 11/15/14 1909 11/15/14 2320   11/15/14 1915  piperacillin-tazobactam (ZOSYN) IVPB 3.375 g     3.375 g100 mL/hr over 30 Minutes Intravenous STAT 11/15/14 1909 11/15/14 2011        Subjective:   [name] seen and examined today.   "I just do not feel well", cannot specify she is nauseous, denies any abdominal pain   Objective:   Filed Vitals:   11/25/14 0153 11/25/14 0500 11/25/14 0620 11/25/14 0755  BP: 159/74  166/74   Pulse: 69  66   Temp: 97.9 F (36.6 C)     TempSrc: Oral     Resp: 18  18   Height:      Weight:  50.531 kg (111 lb 6.4 oz)    SpO2: 100%  100% 100%    Wt Readings from Last 3 Encounters:  11/25/14 50.531 kg (111 lb 6.4 oz)  12/20/11 50.758 kg (111 lb 14.4 oz)  09/03/11 46.72 kg (103 lb)     Intake/Output Summary (Last 24 hours) at 11/25/14 1109 Last data filed at 11/25/14 0700  Gross per 24 hour  Intake 2193.33 ml  Output   1477 ml  Net 716.33 ml    Exam  General: Well developed, NAD, appears stated age  HEENT: NCAT, mucous membranes moist.    Cardiovascular: S1 S2 auscultated, Regular rate and rhythm.  Respiratory: Clear to auscultation bilaterally with equal chest rise  Abdomen: Soft, nontender, nondistended, + bowel sounds, JP drains in place-serosanguineous fluid  Extremities: warm dry without cyanosis clubbing or edema  Neuro: Awake and alert, oriented to self, no focal deficits, follows commands  Data Review   Micro Results Recent Results (from the past 240 hour(s))  Culture, blood (routine x 2)     Status: None   Collection Time: 11/15/14  6:59 PM  Result Value Ref Range Status   Specimen Description BLOOD RIGHT HAND  Final   Special Requests BOTTLES DRAWN AEROBIC ONLY  Final  Culture  Setup Time   Final    11/16/2014 05:47 Performed at [facility]    Culture   Final    NO GROWTH 5 DAYS Performed at [facility]    Report Status 11/22/2014 FINAL  Final  Culture, blood (routine x 2)     Status: None   Collection Time: 11/15/14  7:07 PM  Result Value Ref Range Status   Specimen Description BLOOD RIGHT FOREARM  Final   Special Requests BOTTLES DRAWN AEROBIC ONLY 2ML  Final   Culture  Setup Time   Final    11/16/2014 05:47 Performed at [facility]    Culture   Final    NO GROWTH 5 DAYS Performed at [facility]    Report Status 11/22/2014 FINAL  Final  MRSA PCR Screening     Status: Abnormal   Collection Time: 11/15/14 10:34 PM  Result Value Ref Range Status   MRSA by PCR POSITIVE (A) NEGATIVE Final    Comment:        The GeneXpert MRSA Assay (FDA approved for NASAL specimens only), is one component of a comprehensive MRSA colonization surveillance program. It is not intended to diagnose MRSA infection nor to guide or monitor treatment for MRSA infections. RESULT CALLED TO, READ BACK BY AND VERIFIED WITH: L.HUDSON,RN AT 0219 ON 11/16/14 BY SHEA.W   Clostridium Difficile by PCR     Status: None   Collection Time: 11/17/14 12:18 AM  Result Value Ref  Range Status   C difficile by pcr NEGATIVE NEGATIVE Final    Comment: Performed at [facility]  Clostridium Difficile by PCR     Status: None   Collection Time: 11/19/14  6:59 PM  Result Value Ref Range Status   C difficile by pcr NEGATIVE NEGATIVE Final    Comment: Performed at [facility]   Surgical pcr screen     Status: Abnormal   Collection Time: 11/20/14  1:32 PM  Result Value Ref Range Status   MRSA, PCR POSITIVE (A) NEGATIVE Final    Comment: RESULT CALLED TO, READ BACK BY AND VERIFIED WITH: Reymundo PollDUNKELBERGER K RN 1554 11/20/14 [place]TE V    Staphylococcus aureus POSITIVE (A) NEGATIVE Final    Comment:        The Xpert SA Assay (FDA approved for NASAL specimens in patients over 78 years of age), is one component of a comprehensive surveillance program.  Test performance has been validated by [facility] for patients greater than or equal to 78 year old. It is not intended to diagnose infection nor to guide or monitor treatment.     Radiology Reports Ct Abdomen Pelvis Wo Contrast  11/19/2014   CLINICAL DATA:  Fever and chills. Leukocytosis with presumed aspiration pneumonia. History of coronary artery disease, dementia and renal insufficiency. Initial encounter.  EXAM: CT ABDOMEN AND PELVIS WITHOUT CONTRAST  TECHNIQUE: Multidetector CT imaging of the abdomen and pelvis was performed following the standard protocol without IV contrast.  COMPARISON:  Chest and abdominal radiographs 11/18/2014. Report from abdominal CT 01/29/2001.  FINDINGS: Lower chest: There are small to moderate dependent pleural effusions bilaterally with associated dependent bibasilar airspace opacities. These are most consistent with atelectasis. There is no consolidation or significant pericardial effusion. Extensive atherosclerosis of the coronary arteries and thoracic aorta noted. The heart is enlarged.  Hepatobiliary: The liver demonstrates decreased density consistent with steatosis. No  focal lesions are identified on non contrast imaging. The gallbladder is surgically absent. There is  air within the extrahepatic and left intrahepatic biliary system.  Pancreas:  Unremarkable on noncontrast imaging.  Spleen: Normal in size without focal abnormality.  Adrenals/Urinary Tract: Both adrenal glands are somewhat indistinct without focal mass or hematoma.The left kidney demonstrates asymmetric cortical thinning and atrophy. There is some extraluminal gas around the left renal hilum, but no definite air within its collecting system. There is no evidence of hydronephrosis or ureteral calculus. The bladder appears unremarkable.  Stomach/Bowel: Contrast from recent esophagram is present within the colon. There is no colonic wall thickening or extravasated enteric contrast. Diverticular changes are present throughout the colon. There is no definite bowel wall thickening or focal surrounding inflammation. However, multiple extraluminal air bubbles are present. These are most pronounced within the right upper quadrant, inferior to the cholecystectomy bed. Extraluminal air tracks inferiorly along the retroperitoneum. There are several extraluminal air bubbles within the pelvis.There is no free intraperitoneal air or drainable fluid collection.  Vascular/Lymphatic: There are no enlarged abdominal or pelvic lymph nodes. Aortoiliac atherosclerosis noted.  Reproductive: There are vascular calcifications within the uterus. No evidence of adnexal mass.  Other: There is generalized soft tissue edema affecting the subcutaneous and mesenteric fat. No abdominal wall hernia identified.  Musculoskeletal: No acute or significant osseous findings. There are degenerative changes throughout the spine with a grade 1 anterolisthesis at the lower 3 lumbar disc space levels.  IMPRESSION: 1. Multiple extraluminal air collections are present within the abdomen and pelvis, primarily within the retroperitoneum, consistent with a  perforated viscus. Because most of the air is in the right upper quadrant, this could be due to a perforated duodenal ulcer. Alternatively, findings could be secondary to perforated sigmoid diverticulitis. There is no free air or drainable fluid collection. 2. Pneumobilia, probably related to previous sphincterotomy or biliary bypass. 3. Bilateral pleural effusions with dependent opacities at both lung bases. No definite aspiration pneumonia demonstrated by this examination. 4. These results were called by telephone at the time of interpretation on 11/19/2014 at 7:50 pm to the patient's nurse, Tinnie GensJennie, who verbally acknowledged these results.   Electronically Signed   By: Roxy HorsemanBill  Veazey M.D.   On: 11/19/2014 19:53   Dg Chest 2 View  11/18/2014   CLINICAL DATA:  Shortness of breath  EXAM: CHEST  2 VIEW  COMPARISON:  Chest radiograph 11/15/2014  FINDINGS: Stable enlarged cardiac and mediastinal contours with tortuosity and calcification of thoracic aorta. Interval development of focal heterogeneous opacity within the right lung base. Thoracic spine degenerative change.  IMPRESSION: New focal consolidative opacity within the peripheral right lower lobe, concerning for infection in the appropriate clinical setting. Atelectasis would be an alternative consideration. Recommend short term radiographic followup to ensure resolution.   Electronically Signed   By: Annia Beltrew  Davis M.D.   On: 11/18/2014 12:11   Dg Chest 2 View  11/15/2014   CLINICAL DATA:  [age] year old female with abnormal shaking, chills. Initial encounter.  EXAM: CHEST  2 VIEW  COMPARISON:  12/17/2011.  FINDINGS: Low lung volumes. Chronic cardiomegaly. Stable cardiac size and mediastinal contours. No pneumothorax or pulmonary edema. No pleural effusion or consolidation. Eventration of the diaphragm re - identified. No acute osseous abnormality identified. Stable cholecystectomy clips.  IMPRESSION: Low lung volumes, otherwise no acute cardiopulmonary  abnormality.   Electronically Signed   By: Augusto GambleLee  Hall M.D.   On: 11/15/2014 16:35   Dg Esophagus  11/18/2014   CLINICAL DATA:  PT C/O DYSPHAGIA, N/V W/MEALS, FAMILY REPORTS H/O ESOPHAGEAL STRICTURE WHICH REQUIRED DILATION "A  LONG TIME AGO"  EXAM: ESOPHOGRAM/BARIUM SWALLOW  TECHNIQUE: Single contrast examination was performed using  thin barium.  FLUOROSCOPY TIME:  1 min and 34 seconds  COMPARISON:  None.  FINDINGS: Pharyngeal swallowing function is grossly normal with no aspiration visualized.  Esophagus is normal in course. There is a small sliding hiatal hernia. There is no convincing stricture. No esophageal mass and no evidence of esophagitis. Study was somewhat limited by patient's probability to only take small swallows.  There is esophageal dysmotility with significant tertiary contractions and barium stasis. No reflux was documented during the exam.  IMPRESSION: 1. Esophageal dysmotility. 2. No convincing mass or stricture. 3. Small hiatal hernia.   Electronically Signed   By: Amie Portland M.D.   On: 11/18/2014 12:39   Dg Chest Port 1 View  11/23/2014   CLINICAL DATA:  CHF.  Chest pain  EXAM: PORTABLE CHEST - 1 VIEW  COMPARISON:  11/18/2014  FINDINGS: There is moderate cardiac enlargement. Atherosclerotic plaque is noted within the aortic arch. There is a small left pleural effusion. Increase in interstitial edema. Multifocal areas of subsegmental atelectasis noted bilaterally.  IMPRESSION: 1. Increase in CHF pattern.   Electronically Signed   By: Signa Kell M.D.   On: 11/23/2014 10:43   Dg Abd Portable 1v  11/18/2014   CLINICAL DATA:  Evaluate for clearance of barium from esophagram. Patient scheduled for CT.  EXAM: PORTABLE ABDOMEN - 1 VIEW  COMPARISON:  Earlier same day  FINDINGS: Residual barium contrast material is demonstrated throughout the colon to the level of the rectum. Relative paucity of small bowel gas. Right upper quadrant surgical clips. Lumbar spine degenerative change.   IMPRESSION: Residual barium is demonstrated throughout the colon to the level of the rectum.   Electronically Signed   By: Annia Belt M.D.   On: 11/18/2014 17:32    CBC  Recent Labs Lab 11/20/14 0402 11/21/14 0452 11/22/14 0415 11/23/14 0418 11/24/14 0420  WBC 22.6* 26.1* 23.1* 21.6* 25.5*  HGB 8.9* 9.6* 9.3* 9.2* 11.3*  HCT 27.6* 30.5* 29.5* 29.5* 35.6*  PLT 278 275 327 298 357  MCV 96.5 97.4 97.4 96.4 96.2  MCH 31.1 30.7 30.7 30.1 30.5  MCHC 32.2 31.5 31.5 31.2 31.7  RDW 13.5 13.7 13.9 13.9 13.9  LYMPHSABS 1.6  --   --  1.7  --   MONOABS 1.1*  --   --  1.5*  --   EOSABS 0.0  --   --  0.0  --   BASOSABS 0.0  --   --  0.0  --     Chemistries   Recent Labs Lab 11/20/14 0402 11/21/14 0452 11/22/14 0415 11/23/14 0418 11/24/14 0420 11/25/14 0858  NA 146 145 138 145 139 141  K 3.1* 3.7 3.6 4.2 2.9* 5.3*  CL 112 113* 112 120* 105 113*  CO2 20 23 19 19 21 23   GLUCOSE 128* 93 114* 110* 87 107*  BUN 18 15 14 15 15 13   CREATININE 1.39* 1.36* 1.28* 1.23* 1.46* 1.50*  CALCIUM 9.4 9.0 8.5 8.7 8.6 8.8  MG 2.0  --   --   --   --   --   AST  --   --  29  --  21 16  ALT  --   --  26  --  23 16  ALKPHOS  --   --  64  --  64 56  BILITOT  --   --  0.5  --  0.5 0.5   ------------------------------------------------------------------------------------------------------------------ estimated creatinine clearance is 21.1 mL/min (by C-G formula based on Cr of 1.5). ------------------------------------------------------------------------------------------------------------------ No results for input(s): HGBA1C in the last 72 hours. ------------------------------------------------------------------------------------------------------------------ No results for input(s): CHOL, HDL, LDLCALC, TRIG, CHOLHDL, LDLDIRECT in the last 72 hours. ------------------------------------------------------------------------------------------------------------------ No results for input(s): TSH,  T4TOTAL, T3FREE, THYROIDAB in the last 72 hours.  Invalid input(s): FREET3 ------------------------------------------------------------------------------------------------------------------ No results for input(s): VITAMINB12, FOLATE, FERRITIN, TIBC, IRON, RETICCTPCT in the last 72 hours.  Coagulation profile No results for input(s): INR, PROTIME in the last 168 hours.  No results for input(s): DDIMER in the last 72 hours.  Cardiac Enzymes No results for input(s): CKMB, TROPONINI, MYOGLOBIN in the last 168 hours.  Invalid input(s): CK ------------------------------------------------------------------------------------------------------------------ Invalid input(s): POCBNP    Alistair Senft D.O. on 11/25/2014 at 11:09 AM  Between 7am to 7pm - Pager - [phone]  After 7pm go to [email or web address] - password [email or web address]  And look for the night coverage person covering for me after hours  [facility] Office  [phone]

## 2014-11-25 NOTE — Progress Notes (Signed)
General Surgery Note  LOS: 10 days  POD -  5 Days Post-Op  Assessment/Plan: 1.  Diagnostic laparoscopy with placement of drains - 11/20/2014 - H. Derrell Lollingngram  Had pneumoperitoneum without clear source.  She has 2 drains which have benign appearing serous fluid  On clear liquids.  Plan repeat CT scan today of abdomen/pelvis.  She clinically looks benign, but since she is not a good historian and her presentation was unclear - will get CT to follow up laparoscopic surgery.   2.  Community acquired Pneumonia - RLL consolidation  Zosyn  Would consider broadening antibiotic coverage - in light of her being MRSA positive.  Discussed with Dr. Susie CassetteAbrol - will also order CT scan of chest.  With her chronic renal disease - will hold on IV contrast for CT scans.   3.  Bilateral pleural effusions 4.  Hx of AF/CHF/EF 40-45%/AI 5.  Pulmonary hypertension 6.  Hypertension 7.  Chronic renal disease Stage IV  Creat - 1.46 - 11/24/2014 8.  Dementia - currently resident on SNF  9.  Ongoing leukocytosis - labs today pending WBC 25,500 - 11/24/2014 10.  Anemia - Hgb - 9.2 - 11/23/2014, but today Hgb at 11.3.  11.  DVT prophylaxis - Lovenox   Principal Problem:   SIRS (systemic inflammatory response syndrome) Active Problems:   Atrial fibrillation   CHF (congestive heart failure)   Hypertension   Dementia   CAD (coronary artery disease)   CKD (chronic kidney disease), stage IV   Leukocytosis   Sepsis   Aspiration pneumonia   Acute on chronic renal failure   Dysphagia   Pneumoperitoneum of unknown etiology   Pneumoperitoneum   Subjective:  No change clinically or mentally.  She talks and responds, but is not oriented to place. Objective:   Filed Vitals:   11/25/14 0620  BP: 166/74  Pulse: 66  Temp:   Resp: 18     Intake/Output from previous day:  12/25 0701 - 12/26 0700 In: 2193.3 [P.O.:1080; I.V.:963.3; IV Piggyback:150] Out: 1477 [Urine:1300; Drains:177]  Intake/Output  this shift:      Physical Exam:   General: Older AA F who is alert.   HEENT: Normal. Pupils equal. .   Lungs: Poor inspiratory effort.   Abdomen: Soft.  BS present.  She has a right sided drain and a left sided drain.  Left/right - 105/72 cc last 24 hours.   Lab Results:     Recent Labs  11/23/14 0418 11/24/14 0420  WBC 21.6* 25.5*  HGB 9.2* 11.3*  HCT 29.5* 35.6*  PLT 298 357    BMET    Recent Labs  11/23/14 0418 11/24/14 0420  NA 145 139  K 4.2 2.9*  CL 120* 105  CO2 19 21  GLUCOSE 110* 87  BUN 15 15  CREATININE 1.23* 1.46*  CALCIUM 8.7 8.6    PT/INR  No results for input(s): LABPROT, INR in the last 72 hours.  ABG  No results for input(s): PHART, HCO3 in the last 72 hours.  Invalid input(s): PCO2, PO2   Studies/Results:  Dg Chest Port 1 View  11/23/2014   CLINICAL DATA:  CHF.  Chest pain  EXAM: PORTABLE CHEST - 1 VIEW  COMPARISON:  11/18/2014  FINDINGS: There is moderate cardiac enlargement. Atherosclerotic plaque is noted within the aortic arch. There is a small left pleural effusion. Increase in interstitial edema. Multifocal areas of subsegmental atelectasis noted bilaterally.  IMPRESSION: 1. Increase in CHF pattern.   Electronically Signed  By: Signa Kellaylor  Stroud M.D.   On: 11/23/2014 10:43     Anti-infectives:   Anti-infectives    Start     Dose/Rate Route Frequency Ordered Stop   11/20/14 2200  piperacillin-tazobactam (ZOSYN) IVPB 3.375 g     3.375 g12.5 mL/hr over 240 Minutes Intravenous 3 times per day 11/20/14 1749     11/16/14 2200  vancomycin (VANCOCIN) 500 mg in sodium chloride 0.9 % 100 mL IVPB  Status:  Discontinued     500 mg100 mL/hr over 60 Minutes Intravenous Every 24 hours 11/15/14 1916 11/17/14 1600   11/16/14 0400  piperacillin-tazobactam (ZOSYN) IVPB 3.375 g  Status:  Discontinued     3.375 g12.5 mL/hr over 240 Minutes Intravenous Every 8 hours 11/15/14 1916 11/20/14 1811   11/15/14 1915  vancomycin (VANCOCIN) IVPB 1000 mg/200 mL  premix     1,000 mg200 mL/hr over 60 Minutes Intravenous STAT 11/15/14 1909 11/15/14 2320   11/15/14 1915  piperacillin-tazobactam (ZOSYN) IVPB 3.375 g     3.375 g100 mL/hr over 30 Minutes Intravenous STAT 11/15/14 1909 11/15/14 2011      Ovidio Kinavid Lera Gaines, MD, FACS Pager: [phone] [facility] Office: [phone] 11/25/2014

## 2014-11-26 DIAGNOSIS — K439 Ventral hernia without obstruction or gangrene: Secondary | ICD-10-CM

## 2014-11-26 LAB — CBC WITH DIFFERENTIAL/PLATELET
Basophils Absolute: 0 10*3/uL (ref 0.0–0.1)
Basophils Relative: 0 % (ref 0–1)
EOS PCT: 2 % (ref 0–5)
Eosinophils Absolute: 0.4 10*3/uL (ref 0.0–0.7)
HCT: 31.7 % — ABNORMAL LOW (ref 36.0–46.0)
HEMOGLOBIN: 10.1 g/dL — AB (ref 12.0–15.0)
LYMPHS PCT: 11 % — AB (ref 12–46)
Lymphs Abs: 2.2 10*3/uL (ref 0.7–4.0)
MCH: 30.6 pg (ref 26.0–34.0)
MCHC: 31.9 g/dL (ref 30.0–36.0)
MCV: 96.1 fL (ref 78.0–100.0)
Monocytes Absolute: 1.6 10*3/uL — ABNORMAL HIGH (ref 0.1–1.0)
Monocytes Relative: 8 % (ref 3–12)
NEUTROS PCT: 79 % — AB (ref 43–77)
Neutro Abs: 15.6 10*3/uL — ABNORMAL HIGH (ref 1.7–7.7)
PLATELETS: 312 10*3/uL (ref 150–400)
RBC: 3.3 MIL/uL — AB (ref 3.87–5.11)
RDW: 14.2 % (ref 11.5–15.5)
WBC: 19.8 10*3/uL — ABNORMAL HIGH (ref 4.0–10.5)

## 2014-11-26 LAB — BASIC METABOLIC PANEL
ANION GAP: 6 (ref 5–15)
BUN: 11 mg/dL (ref 6–23)
CO2: 20 mmol/L (ref 19–32)
Calcium: 8.2 mg/dL — ABNORMAL LOW (ref 8.4–10.5)
Chloride: 114 mEq/L — ABNORMAL HIGH (ref 96–112)
Creatinine, Ser: 1.39 mg/dL — ABNORMAL HIGH (ref 0.50–1.10)
GFR calc Af Amer: 40 mL/min — ABNORMAL LOW (ref >90)
GFR, EST NON AFRICAN AMERICAN: 35 mL/min — AB (ref >90)
GLUCOSE: 86 mg/dL (ref 70–99)
POTASSIUM: 4.7 mmol/L (ref 3.5–5.1)
SODIUM: 140 mmol/L (ref 135–145)

## 2014-11-26 LAB — MAGNESIUM: Magnesium: 1.6 mg/dL (ref 1.5–2.5)

## 2014-11-26 MED ORDER — FUROSEMIDE 10 MG/ML IJ SOLN
40.0000 mg | Freq: Once | INTRAMUSCULAR | Status: AC
  Administered 2014-11-27: 40 mg via INTRAVENOUS
  Filled 2014-11-26: qty 4

## 2014-11-26 MED ORDER — FUROSEMIDE 10 MG/ML IJ SOLN
20.0000 mg | Freq: Once | INTRAMUSCULAR | Status: AC
Start: 2014-11-26 — End: 2014-11-26
  Administered 2014-11-26: 20 mg via INTRAVENOUS
  Filled 2014-11-26: qty 2

## 2014-11-26 NOTE — Progress Notes (Signed)
Report called and given to Patty, rn on 3 west. All questions answered. Vwilliams,rn.

## 2014-11-26 NOTE — Clinical Social Work Note (Signed)
CSW met with pt and her niece at bedside on 11/25/14  CSW called and spoke with pt's daughter while at bedside and discussed MD notes that had reflected palliative care options  Pt's daughter is ok with not doing anything aggressive for her mother and was aware of the MD's thoughts on  pallative   CSW reviewed chart which reflected that MD ordered a palliative consultation.  CSW will continue to follow pt    .Dede Query, LCSW [facility] Clinical Social Worker - Weekend Coverage cell #: [phone]

## 2014-11-26 NOTE — Progress Notes (Addendum)
[facility]                                                                              Patient Demographics  Kristi [name], is a 80 y.o. female, DOB - 07-17-1931, ZOX:[id number]  Admit date - 11/15/2014   Admitting Physician Kristi Parker, MD  Outpatient Primary MD for the patient is No primary care provider on file.  LOS - 11   Chief Complaint  Patient presents with  . Shaking         Assessment & Plan   Sepsis secondary to HCAP/aspiration pneumonia vs peritonitis Persistent leukocytosis, afebrile -Repeat chest x-ray 11/18/2014 showed RLL consolidation, however CT scan 12/26 did not show a pneumonia just bilateral pleural effusions -Influenza PCR negative Discontinue Zosyn, and vancomycin  -Blood cultures negative from 11/15/2014 -Urinalysis also negative   Shortness of breath Could be a combination of right lower lobe pneumonia secondary to aspiration vs volume overload secondary to aggressive IV hydration We have discontinued IV fluids Patient more comfortable after receiving 2 doses of IV Lasix, continue low-dose Lasix as CT scan shows bilateral pleural effusions      Pneumoperitoneum -Diagnostic laparoscopy with placement of drains - 11/20/2014 - Kristi [name] Had pneumoperitoneum without clear source. She has 2 drains which have benign appearing serous fluid On clear liquids.   repeat CT scan today of abdomen/pelvis. No evidence of abscess, removal drains per surgery and advance diet   Acute on chronic renal failure, stage III -Baseline creatinine appears to be 1.1-1.4 Creatinine slightly today probably because of the Lasix given 12/24, will not restart fluid -Continue to monitor BMP   Atrial fibrillation -Currently rate controlled -Continue metoprolol IV Restarted by mouth metoprolol  Hypertension Restarted Toprol and Norvasc Patient also started on IV Lasix Continue IV hydralazine  when necessary   Dysphagia with history of esophageal stricture -Barium esophagogram was negative for stricture shows esophageal dysmotility  Dementia -Continue Aricept and Namenda  Chronic diastolic heart failure -Currently appears euvolemic and compensated -Lasix held -Continue to monitor daily weights, intake and output  Hypernatremia/hypokalemia -Resolved likely secondary to volume depletion Replete potassium orally  Normocytic anemia -Hemoglobin appears to be stable, hemoglobin 11.3 -Continue to monitor CBC  Code Status: DO NOT RESUSCITATE,  Family Communication: called patient and updated her Kristi [name] Daughter [phone]  She would like to be conservative in terms of treatment  Palliative care consultation ordered  Disposition Plan: Palliative care consultation, to discuss goals of care, patient really not a candidate for any aggressive intervention   Time Spent in minutes   30 minutes  Procedures  Laparoscopy 11/20/2014  Consults   Gen. surgery  DVT Prophylaxis  Lovenox  Lab Results  Component Value Date   PLT 312 11/26/2014    Medications  Scheduled Meds: . amLODipine  10 mg Oral Daily  . antiseptic oral rinse  7 mL Mouth Rinse q12n4p  . budesonide (PULMICORT) nebulizer solution  0.25 mg Nebulization BID  . chlorhexidine  15 mL Mouth Rinse BID  . cloNIDine  0.3 mg Transdermal Weekly  . enoxaparin (LOVENOX) injection  30 mg Subcutaneous Q24H  . hydrALAZINE  100 mg Oral 3 times per day  . latanoprost  1 drop Both Eyes QHS  . metoprolol  5 mg Intravenous 4 times per day  . metoprolol succinate  25 mg Oral Daily  . piperacillin-tazobactam (ZOSYN)  IV  3.375 g Intravenous 3 times per day  . vancomycin  500 mg Intravenous Q24H   Continuous Infusions: . 0.9 % sodium chloride with kcl 50 mL/hr at 11/26/14 0534   PRN Meds:.hydrALAZINE, HYDROmorphone (DILAUDID) injection, ondansetron **OR** ondansetron (ZOFRAN) IV  Antibiotics     Anti-infectives    Start     Dose/Rate Route Frequency Ordered Stop   11/25/14 1000  vancomycin (VANCOCIN) 500 mg in sodium chloride 0.9 % 100 mL IVPB     500 mg100 mL/hr over 60 Minutes Intravenous Every 24 hours 11/25/14 0953     11/20/14 2200  piperacillin-tazobactam (ZOSYN) IVPB 3.375 g     3.375 g12.5 mL/hr over 240 Minutes Intravenous 3 times per day 11/20/14 1749     11/16/14 2200  vancomycin (VANCOCIN) 500 mg in sodium chloride 0.9 % 100 mL IVPB  Status:  Discontinued     500 mg100 mL/hr over 60 Minutes Intravenous Every 24 hours 11/15/14 1916 11/17/14 1600   11/16/14 0400  piperacillin-tazobactam (ZOSYN) IVPB 3.375 g  Status:  Discontinued     3.375 g12.5 mL/hr over 240 Minutes Intravenous Every 8 hours 11/15/14 1916 11/20/14 1811   11/15/14 1915  vancomycin (VANCOCIN) IVPB 1000 mg/200 mL premix     1,000 mg200 mL/hr over 60 Minutes Intravenous STAT 11/15/14 1909 11/15/14 2320   11/15/14 1915  piperacillin-tazobactam (ZOSYN) IVPB 3.375 g     3.375 g100 mL/hr over 30 Minutes Intravenous STAT 11/15/14 1909 11/15/14 2011        Subjective:   Kristi [name] seen and examined today.   "I just do not feel well", cannot specify she is nauseous, denies any abdominal pain   Objective:   Filed Vitals:   11/25/14 2100 11/26/14 0500 11/26/14 0610 11/26/14 0908  BP: 148/67 153/80  127/66  Pulse: 71 68  55  Temp: 98.3 F (36.8 C) 98 F (36.7 C)  98.9 F (37.2 C)  TempSrc: Axillary Axillary  Axillary  Resp: 16 18  14   Height:      Weight:   52.708 kg (116 lb 3.2 oz)   SpO2: 96% 99%  98%    Wt Readings from Last 3 Encounters:  11/26/14 52.708 kg (116 lb 3.2 oz)  12/20/11 50.758 kg (111 lb 14.4 oz)  09/03/11 46.72 kg (103 lb)     Intake/Output Summary (Last 24 hours) at 11/26/14 1104 Last data filed at 11/26/14 0846  Gross per 24 hour  Intake   1750 ml  Output   1440 ml  Net    310 ml    Exam  General: Well developed, NAD, appears stated age  HEENT: NCAT,  mucous membranes moist.   Cardiovascular: S1 S2 auscultated, Regular rate and rhythm.  Respiratory: Clear to auscultation bilaterally with equal chest rise  Abdomen: Soft, nontender, nondistended, + bowel sounds, JP drains in place-serosanguineous fluid  Extremities: warm dry without cyanosis clubbing or edema  Neuro: Awake and alert, oriented to self, no focal deficits, follows commands  Data Review   Micro Results Recent Results (from the past 240 hour(s))  Clostridium Difficile by PCR     Status: None   Collection Time: 11/17/14 12:18 AM  Result Value Ref Range Status   C difficile by pcr NEGATIVE NEGATIVE Final    Comment: Performed at [facility]  Clostridium Difficile by PCR     Status: None   Collection Time: 11/19/14  6:59 PM  Result Value Ref Range Status   C difficile by pcr NEGATIVE NEGATIVE Final    Comment: Performed at [facility]  Surgical pcr screen     Status: Abnormal   Collection Time: 11/20/14  1:32 PM  Result Value Ref Range Status   MRSA, PCR POSITIVE (A) NEGATIVE Final    Comment: RESULT CALLED TO, READ BACK BY AND VERIFIED WITH: Reymundo Poll RN [id number] 11/20/14 [place] V    Staphylococcus aureus POSITIVE (A) NEGATIVE Final    Comment:        The Xpert SA Assay (FDA approved for NASAL specimens in patients over 29 years of age), is one component of a comprehensive surveillance program.  Test performance has been validated by [employer] for patients greater than or equal to 24 year old. It is not intended to diagnose infection nor to guide or monitor treatment.     Radiology Reports Ct Abdomen Pelvis Wo Contrast  11/25/2014   CLINICAL DATA:  Postop abdominal laparoscopic surgery, prior colon resection colon resection and cholecystectomy, pneumonia, CHF, atrial fibrillation, hypertension, coronary artery disease, valvular heart disease, stage 4 chronic kidney disease  EXAM: CT CHEST, ABDOMEN AND PELVIS WITHOUT CONTRAST   TECHNIQUE: Multidetector CT imaging of the chest, abdomen and pelvis was performed following the standard protocol without IV contrast. Sagittal and coronal MPR images reconstructed from axial data set.  COMPARISON:  CT abdomen and pelvis 11/19/2014  FINDINGS: CT CHEST FINDINGS  Scattered atherosclerotic calcifications aorta, great vessels and coronary arteries.  Ascending aorta measures 3.9 x 3.6 cm.  No thoracic adenopathy, hilar assessment limited by lack of IV contrast.  BILATERAL pleural effusions and compressive atelectasis of the posterior lungs.  Lungs otherwise clear.  No pneumothorax.  Bones demineralized.  CT ABDOMEN AND PELVIS FINDINGS  Air again identified within the biliary system.  Post cholecystectomy.  Liver, spleen, pancreas, kidneys, and adrenal glands otherwise normal.  Percutaneous drains in the upper abdomen and pelvis.  Retained contrast in colon with distal colonic diverticulosis noted.  Foley catheter decompresses urinary bladder.  Scattered atherosclerotic calcifications.  Normal appendix, uterus, and adnexae.  Bowel loops otherwise normal appearance.  Gastric wall appears thickened proximally though this could be an artifact related to underdistention.  No mass, adenopathy, free fluid or abscess.  Tiny supraumbilical ventral hernia containing fat.  IMPRESSION: BILATERAL pleural effusions and compressive atelectasis of the posterior lungs.  Postoperative changes of the abdomen without evidence of abscess.  Distal colonic diverticulosis.  Upper normal caliber ascending thoracic aorta, recommendation below.  Recommend annual imaging followup by CTA or MRA. This recommendation follows 2010 ACCF/AHA/AATS/ACR/ASA/SCA/SCAI/SIR/STS/SVM Guidelines for the Diagnosis and Management of Patients With Thoracic Aortic Disease. Circulation. 2010; 121: Z610-R604   Electronically Signed   By: Ulyses Southward M.D.   On: 11/25/2014 14:05   Ct Abdomen Pelvis Wo Contrast  11/19/2014   CLINICAL DATA:  Fever and  chills. Leukocytosis with presumed aspiration pneumonia. History of coronary artery disease, dementia and renal insufficiency. Initial encounter.  EXAM: CT ABDOMEN AND PELVIS WITHOUT CONTRAST  TECHNIQUE: Multidetector CT imaging of the abdomen and pelvis was performed following the standard protocol without IV contrast.  COMPARISON:  Chest and abdominal radiographs 11/18/2014. Report from abdominal CT 01/29/2001.  FINDINGS: Lower chest: There are small to moderate dependent pleural effusions bilaterally with associated dependent bibasilar airspace opacities. These are most consistent with atelectasis. There is no  consolidation or significant pericardial effusion. Extensive atherosclerosis of the coronary arteries and thoracic aorta noted. The heart is enlarged.  Hepatobiliary: The liver demonstrates decreased density consistent with steatosis. No focal lesions are identified on non contrast imaging. The gallbladder is surgically absent. There is air within the extrahepatic and left intrahepatic biliary system.  Pancreas:  Unremarkable on noncontrast imaging.  Spleen: Normal in size without focal abnormality.  Adrenals/Urinary Tract: Both adrenal glands are somewhat indistinct without focal mass or hematoma.The left kidney demonstrates asymmetric cortical thinning and atrophy. There is some extraluminal gas around the left renal hilum, but no definite air within its collecting system. There is no evidence of hydronephrosis or ureteral calculus. The bladder appears unremarkable.  Stomach/Bowel: Contrast from recent esophagram is present within the colon. There is no colonic wall thickening or extravasated enteric contrast. Diverticular changes are present throughout the colon. There is no definite bowel wall thickening or focal surrounding inflammation. However, multiple extraluminal air bubbles are present. These are most pronounced within the right upper quadrant, inferior to the cholecystectomy bed. Extraluminal  air tracks inferiorly along the retroperitoneum. There are several extraluminal air bubbles within the pelvis.There is no free intraperitoneal air or drainable fluid collection.  Vascular/Lymphatic: There are no enlarged abdominal or pelvic lymph nodes. Aortoiliac atherosclerosis noted.  Reproductive: There are vascular calcifications within the uterus. No evidence of adnexal mass.  Other: There is generalized soft tissue edema affecting the subcutaneous and mesenteric fat. No abdominal wall hernia identified.  Musculoskeletal: No acute or significant osseous findings. There are degenerative changes throughout the spine with a grade 1 anterolisthesis at the lower 3 lumbar disc space levels.  IMPRESSION: 1. Multiple extraluminal air collections are present within the abdomen and pelvis, primarily within the retroperitoneum, consistent with a perforated viscus. Because most of the air is in the right upper quadrant, this could be due to a perforated duodenal ulcer. Alternatively, findings could be secondary to perforated sigmoid diverticulitis. There is no free air or drainable fluid collection. 2. Pneumobilia, probably related to previous sphincterotomy or biliary bypass. 3. Bilateral pleural effusions with dependent opacities at both lung bases. No definite aspiration pneumonia demonstrated by this examination. 4. These results were called by telephone at the time of interpretation on 11/19/2014 at 7:50 pm to the patient's nurse, Tinnie GensJennie, who verbally acknowledged these results.   Electronically Signed   By: Roxy HorsemanBill  Veazey M.D.   On: 11/19/2014 19:53   Dg Chest 2 View  11/18/2014   CLINICAL DATA:  Shortness of breath  EXAM: CHEST  2 VIEW  COMPARISON:  Chest radiograph 11/15/2014  FINDINGS: Stable enlarged cardiac and mediastinal contours with tortuosity and calcification of thoracic aorta. Interval development of focal heterogeneous opacity within the right lung base. Thoracic spine degenerative change.   IMPRESSION: New focal consolidative opacity within the peripheral right lower lobe, concerning for infection in the appropriate clinical setting. Atelectasis would be an alternative consideration. Recommend short term radiographic followup to ensure resolution.   Electronically Signed   By: Annia Beltrew  Davis M.D.   On: 11/18/2014 12:11   Dg Chest 2 View  11/15/2014   CLINICAL DATA:  80 year old female with abnormal shaking, chills. Initial encounter.  EXAM: CHEST  2 VIEW  COMPARISON:  12/17/2011.  FINDINGS: Low lung volumes. Chronic cardiomegaly. Stable cardiac size and mediastinal contours. No pneumothorax or pulmonary edema. No pleural effusion or consolidation. Eventration of the diaphragm re - identified. No acute osseous abnormality identified. Stable cholecystectomy clips.  IMPRESSION: Low lung volumes, otherwise  no acute cardiopulmonary abnormality.   Electronically Signed   By: Augusto Gamble M.D.   On: 11/15/2014 16:35   Ct Chest Wo Contrast  11/25/2014   CLINICAL DATA:  Postop abdominal laparoscopic surgery, prior colon resection colon resection and cholecystectomy, pneumonia, CHF, atrial fibrillation, hypertension, coronary artery disease, valvular heart disease, stage 4 chronic kidney disease  EXAM: CT CHEST, ABDOMEN AND PELVIS WITHOUT CONTRAST  TECHNIQUE: Multidetector CT imaging of the chest, abdomen and pelvis was performed following the standard protocol without IV contrast. Sagittal and coronal MPR images reconstructed from axial data set.  COMPARISON:  CT abdomen and pelvis 11/19/2014  FINDINGS: CT CHEST FINDINGS  Scattered atherosclerotic calcifications aorta, great vessels and coronary arteries.  Ascending aorta measures 3.9 x 3.6 cm.  No thoracic adenopathy, hilar assessment limited by lack of IV contrast.  BILATERAL pleural effusions and compressive atelectasis of the posterior lungs.  Lungs otherwise clear.  No pneumothorax.  Bones demineralized.  CT ABDOMEN AND PELVIS FINDINGS  Air again  identified within the biliary system.  Post cholecystectomy.  Liver, spleen, pancreas, kidneys, and adrenal glands otherwise normal.  Percutaneous drains in the upper abdomen and pelvis.  Retained contrast in colon with distal colonic diverticulosis noted.  Foley catheter decompresses urinary bladder.  Scattered atherosclerotic calcifications.  Normal appendix, uterus, and adnexae.  Bowel loops otherwise normal appearance.  Gastric wall appears thickened proximally though this could be an artifact related to underdistention.  No mass, adenopathy, free fluid or abscess.  Tiny supraumbilical ventral hernia containing fat.  IMPRESSION: BILATERAL pleural effusions and compressive atelectasis of the posterior lungs.  Postoperative changes of the abdomen without evidence of abscess.  Distal colonic diverticulosis.  Upper normal caliber ascending thoracic aorta, recommendation below.  Recommend annual imaging followup by CTA or MRA. This recommendation follows 2010 ACCF/AHA/AATS/ACR/ASA/SCA/SCAI/SIR/STS/SVM Guidelines for the Diagnosis and Management of Patients With Thoracic Aortic Disease. Circulation. 2010; 121: [id number]   Electronically Signed   By: Ulyses Southward M.D.   On: 11/25/2014 14:05   Dg Esophagus  11/18/2014   CLINICAL DATA:  PT C/O DYSPHAGIA, N/V W/MEALS, FAMILY REPORTS H/O ESOPHAGEAL STRICTURE WHICH REQUIRED DILATION "A LONG TIME AGO"  EXAM: ESOPHOGRAM/BARIUM SWALLOW  TECHNIQUE: Single contrast examination was performed using  thin barium.  FLUOROSCOPY TIME:  1 min and 34 seconds  COMPARISON:  None.  FINDINGS: Pharyngeal swallowing function is grossly normal with no aspiration visualized.  Esophagus is normal in course. There is a small sliding hiatal hernia. There is no convincing stricture. No esophageal mass and no evidence of esophagitis. Study was somewhat limited by patient's probability to only take small swallows.  There is esophageal dysmotility with significant tertiary contractions and barium  stasis. No reflux was documented during the exam.  IMPRESSION: 1. Esophageal dysmotility. 2. No convincing mass or stricture. 3. Small hiatal hernia.   Electronically Signed   By: Amie Portland M.D.   On: 11/18/2014 12:39   Dg Chest Port 1 View  11/23/2014   CLINICAL DATA:  CHF.  Chest pain  EXAM: PORTABLE CHEST - 1 VIEW  COMPARISON:  11/18/2014  FINDINGS: There is moderate cardiac enlargement. Atherosclerotic plaque is noted within the aortic arch. There is a small left pleural effusion. Increase in interstitial edema. Multifocal areas of subsegmental atelectasis noted bilaterally.  IMPRESSION: 1. Increase in CHF pattern.   Electronically Signed   By: Signa Kell M.D.   On: 11/23/2014 10:43   Dg Abd Portable 1v  11/18/2014   CLINICAL DATA:  Evaluate for  clearance of barium from esophagram. Patient scheduled for CT.  EXAM: PORTABLE ABDOMEN - 1 VIEW  COMPARISON:  Earlier same day  FINDINGS: Residual barium contrast material is demonstrated throughout the colon to the level of the rectum. Relative paucity of small bowel gas. Right upper quadrant surgical clips. Lumbar spine degenerative change.  IMPRESSION: Residual barium is demonstrated throughout the colon to the level of the rectum.   Electronically Signed   By: Annia Beltrew  Davis M.D.   On: 11/18/2014 17:32    CBC  Recent Labs Lab 11/20/14 0402 11/21/14 0452 11/22/14 0415 11/23/14 0418 11/24/14 0420 11/26/14 0433  WBC 22.6* 26.1* 23.1* 21.6* 25.5* 19.8*  HGB 8.9* 9.6* 9.3* 9.2* 11.3* 10.1*  HCT 27.6* 30.5* 29.5* 29.5* 35.6* 31.7*  PLT 278 275 327 298 357 312  MCV 96.5 97.4 97.4 96.4 96.2 96.1  MCH 31.1 30.7 30.7 30.1 30.5 30.6  MCHC 32.2 31.5 31.5 31.2 31.7 31.9  RDW 13.5 13.7 13.9 13.9 13.9 14.2  LYMPHSABS 1.6  --   --  1.7  --  2.2  MONOABS 1.1*  --   --  1.5*  --  1.6*  EOSABS 0.0  --   --  0.0  --  0.4  BASOSABS 0.0  --   --  0.0  --  0.0    Chemistries   Recent Labs Lab 11/20/14 0402  11/22/14 0415 11/23/14 0418  11/24/14 0420 11/25/14 0858 11/26/14 0433  NA 146  < > 138 145 139 141 140  K 3.1*  < > 3.6 4.2 2.9* 5.3* 4.7  CL 112  < > 112 120* 105 113* 114*  CO2 20  < > 19 19 21 23 20   GLUCOSE 128*  < > 114* 110* 87 107* 86  BUN 18  < > 14 15 15 13 11   CREATININE 1.39*  < > 1.28* 1.23* 1.46* 1.50* 1.39*  CALCIUM 9.4  < > 8.5 8.7 8.6 8.8 8.2*  MG 2.0  --   --   --   --   --  1.6  AST  --   --  29  --  21 16  --   ALT  --   --  26  --  23 16  --   ALKPHOS  --   --  64  --  64 56  --   BILITOT  --   --  0.5  --  0.5 0.5  --   < > = values in this interval not displayed. ------------------------------------------------------------------------------------------------------------------ estimated creatinine clearance is 22.8 mL/min (by C-G formula based on Cr of 1.39). ------------------------------------------------------------------------------------------------------------------ No results for input(s): HGBA1C in the last 72 hours. ------------------------------------------------------------------------------------------------------------------ No results for input(s): CHOL, HDL, LDLCALC, TRIG, CHOLHDL, LDLDIRECT in the last 72 hours. ------------------------------------------------------------------------------------------------------------------ No results for input(s): TSH, T4TOTAL, T3FREE, THYROIDAB in the last 72 hours.  Invalid input(s): FREET3 ------------------------------------------------------------------------------------------------------------------ No results for input(s): VITAMINB12, FOLATE, FERRITIN, TIBC, IRON, RETICCTPCT in the last 72 hours.  Coagulation profile No results for input(s): INR, PROTIME in the last 168 hours.  No results for input(s): DDIMER in the last 72 hours.  Cardiac Enzymes No results for input(s): CKMB, TROPONINI, MYOGLOBIN in the last 168 hours.  Invalid input(s):  CK ------------------------------------------------------------------------------------------------------------------ Invalid input(s): POCBNP    Brigette Hopfer D.O. on 11/26/2014 at 11:04 AM  Between 7am to 7pm - Pager - [phone]  After 7pm go to [email or web address] - password [email or web address]  And look for the night coverage person covering for me after hours  [place]  [phone]

## 2014-11-26 NOTE — Progress Notes (Addendum)
General Surgery Note  LOS: 11 days  POD -  6 Days Post-Op  Assessment/Plan: 1.  Diagnostic laparoscopy with placement of drains - 11/20/2014 - H. Derrell Lollingngram  Had pneumoperitoneum without clear source.  She has 2 drains which have benign appearing serous fluid  CT scan 11/25/2014 - BILATERAL pleural effusions and compressive atelectasis of the posterior lungs.  Postoperative changes of the abdomen without evidence of abscess.  Distal colonic diverticulosis.  Upper normal caliber ascending thoracic aorta   Will removed drains and advance diet.  2.  Compressive atelctasis - L>R  No evidence of pneumonia.   I spoke to Dr. Susie CassetteAbrol who has spoken to one of the daughters. 3.  Bilateral pleural effusions 4.  Hx of AF/CHF/EF 40-45%/AI 5.  Pulmonary hypertension 6.  Hypertension 7.  Chronic renal disease Stage IV  Creat - 1.39 - 11/26/2014 8.  Dementia - currently resident on SNF  9.  Ongoing leukocytosis - WBC 19,800 - 11/26/2014 10.  Anemia - Hgb - 9.2 - 11/23/2014, but today Hgb at 11.3.  11.  DVT prophylaxis - Lovenox   Principal Problem:   SIRS (systemic inflammatory response syndrome) Active Problems:   Atrial fibrillation   CHF (congestive heart failure)   Hypertension   Dementia   CAD (coronary artery disease)   CKD (chronic kidney disease), stage IV   Leukocytosis   Sepsis   Aspiration pneumonia   Acute on chronic renal failure   Dysphagia   Pneumoperitoneum of unknown etiology   Pneumoperitoneum   Supraumbilical hernia   Subjective:  Taking liquids well.    Daughter, Talbert ForestShirley, in room with patient. Objective:   Filed Vitals:   11/26/14 0908  BP: 127/66  Pulse: 55  Temp: 98.9 F (37.2 C)  Resp: 14     Intake/Output from previous day:  12/26 0701 - 12/27 0700 In: 1750 [P.O.:300; I.V.:1200; IV Piggyback:250] Out: 1400 [Urine:1100; Drains:300]  Intake/Output this shift:  Total I/O In: -  Out: 40 [Drains:40]   Physical Exam:   General: Older AA F  who is alert.   HEENT: Normal. Pupils equal. .   Lungs: Poor inspiratory effort.   Abdomen: Soft.  BS present.    Both Blake drains removed without incident.   Lab Results:     Recent Labs  11/24/14 0420 11/26/14 0433  WBC 25.5* 19.8*  HGB 11.3* 10.1*  HCT 35.6* 31.7*  PLT 357 312    BMET    Recent Labs  11/25/14 0858 11/26/14 0433  NA 141 140  K 5.3* 4.7  CL 113* 114*  CO2 23 20  GLUCOSE 107* 86  BUN 13 11  CREATININE 1.50* 1.39*  CALCIUM 8.8 8.2*    PT/INR  No results for input(s): LABPROT, INR in the last 72 hours.  ABG  No results for input(s): PHART, HCO3 in the last 72 hours.  Invalid input(s): PCO2, PO2   Studies/Results:  Ct Abdomen Pelvis Wo Contrast  11/25/2014   CLINICAL DATA:  Postop abdominal laparoscopic surgery, prior colon resection colon resection and cholecystectomy, pneumonia, CHF, atrial fibrillation, hypertension, coronary artery disease, valvular heart disease, stage 4 chronic kidney disease  EXAM: CT CHEST, ABDOMEN AND PELVIS WITHOUT CONTRAST  TECHNIQUE: Multidetector CT imaging of the chest, abdomen and pelvis was performed following the standard protocol without IV contrast. Sagittal and coronal MPR images reconstructed from axial data set.  COMPARISON:  CT abdomen and pelvis 11/19/2014  FINDINGS: CT CHEST FINDINGS  Scattered atherosclerotic calcifications aorta, great vessels and coronary arteries.  Ascending aorta measures 3.9 x 3.6 cm.  No thoracic adenopathy, hilar assessment limited by lack of IV contrast.  BILATERAL pleural effusions and compressive atelectasis of the posterior lungs.  Lungs otherwise clear.  No pneumothorax.  Bones demineralized.  CT ABDOMEN AND PELVIS FINDINGS  Air again identified within the biliary system.  Post cholecystectomy.  Liver, spleen, pancreas, kidneys, and adrenal glands otherwise normal.  Percutaneous drains in the upper abdomen and pelvis.  Retained contrast in colon with distal colonic diverticulosis noted.   Foley catheter decompresses urinary bladder.  Scattered atherosclerotic calcifications.  Normal appendix, uterus, and adnexae.  Bowel loops otherwise normal appearance.  Gastric wall appears thickened proximally though this could be an artifact related to underdistention.  No mass, adenopathy, free fluid or abscess.  Tiny supraumbilical ventral hernia containing fat.  IMPRESSION: BILATERAL pleural effusions and compressive atelectasis of the posterior lungs.  Postoperative changes of the abdomen without evidence of abscess.  Distal colonic diverticulosis.  Upper normal caliber ascending thoracic aorta, recommendation below.  Recommend annual imaging followup by CTA or MRA. This recommendation follows 2010 ACCF/AHA/AATS/ACR/ASA/SCA/SCAI/SIR/STS/SVM Guidelines for the Diagnosis and Management of Patients With Thoracic Aortic Disease. Circulation. 2010; 121: J191-Y782   Electronically Signed   By: Ulyses Southward M.D.   On: 11/25/2014 14:05   Ct Chest Wo Contrast  11/25/2014   CLINICAL DATA:  Postop abdominal laparoscopic surgery, prior colon resection colon resection and cholecystectomy, pneumonia, CHF, atrial fibrillation, hypertension, coronary artery disease, valvular heart disease, stage 4 chronic kidney disease  EXAM: CT CHEST, ABDOMEN AND PELVIS WITHOUT CONTRAST  TECHNIQUE: Multidetector CT imaging of the chest, abdomen and pelvis was performed following the standard protocol without IV contrast. Sagittal and coronal MPR images reconstructed from axial data set.  COMPARISON:  CT abdomen and pelvis 11/19/2014  FINDINGS: CT CHEST FINDINGS  Scattered atherosclerotic calcifications aorta, great vessels and coronary arteries.  Ascending aorta measures 3.9 x 3.6 cm.  No thoracic adenopathy, hilar assessment limited by lack of IV contrast.  BILATERAL pleural effusions and compressive atelectasis of the posterior lungs.  Lungs otherwise clear.  No pneumothorax.  Bones demineralized.  CT ABDOMEN AND PELVIS FINDINGS  Air  again identified within the biliary system.  Post cholecystectomy.  Liver, spleen, pancreas, kidneys, and adrenal glands otherwise normal.  Percutaneous drains in the upper abdomen and pelvis.  Retained contrast in colon with distal colonic diverticulosis noted.  Foley catheter decompresses urinary bladder.  Scattered atherosclerotic calcifications.  Normal appendix, uterus, and adnexae.  Bowel loops otherwise normal appearance.  Gastric wall appears thickened proximally though this could be an artifact related to underdistention.  No mass, adenopathy, free fluid or abscess.  Tiny supraumbilical ventral hernia containing fat.  IMPRESSION: BILATERAL pleural effusions and compressive atelectasis of the posterior lungs.  Postoperative changes of the abdomen without evidence of abscess.  Distal colonic diverticulosis.  Upper normal caliber ascending thoracic aorta, recommendation below.  Recommend annual imaging followup by CTA or MRA. This recommendation follows 2010 ACCF/AHA/AATS/ACR/ASA/SCA/SCAI/SIR/STS/SVM Guidelines for the Diagnosis and Management of Patients With Thoracic Aortic Disease. Circulation. 2010; 121: N562-Z308   Electronically Signed   By: Ulyses Southward M.D.   On: 11/25/2014 14:05     Anti-infectives:   Anti-infectives    Start     Dose/Rate Route Frequency Ordered Stop   11/25/14 1000  vancomycin (VANCOCIN) 500 mg in sodium chloride 0.9 % 100 mL IVPB     500 mg100 mL/hr over 60 Minutes Intravenous Every 24 hours 11/25/14 0953  11/20/14 2200  piperacillin-tazobactam (ZOSYN) IVPB 3.375 g     3.375 g12.5 mL/hr over 240 Minutes Intravenous 3 times per day 11/20/14 1749     11/16/14 2200  vancomycin (VANCOCIN) 500 mg in sodium chloride 0.9 % 100 mL IVPB  Status:  Discontinued     500 mg100 mL/hr over 60 Minutes Intravenous Every 24 hours 11/15/14 1916 11/17/14 1600   11/16/14 0400  piperacillin-tazobactam (ZOSYN) IVPB 3.375 g  Status:  Discontinued     3.375 g12.5 mL/hr over 240 Minutes  Intravenous Every 8 hours 11/15/14 1916 11/20/14 1811   11/15/14 1915  vancomycin (VANCOCIN) IVPB 1000 mg/200 mL premix     1,000 mg200 mL/hr over 60 Minutes Intravenous STAT 11/15/14 1909 11/15/14 2320   11/15/14 1915  piperacillin-tazobactam (ZOSYN) IVPB 3.375 g     3.375 g100 mL/hr over 30 Minutes Intravenous STAT 11/15/14 1909 11/15/14 2011      Ovidio Kinavid Oriah Leinweber, MD, FACS Pager: [phone] [facility] Office: [phone] 11/26/2014

## 2014-11-27 DIAGNOSIS — R531 Weakness: Secondary | ICD-10-CM

## 2014-11-27 DIAGNOSIS — Z515 Encounter for palliative care: Secondary | ICD-10-CM

## 2014-11-27 DIAGNOSIS — Z66 Do not resuscitate: Secondary | ICD-10-CM

## 2014-11-27 MED ORDER — MEMANTINE HCL ER 7 MG PO CP24
14.0000 mg | ORAL_CAPSULE | Freq: Every day | ORAL | Status: DC
  Administered 2014-11-27: 14 mg via ORAL
  Filled 2014-11-27 (×3): qty 2

## 2014-11-27 MED ORDER — MORPHINE SULFATE (CONCENTRATE) 10 MG/0.5ML PO SOLN: 5.0000 mg | ORAL | Status: DC | PRN

## 2014-11-27 MED ORDER — LORAZEPAM 1 MG PO TABS: 1.0000 mg | ORAL_TABLET | ORAL | Status: DC | PRN

## 2014-11-27 MED ORDER — FUROSEMIDE 20 MG PO TABS
20.0000 mg | ORAL_TABLET | Freq: Every day | ORAL | Status: DC
  Filled 2014-11-27: qty 1

## 2014-11-27 MED ORDER — FUROSEMIDE 20 MG PO TABS: 20.0000 mg | ORAL_TABLET | Freq: Every day | ORAL | Status: DC

## 2014-11-27 MED ORDER — DONEPEZIL HCL 10 MG PO TABS
10.0000 mg | ORAL_TABLET | Freq: Every day | ORAL | Status: DC
  Filled 2014-11-27: qty 1

## 2014-11-27 MED ORDER — HYDRALAZINE HCL 50 MG PO TABS
50.0000 mg | ORAL_TABLET | Freq: Three times a day (TID) | ORAL | Status: DC
  Administered 2014-11-27 – 2014-11-28 (×2): 50 mg via ORAL
  Filled 2014-11-27 (×5): qty 1

## 2014-11-27 MED ORDER — METOPROLOL SUCCINATE 12.5 MG HALF TABLET
12.5000 mg | ORAL_TABLET | Freq: Every day | ORAL | Status: DC
  Filled 2014-11-27: qty 1

## 2014-11-27 MED ORDER — MEMANTINE HCL 10 MG PO TABS: 10.0000 mg | ORAL_TABLET | Freq: Every day | ORAL | Status: DC

## 2014-11-27 NOTE — Progress Notes (Signed)
MD notified of pts bp being 126/54 and pulse 59. Order to hold metoprolol and give iv lasix at this time Kristi [name], Kristi [name]

## 2014-11-27 NOTE — Progress Notes (Signed)
CSW continuing to follow.   CSW received referral for residential hospice placement.  CSW met with pt and pt daughter, Abner Greenspan at bedside. Pt has dementia and unable to participate in assessment.  CSW introduced self and explained role.   CSW discussed with pt daughter recommendation for residential hospice placement. Pt daughter expressed understanding. CSW provided residential hospice choice and pt daughter's first choice is [facility] and second choice is [facility]. CSW explained process of residential hospice referrals and clarified questions.   CSW made referral to [facility], Erling Conte.  CSW made referral to [facility] liaison, Orbie Pyo.  CSW to follow up with residential hospice facilities regarding referral.  CSW to continue to follow to provide support and assist with pt discharge planning needs.  Alison Murray, MSW, [place] [facility] [phone]

## 2014-11-27 NOTE — Progress Notes (Signed)
Pt refused lab draws this am. Lab will try again later.

## 2014-11-27 NOTE — Progress Notes (Signed)
7 Days Post-Op  Subjective: She won't let me look at incisions, says she is eating.  No complaints aside from not wanting to be examined.    Objective: Vital signs in last 24 hours: Temp:  [97.6 F (36.4 C)-98.8 F (37.1 C)] 98.7 F (37.1 C) (12/28 0555) Pulse Rate:  [59-69] 61 (12/28 0555) Resp:  [16-20] 20 (12/28 0555) BP: (114-131)/(54-70) 131/62 mmHg (12/28 0555) SpO2:  [96 %-100 %] 99 % (12/28 0901) Last BM Date: 11/26/14 460 PO recorded yesterday 5 stools Regular diet Afebrile, VSS WBC still up 129.8 yesteday c diff negative on 11/19/14 Intake/Output from previous day: 12/27 0701 - 12/28 0700 In: 1660 [P.O.:460; I.V.:1200] Out: 2520 [Urine:2450; Drains:70] Intake/Output this shift:    General appearance: alert, no distress, uncooperative and doesn't want me looking at abdomen GI: soft, non-tender; bowel sounds normal; no masses,  no organomegaly  Lab Results:   Recent Labs  11/26/14 0433  WBC 19.8*  HGB 10.1*  HCT 31.7*  PLT 312    BMET  Recent Labs  11/25/14 0858 11/26/14 0433  NA 141 140  K 5.3* 4.7  CL 113* 114*  CO2 23 20  GLUCOSE 107* 86  BUN 13 11  CREATININE 1.50* 1.39*  CALCIUM 8.8 8.2*   PT/INR No results for input(s): LABPROT, INR in the last 72 hours.   Recent Labs Lab 11/22/14 0415 11/24/14 0420 11/25/14 0858  AST 29 21 16   ALT 26 23 16   ALKPHOS 64 64 56  BILITOT 0.5 0.5 0.5  PROT 6.1 6.4 6.2  ALBUMIN 2.5* 2.8* 2.8*     Lipase     Component Value Date/Time   LIPASE 23 11/22/2014 0415     Studies/Results: Ct Abdomen Pelvis Wo Contrast  11/25/2014   CLINICAL DATA:  Postop abdominal laparoscopic surgery, prior colon resection colon resection and cholecystectomy, pneumonia, CHF, atrial fibrillation, hypertension, coronary artery disease, valvular heart disease, stage 4 chronic kidney disease  EXAM: CT CHEST, ABDOMEN AND PELVIS WITHOUT CONTRAST  TECHNIQUE: Multidetector CT imaging of the chest, abdomen and pelvis was  performed following the standard protocol without IV contrast. Sagittal and coronal MPR images reconstructed from axial data set.  COMPARISON:  CT abdomen and pelvis 11/19/2014  FINDINGS: CT CHEST FINDINGS  Scattered atherosclerotic calcifications aorta, great vessels and coronary arteries.  Ascending aorta measures 3.9 x 3.6 cm.  No thoracic adenopathy, hilar assessment limited by lack of IV contrast.  BILATERAL pleural effusions and compressive atelectasis of the posterior lungs.  Lungs otherwise clear.  No pneumothorax.  Bones demineralized.  CT ABDOMEN AND PELVIS FINDINGS  Air again identified within the biliary system.  Post cholecystectomy.  Liver, spleen, pancreas, kidneys, and adrenal glands otherwise normal.  Percutaneous drains in the upper abdomen and pelvis.  Retained contrast in colon with distal colonic diverticulosis noted.  Foley catheter decompresses urinary bladder.  Scattered atherosclerotic calcifications.  Normal appendix, uterus, and adnexae.  Bowel loops otherwise normal appearance.  Gastric wall appears thickened proximally though this could be an artifact related to underdistention.  No mass, adenopathy, free fluid or abscess.  Tiny supraumbilical ventral hernia containing fat.  IMPRESSION: BILATERAL pleural effusions and compressive atelectasis of the posterior lungs.  Postoperative changes of the abdomen without evidence of abscess.  Distal colonic diverticulosis.  Upper normal caliber ascending thoracic aorta, recommendation below.  Recommend annual imaging followup by CTA or MRA. This recommendation follows 2010 ACCF/AHA/AATS/ACR/ASA/SCA/SCAI/SIR/STS/SVM Guidelines for the Diagnosis and Management of Patients With Thoracic Aortic Disease. Circulation. 2010; 121: [id number]  Electronically Signed   By: Ulyses SouthwardMark  Boles M.D.   On: 11/25/2014 14:05   Ct Chest Wo Contrast  11/25/2014   CLINICAL DATA:  Postop abdominal laparoscopic surgery, prior colon resection colon resection and  cholecystectomy, pneumonia, CHF, atrial fibrillation, hypertension, coronary artery disease, valvular heart disease, stage 4 chronic kidney disease  EXAM: CT CHEST, ABDOMEN AND PELVIS WITHOUT CONTRAST  TECHNIQUE: Multidetector CT imaging of the chest, abdomen and pelvis was performed following the standard protocol without IV contrast. Sagittal and coronal MPR images reconstructed from axial data set.  COMPARISON:  CT abdomen and pelvis 11/19/2014  FINDINGS: CT CHEST FINDINGS  Scattered atherosclerotic calcifications aorta, great vessels and coronary arteries.  Ascending aorta measures 3.9 x 3.6 cm.  No thoracic adenopathy, hilar assessment limited by lack of IV contrast.  BILATERAL pleural effusions and compressive atelectasis of the posterior lungs.  Lungs otherwise clear.  No pneumothorax.  Bones demineralized.  CT ABDOMEN AND PELVIS FINDINGS  Air again identified within the biliary system.  Post cholecystectomy.  Liver, spleen, pancreas, kidneys, and adrenal glands otherwise normal.  Percutaneous drains in the upper abdomen and pelvis.  Retained contrast in colon with distal colonic diverticulosis noted.  Foley catheter decompresses urinary bladder.  Scattered atherosclerotic calcifications.  Normal appendix, uterus, and adnexae.  Bowel loops otherwise normal appearance.  Gastric wall appears thickened proximally though this could be an artifact related to underdistention.  No mass, adenopathy, free fluid or abscess.  Tiny supraumbilical ventral hernia containing fat.  IMPRESSION: BILATERAL pleural effusions and compressive atelectasis of the posterior lungs.  Postoperative changes of the abdomen without evidence of abscess.  Distal colonic diverticulosis.  Upper normal caliber ascending thoracic aorta, recommendation below.  Recommend annual imaging followup by CTA or MRA. This recommendation follows 2010 ACCF/AHA/AATS/ACR/ASA/SCA/SCAI/SIR/STS/SVM Guidelines for the Diagnosis and Management of Patients With  Thoracic Aortic Disease. Circulation. 2010; 121: Z610-R604: [id number]   Electronically Signed   By: Ulyses SouthwardMark  Boles M.D.   On: 11/25/2014 14:05    Medications: . amLODipine  10 mg Oral Daily  . antiseptic oral rinse  7 mL Mouth Rinse q12n4p  . budesonide (PULMICORT) nebulizer solution  0.25 mg Nebulization BID  . chlorhexidine  15 mL Mouth Rinse BID  . cloNIDine  0.3 mg Transdermal Weekly  . enoxaparin (LOVENOX) injection  30 mg Subcutaneous Q24H  . hydrALAZINE  100 mg Oral 3 times per day  . latanoprost  1 drop Both Eyes QHS  . metoprolol  5 mg Intravenous 4 times per day  . metoprolol succinate  25 mg Oral Daily    Assessment/Plan Pneumoperitoneum primarily within the retroperitoneum, pneumobilia of uncertain etiology Diagnostic laparoscopy of abdomen and pelvis, placement of drains, 11/20/2014,  Claud KelpHaywood Ingram, MD. Community acquired Pneumonia/fever/Compressive atelctasis - L>R Bilateral pleural effusions Hx of AF/CHF/EF 40-45%/AI Pulmonary hypertension Hypertension Chronic renal disease Stage IV Dementia/Noncompliance/currently resident on SNF  Leukocytosis of uncertain etiology DNR    Plan:  She is very unhappy and would not let me pull covers up to look at her abdomen. I was not going to do it without a fight.  If she has any staples left you can pull out on 11/30/14.  They are working on Hospice placement.  She can follow up with Dr. Derrell LollingIngram in 2 weeks after discharge.     I have put some follow up info in AVS, please call if we can be of assistance.    LOS: 12 days    Kristi [name] 11/27/2014

## 2014-11-27 NOTE — Progress Notes (Signed)
Patient BP 115/58 with heart rate of 60.  Morning BP medications of Toprol and Norvasc help at this time.  Dr Susie CassetteAbrol text paged information.  Instructed to give 12.5 Toprol and discontinue Norvasc.  Orders repeated back to physician for verbal confirmation.

## 2014-11-27 NOTE — Progress Notes (Addendum)
[facility]                                                                              [name], is a 80 y.o. female, DOB - 1931-02-13, WUJ:[id number]  Admit date - 11/15/2014   Admitting Physician Ron ParkerHarvette C Jenkins, MD  Outpatient Primary MD for the patient is No primary care provider on file.  LOS - 12   Chief Complaint  Patient presents with  . Shaking           Assessment & Plan   Sepsis secondary to HCAP/aspiration pneumonia vs peritonitis Persistent leukocytosis, afebrile,Pt refused lab draws this am -Repeat chest x-ray 11/18/2014 showed RLL consolidation, however CT scan 12/26 did not show a pneumonia just bilateral pleural effusions, no intra-abdominal abscess -Influenza PCR negative Discontinued  Zosyn, and vancomycin  -Blood cultures negative from 11/15/2014 -Urinalysis also negative   Shortness of breath Could be a combination of right lower lobe pneumonia secondary to aspiration vs volume overload secondary to aggressive IV hydration We have discontinued IV fluids Patient more comfortable after receiving 2 doses of IV Lasix, continue low-dose Lasix as CT scan shows bilateral pleural effusions   Pneumoperitoneum -Diagnostic laparoscopy with placement of drains - 11/20/2014 - H. Derrell Lollingngram Had pneumoperitoneum without clear source. She had 2 drains which have benign appearing serous fluid, they were removed 12/27 On regular diet.   repeat CT scan today of abdomen/pelvis. No evidence of abscess, removal drains per surgery and advance diet Does not want to be examined   Acute on chronic renal failure, stage III -Baseline creatinine appears to be 1.1-1.4, patient refusing labs Creatinine slightly today probably because of the Lasix given 12/24, will not restart fluid -Continue to monitor BMP, unless patient wants to discontinue monitoring and wants to be made  comfortable    Atrial fibrillation -Currently rate controlled Started on Toprol-XL Discontinue IV metoprolol   Hypertension Restarted Toprol and Norvasc Continue by mouth hydralazine, clonidine    Dysphagia with history of esophageal stricture -Barium esophagogram was negative for stricture shows esophageal dysmotility  Dementia restart Aricept and Namenda  Chronic diastolic heart failure -Currently appears euvolemic and compensated -Lasix restarted -Continue to monitor daily weights, intake and output  Hypernatremia/hypokalemia -Resolved likely secondary to volume depletion Replete potassium orally  Normocytic anemia -Hemoglobin appears to be stable, hemoglobin 11.3 -Continue to monitor CBC  Code Status: DO NOT RESUSCITATE,  Family Communication: called patient and updated her Ricardo JerichoSutton,Shirley Daughter [phone]  She would like to be conservative in terms of treatment  Palliative care consultation ordered, pending  Disposition Plan: Palliative care consultation, to discuss goals of care, patient really not a candidate for any aggressive intervention   Time Spent in minutes   30 minutes  Procedures  Laparoscopy 11/20/2014  Consults   Gen. surgery  DVT Prophylaxis  Lovenox  Lab Results  Component Value Date   PLT 312 11/26/2014    Medications  Scheduled Meds: . antiseptic oral rinse  7 mL Mouth Rinse q12n4p  . budesonide (PULMICORT) nebulizer solution  0.25 mg Nebulization BID  . chlorhexidine  15 mL Mouth Rinse BID  . cloNIDine  0.3 mg Transdermal Weekly  .  hydrALAZINE  100 mg Oral 3 times per day  . latanoprost  1 drop Both Eyes QHS  . [START ON 11/28/2014] metoprolol succinate  12.5 mg Oral Daily   Continuous Infusions: . 0.9 % sodium chloride with kcl 50 mL/hr at 11/27/14 0403   PRN Meds:.hydrALAZINE, HYDROmorphone (DILAUDID) injection, LORazepam, morphine CONCENTRATE, ondansetron **OR** ondansetron (ZOFRAN) IV  Antibiotics     Anti-infectives    Start     Dose/Rate Route Frequency Ordered Stop   11/25/14 1000  vancomycin (VANCOCIN) 500 mg in sodium chloride 0.9 % 100 mL IVPB  Status:  Discontinued     500 mg100 mL/hr over 60 Minutes Intravenous Every 24 hours 11/25/14 0953 11/26/14 1107   11/20/14 2200  piperacillin-tazobactam (ZOSYN) IVPB 3.375 g  Status:  Discontinued     3.375 g12.5 mL/hr over 240 Minutes Intravenous 3 times per day 11/20/14 1749 11/26/14 1107   11/16/14 2200  vancomycin (VANCOCIN) 500 mg in sodium chloride 0.9 % 100 mL IVPB  Status:  Discontinued     500 mg100 mL/hr over 60 Minutes Intravenous Every 24 hours 11/15/14 1916 11/17/14 1600   11/16/14 0400  piperacillin-tazobactam (ZOSYN) IVPB 3.375 g  Status:  Discontinued     3.375 g12.5 mL/hr over 240 Minutes Intravenous Every 8 hours 11/15/14 1916 11/20/14 1811   11/15/14 1915  vancomycin (VANCOCIN) IVPB 1000 mg/200 mL premix     1,000 mg200 mL/hr over 60 Minutes Intravenous STAT 11/15/14 1909 11/15/14 2320   11/15/14 1915  piperacillin-tazobactam (ZOSYN) IVPB 3.375 g     3.375 g100 mL/hr over 30 Minutes Intravenous STAT 11/15/14 1909 11/15/14 2011        Subjective:   Kristi [name] seen and examined today.   "I just do not feel well", cannot specify she is nauseous, denies any abdominal pain   Objective:   Filed Vitals:   11/27/14 0555 11/27/14 0901 11/27/14 1000 11/27/14 1047  BP: 131/62  115/58 115/58  Pulse: 61  60 60  Temp: 98.7 F (37.1 C)     TempSrc: Axillary     Resp: 20     Height:      Weight:      SpO2: 99% 99%      Wt Readings from Last 3 Encounters:  11/26/14 52.708 kg (116 lb 3.2 oz)  12/20/11 50.758 kg (111 lb 14.4 oz)  09/03/11 46.72 kg (103 lb)     Intake/Output Summary (Last 24 hours) at 11/27/14 1329 Last data filed at 11/27/14 1200  Gross per 24 hour  Intake   1980 ml  Output   2450 ml  Net   -470 ml    Exam  General: Well developed, NAD, appears stated age  HEENT: NCAT, mucous  membranes moist.   Cardiovascular: S1 S2 auscultated, Regular rate and rhythm.  Respiratory: Clear to auscultation bilaterally with equal chest rise  Abdomen: Soft, nontender, nondistended, + bowel sounds, JP drains in place-serosanguineous fluid  Extremities: warm dry without cyanosis clubbing or edema  Neuro: Awake and alert, oriented to self, no focal deficits, follows commands  Data Review   Micro Results Recent Results (from the past 240 hour(s))  Clostridium Difficile by PCR     Status: None   Collection Time: 11/19/14  6:59 PM  Result Value Ref Range Status   C difficile by pcr NEGATIVE NEGATIVE Final    Comment: Performed at [facility]  Surgical pcr screen     Status: Abnormal   Collection Time: 11/20/14  1:32 PM  Result Value Ref Range Status   MRSA, PCR POSITIVE (A) NEGATIVE Final    Comment: RESULT CALLED TO, READ BACK BY AND VERIFIED WITH: Reymundo Poll RN 2130 11/20/14 [place] V    Staphylococcus aureus POSITIVE (A) NEGATIVE Final    Comment:        The Xpert SA Assay (FDA approved for NASAL specimens in patients over 60 years of age), is one component of a comprehensive surveillance program.  Test performance has been validated by [employer] for patients greater than or equal to 5 year old. It is not intended to diagnose infection nor to guide or monitor treatment.     Radiology Reports Ct Abdomen Pelvis Wo Contrast  11/25/2014   CLINICAL DATA:  Postop abdominal laparoscopic surgery, prior colon resection colon resection and cholecystectomy, pneumonia, CHF, atrial fibrillation, hypertension, coronary artery disease, valvular heart disease, stage 4 chronic kidney disease  EXAM: CT CHEST, ABDOMEN AND PELVIS WITHOUT CONTRAST  TECHNIQUE: Multidetector CT imaging of the chest, abdomen and pelvis was performed following the standard protocol without IV contrast. Sagittal and coronal MPR images reconstructed from axial data set.  COMPARISON:  CT  abdomen and pelvis 11/19/2014  FINDINGS: CT CHEST FINDINGS  Scattered atherosclerotic calcifications aorta, great vessels and coronary arteries.  Ascending aorta measures 3.9 x 3.6 cm.  No thoracic adenopathy, hilar assessment limited by lack of IV contrast.  BILATERAL pleural effusions and compressive atelectasis of the posterior lungs.  Lungs otherwise clear.  No pneumothorax.  Bones demineralized.  CT ABDOMEN AND PELVIS FINDINGS  Air again identified within the biliary system.  Post cholecystectomy.  Liver, spleen, pancreas, kidneys, and adrenal glands otherwise normal.  Percutaneous drains in the upper abdomen and pelvis.  Retained contrast in colon with distal colonic diverticulosis noted.  Foley catheter decompresses urinary bladder.  Scattered atherosclerotic calcifications.  Normal appendix, uterus, and adnexae.  Bowel loops otherwise normal appearance.  Gastric wall appears thickened proximally though this could be an artifact related to underdistention.  No mass, adenopathy, free fluid or abscess.  Tiny supraumbilical ventral hernia containing fat.  IMPRESSION: BILATERAL pleural effusions and compressive atelectasis of the posterior lungs.  Postoperative changes of the abdomen without evidence of abscess.  Distal colonic diverticulosis.  Upper normal caliber ascending thoracic aorta, recommendation below.  Recommend annual imaging followup by CTA or MRA. This recommendation follows 2010 ACCF/AHA/AATS/ACR/ASA/SCA/SCAI/SIR/STS/SVM Guidelines for the Diagnosis and Management of Patients With Thoracic Aortic Disease. Circulation. 2010; 121: Q657-Q469   Electronically Signed   By: Ulyses Southward M.D.   On: 11/25/2014 14:05   Ct Abdomen Pelvis Wo Contrast  11/19/2014   CLINICAL DATA:  Fever and chills. Leukocytosis with presumed aspiration pneumonia. History of coronary artery disease, dementia and renal insufficiency. Initial encounter.  EXAM: CT ABDOMEN AND PELVIS WITHOUT CONTRAST  TECHNIQUE: Multidetector CT  imaging of the abdomen and pelvis was performed following the standard protocol without IV contrast.  COMPARISON:  Chest and abdominal radiographs 11/18/2014. Report from abdominal CT 01/29/2001.  FINDINGS: Lower chest: There are small to moderate dependent pleural effusions bilaterally with associated dependent bibasilar airspace opacities. These are most consistent with atelectasis. There is no consolidation or significant pericardial effusion. Extensive atherosclerosis of the coronary arteries and thoracic aorta noted. The heart is enlarged.  Hepatobiliary: The liver demonstrates decreased density consistent with steatosis. No focal lesions are identified on non contrast imaging. The gallbladder is surgically absent. There is air within the extrahepatic and left intrahepatic biliary system.  Pancreas:  Unremarkable on noncontrast imaging.  Spleen: Normal in size without focal abnormality.  Adrenals/Urinary Tract: Both adrenal glands are somewhat indistinct without focal mass or hematoma.The left kidney demonstrates asymmetric cortical thinning and atrophy. There is some extraluminal gas around the left renal hilum, but no definite air within its collecting system. There is no evidence of hydronephrosis or ureteral calculus. The bladder appears unremarkable.  Stomach/Bowel: Contrast from recent esophagram is present within the colon. There is no colonic wall thickening or extravasated enteric contrast. Diverticular changes are present throughout the colon. There is no definite bowel wall thickening or focal surrounding inflammation. However, multiple extraluminal air bubbles are present. These are most pronounced within the right upper quadrant, inferior to the cholecystectomy bed. Extraluminal air tracks inferiorly along the retroperitoneum. There are several extraluminal air bubbles within the pelvis.There is no free intraperitoneal air or drainable fluid collection.  Vascular/Lymphatic: There are no enlarged  abdominal or pelvic lymph nodes. Aortoiliac atherosclerosis noted.  Reproductive: There are vascular calcifications within the uterus. No evidence of adnexal mass.  Other: There is generalized soft tissue edema affecting the subcutaneous and mesenteric fat. No abdominal wall hernia identified.  Musculoskeletal: No acute or significant osseous findings. There are degenerative changes throughout the spine with a grade 1 anterolisthesis at the lower 3 lumbar disc space levels.  IMPRESSION: 1. Multiple extraluminal air collections are present within the abdomen and pelvis, primarily within the retroperitoneum, consistent with a perforated viscus. Because most of the air is in the right upper quadrant, this could be due to a perforated duodenal ulcer. Alternatively, findings could be secondary to perforated sigmoid diverticulitis. There is no free air or drainable fluid collection. 2. Pneumobilia, probably related to previous sphincterotomy or biliary bypass. 3. Bilateral pleural effusions with dependent opacities at both lung bases. No definite aspiration pneumonia demonstrated by this examination. 4. These results were called by telephone at the time of interpretation on 11/19/2014 at 7:50 pm to the patient's nurse, Tinnie GensJennie, who verbally acknowledged these results.   Electronically Signed   By: Roxy HorsemanBill  Veazey M.D.   On: 11/19/2014 19:53   Dg Chest 2 View  11/18/2014   CLINICAL DATA:  Shortness of breath  EXAM: CHEST  2 VIEW  COMPARISON:  Chest radiograph 11/15/2014  FINDINGS: Stable enlarged cardiac and mediastinal contours with tortuosity and calcification of thoracic aorta. Interval development of focal heterogeneous opacity within the right lung base. Thoracic spine degenerative change.  IMPRESSION: New focal consolidative opacity within the peripheral right lower lobe, concerning for infection in the appropriate clinical setting. Atelectasis would be an alternative consideration. Recommend short term radiographic  followup to ensure resolution.   Electronically Signed   By: Annia Beltrew  Davis M.D.   On: 11/18/2014 12:11   Dg Chest 2 View  11/15/2014   CLINICAL DATA:  80 year old female with abnormal shaking, chills. Initial encounter.  EXAM: CHEST  2 VIEW  COMPARISON:  12/17/2011.  FINDINGS: Low lung volumes. Chronic cardiomegaly. Stable cardiac size and mediastinal contours. No pneumothorax or pulmonary edema. No pleural effusion or consolidation. Eventration of the diaphragm re - identified. No acute osseous abnormality identified. Stable cholecystectomy clips.  IMPRESSION: Low lung volumes, otherwise no acute cardiopulmonary abnormality.   Electronically Signed   By: Augusto GambleLee  Hall M.D.   On: 11/15/2014 16:35   Ct Chest Wo Contrast  11/25/2014   CLINICAL DATA:  Postop abdominal laparoscopic surgery, prior colon resection colon resection and cholecystectomy, pneumonia, CHF, atrial fibrillation, hypertension, coronary artery disease, valvular heart disease, stage 4 chronic kidney disease  EXAM:  CT CHEST, ABDOMEN AND PELVIS WITHOUT CONTRAST  TECHNIQUE: Multidetector CT imaging of the chest, abdomen and pelvis was performed following the standard protocol without IV contrast. Sagittal and coronal MPR images reconstructed from axial data set.  COMPARISON:  CT abdomen and pelvis 11/19/2014  FINDINGS: CT CHEST FINDINGS  Scattered atherosclerotic calcifications aorta, great vessels and coronary arteries.  Ascending aorta measures 3.9 x 3.6 cm.  No thoracic adenopathy, hilar assessment limited by lack of IV contrast.  BILATERAL pleural effusions and compressive atelectasis of the posterior lungs.  Lungs otherwise clear.  No pneumothorax.  Bones demineralized.  CT ABDOMEN AND PELVIS FINDINGS  Air again identified within the biliary system.  Post cholecystectomy.  Liver, spleen, pancreas, kidneys, and adrenal glands otherwise normal.  Percutaneous drains in the upper abdomen and pelvis.  Retained contrast in colon with distal colonic  diverticulosis noted.  Foley catheter decompresses urinary bladder.  Scattered atherosclerotic calcifications.  Normal appendix, uterus, and adnexae.  Bowel loops otherwise normal appearance.  Gastric wall appears thickened proximally though this could be an artifact related to underdistention.  No mass, adenopathy, free fluid or abscess.  Tiny supraumbilical ventral hernia containing fat.  IMPRESSION: BILATERAL pleural effusions and compressive atelectasis of the posterior lungs.  Postoperative changes of the abdomen without evidence of abscess.  Distal colonic diverticulosis.  Upper normal caliber ascending thoracic aorta, recommendation below.  Recommend annual imaging followup by CTA or MRA. This recommendation follows 2010 ACCF/AHA/AATS/ACR/ASA/SCA/SCAI/SIR/STS/SVM Guidelines for the Diagnosis and Management of Patients With Thoracic Aortic Disease. Circulation. 2010; 121: Z610-R604   Electronically Signed   By: Ulyses Southward M.D.   On: 11/25/2014 14:05   Dg Esophagus  11/18/2014   CLINICAL DATA:  PT C/O DYSPHAGIA, N/V W/MEALS, FAMILY REPORTS H/O ESOPHAGEAL STRICTURE WHICH REQUIRED DILATION "A LONG TIME AGO"  EXAM: ESOPHOGRAM/BARIUM SWALLOW  TECHNIQUE: Single contrast examination was performed using  thin barium.  FLUOROSCOPY TIME:  1 min and 34 seconds  COMPARISON:  None.  FINDINGS: Pharyngeal swallowing function is grossly normal with no aspiration visualized.  Esophagus is normal in course. There is a small sliding hiatal hernia. There is no convincing stricture. No esophageal mass and no evidence of esophagitis. Study was somewhat limited by patient's probability to only take small swallows.  There is esophageal dysmotility with significant tertiary contractions and barium stasis. No reflux was documented during the exam.  IMPRESSION: 1. Esophageal dysmotility. 2. No convincing mass or stricture. 3. Small hiatal hernia.   Electronically Signed   By: Amie Portland M.D.   On: 11/18/2014 12:39   Dg Chest  Port 1 View  11/23/2014   CLINICAL DATA:  CHF.  Chest pain  EXAM: PORTABLE CHEST - 1 VIEW  COMPARISON:  11/18/2014  FINDINGS: There is moderate cardiac enlargement. Atherosclerotic plaque is noted within the aortic arch. There is a small left pleural effusion. Increase in interstitial edema. Multifocal areas of subsegmental atelectasis noted bilaterally.  IMPRESSION: 1. Increase in CHF pattern.   Electronically Signed   By: Signa Kell M.D.   On: 11/23/2014 10:43   Dg Abd Portable 1v  11/18/2014   CLINICAL DATA:  Evaluate for clearance of barium from esophagram. Patient scheduled for CT.  EXAM: PORTABLE ABDOMEN - 1 VIEW  COMPARISON:  Earlier same day  FINDINGS: Residual barium contrast material is demonstrated throughout the colon to the level of the rectum. Relative paucity of small bowel gas. Right upper quadrant surgical clips. Lumbar spine degenerative change.  IMPRESSION: Residual barium is demonstrated throughout the  colon to the level of the rectum.   Electronically Signed   By: Annia Belt M.D.   On: 11/18/2014 17:32    CBC  Recent Labs Lab 11/21/14 0452 11/22/14 0415 11/23/14 0418 11/24/14 0420 11/26/14 0433  WBC 26.1* 23.1* 21.6* 25.5* 19.8*  HGB 9.6* 9.3* 9.2* 11.3* 10.1*  HCT 30.5* 29.5* 29.5* 35.6* 31.7*  PLT 275 327 298 357 312  MCV 97.4 97.4 96.4 96.2 96.1  MCH 30.7 30.7 30.1 30.5 30.6  MCHC 31.5 31.5 31.2 31.7 31.9  RDW 13.7 13.9 13.9 13.9 14.2  LYMPHSABS  --   --  1.7  --  2.2  MONOABS  --   --  1.5*  --  1.6*  EOSABS  --   --  0.0  --  0.4  BASOSABS  --   --  0.0  --  0.0    Chemistries   Recent Labs Lab 11/22/14 0415 11/23/14 0418 11/24/14 0420 11/25/14 0858 11/26/14 0433  NA 138 145 139 141 140  K 3.6 4.2 2.9* 5.3* 4.7  CL 112 120* 105 113* 114*  CO2 19 19 21 23 20   GLUCOSE 114* 110* 87 107* 86  BUN 14 15 15 13 11   CREATININE 1.28* 1.23* 1.46* 1.50* 1.39*  CALCIUM 8.5 8.7 8.6 8.8 8.2*  MG  --   --   --   --  1.6  AST 29  --  21 16  --   ALT 26   --  23 16  --   ALKPHOS 64  --  64 56  --   BILITOT 0.5  --  0.5 0.5  --    ------------------------------------------------------------------------------------------------------------------ estimated creatinine clearance is 22.8 mL/min (by C-G formula based on Cr of 1.39). ------------------------------------------------------------------------------------------------------------------ No results for input(s): HGBA1C in the last 72 hours. ------------------------------------------------------------------------------------------------------------------ No results for input(s): CHOL, HDL, LDLCALC, TRIG, CHOLHDL, LDLDIRECT in the last 72 hours. ------------------------------------------------------------------------------------------------------------------ No results for input(s): TSH, T4TOTAL, T3FREE, THYROIDAB in the last 72 hours.  Invalid input(s): FREET3 ------------------------------------------------------------------------------------------------------------------ No results for input(s): VITAMINB12, FOLATE, FERRITIN, TIBC, IRON, RETICCTPCT in the last 72 hours.  Coagulation profile No results for input(s): INR, PROTIME in the last 168 hours.  No results for input(s): DDIMER in the last 72 hours.  Cardiac Enzymes No results for input(s): CKMB, TROPONINI, MYOGLOBIN in the last 168 hours.  Invalid input(s): CK ------------------------------------------------------------------------------------------------------------------ Invalid input(s): POCBNP    Kristi [name] D.O. on 11/27/2014 at 1:29 PM  Between 7am to 7pm - Pager - [phone]  After 7pm go to [email or web address] - password [email or web address]  And look for the night coverage person covering for me after hours  [facility] Office  [phone]

## 2014-11-27 NOTE — Discharge Instructions (Signed)
[facility] ______[facility] LAPAROSCOPIC SURGERY: POST OP INSTRUCTIONS  Staples can be removed if any remain on 11/30/14.  She can be bathed with soap and water to all sites.. Always review your discharge instruction sheet given to you by the facility where your surgery was performed. IF YOU HAVE DISABILITY OR FAMILY LEAVE FORMS, YOU MUST BRING THEM TO THE OFFICE FOR PROCESSING.   DO NOT GIVE THEM TO YOUR DOCTOR.  1. A prescription for pain medication may be given to you upon discharge.  Take your pain medication as prescribed, if needed.  If narcotic pain medicine is not needed, then you may take acetaminophen (Tylenol) or ibuprofen (Advil) as needed. 2. Take your usually prescribed medications unless otherwise directed. 3. If you need a refill on your pain medication, please contact your pharmacy.  They will contact our office to request authorization. Prescriptions will not be filled after 5pm or on week-ends. 4. You should follow a light diet the first few days after arrival home, such as soup and crackers, etc.  Be sure to include lots of fluids daily. 5. Most patients will experience some swelling and bruising in the area of the incisions.  Ice packs will help.  Swelling and bruising can take several days to resolve.  6. It is common to experience some constipation if taking pain medication after surgery.  Increasing fluid intake and taking a stool softener (such as Colace) will usually help or prevent this problem from occurring.  A mild laxative (Milk of Magnesia or Miralax) should be taken according to package instructions if there are no bowel movements after 48 hours. 7. Unless discharge instructions indicate otherwise, you may remove your bandages 24-48 hours after surgery, and you may shower at that time.  You may have steri-strips (small skin tapes) in place directly over the incision.  These strips should be left on the skin for 7-10 days.  If your surgeon used skin glue on  the incision, you may shower in 24 hours.  The glue will flake off over the next 2-3 weeks.  Any sutures or staples will be removed at the office during your follow-up visit. 8. ACTIVITIES:  You may resume regular (light) daily activities beginning the next day--such as daily self-care, walking, climbing stairs--gradually increasing activities as tolerated.  You may have sexual intercourse when it is comfortable.  Refrain from any heavy lifting or straining until approved by your doctor. a. You may drive when you are no longer taking prescription pain medication, you can comfortably wear a seatbelt, and you can safely maneuver your car and apply brakes. b. RETURN TO WORK:  __________________________________________________________ 9. You should see your doctor in the office for a follow-up appointment approximately 2-3 weeks after your surgery.  Make sure that you call for this appointment within a day or two after you arrive home to insure a convenient appointment time. 10. OTHER INSTRUCTIONS: __________________________________________________________________________________________________________________________ __________________________________________________________________________________________________________________________ WHEN TO CALL YOUR DOCTOR: 1. Fever over 101.0 2. Inability to urinate 3. Continued bleeding from incision. 4. Increased pain, redness, or drainage from the incision. 5. Increasing abdominal pain  The clinic staff is available to answer your questions during regular business hours.  Please dont hesitate to call and ask to speak to one of the nurses for clinical concerns.  If you have a medical emergency, go to the nearest emergency room or call 911.  [facility] always on call at the hospital. [address], [address], [place],  Kristi [name]  [id number] ? [address] [address], [place], [place]   [id number] [phone] ? [phone] ? [phone]  [phone] Web site: [email or web address]

## 2014-11-27 NOTE — Consult Note (Signed)
Patient WU:[id number] Kristi [name]      DOB: [birth year]      GNF:[id number]     Consult Note from the Palliative Medicine Team at [facility]    Consult Requested by: Dr Susie Cassette    PCP: No primary care provider on file. Reason for Consultation:Clarification of GOC and options     Phone Number:None  Assessment of patients Current state:    Continued physical, functional and cognitive decline 2/2 to ES-dementia, CKD, CAD, leukocytosis, s/p diagnostic laparoscopy, bilateral pleural effusions and overall failure to thrive. Family is faced with advanced directive decisions and anticipatory care needs  Consult is for review of medical treatment options, clarification of goals of care and end of life issues, disposition and options, and symptom recommendation.  This NP Lorinda Creed reviewed medical records, received report from team, assessed the patient and then spoke by telephone to her daughter Justice Deeds and Claudine Mouton to discuss diagnosis prognosis, GOC, EOL wishes disposition and options.  A detailed discussion was had today regarding advanced directives.  Concepts specific to code status, artifical feeding and hydration, continued IV antibiotics and rehospitalization was had.  The difference between a aggressive medical intervention path  and a palliative comfort care path for this patient at this time was had.  Values and goals of care important to patient and family were attempted to be elicited.  Concept of Hospice and Palliative Care were discussed  Natural trajectory and expectations at EOL were discussed.  Questions and concerns addressed.  Hard Choices booklet left for review. Family encouraged to call with questions or concerns.  PMT will continue to support holistically.   Goals of Care: 1.  Code Status: DNR/DNI-previously documented   2. Scope of Treatment: Focus of care is full comfort, no further diagnostics, artificial feeding or hydration, Iv medications or  rehospitalization  3. Disposition:  Hopeful for hospice facility.  With focus on full  comfort expect rapid decline with limited prognosis.   4. Symptom Management:   1. Anxiety/Agitation: Ativan 1 mg po every 4 hrs prn 2. Pain/Dyspnea: Roxanol 5 mg every 1 hr prn 3. Dysphagia: diet as tolerated with known risk of aspiration  5. Psychosocial:  Emotional support to both daughters.  They verbalize understanding of poor prognosis and hope is for comfort at this time.   6. Spiritual: Chaplain consulted   Brief HPI:   Kristi [name] is a 80 y.o. female with a history of CAD, A.fib, CHF, HTN, and Dementia residing currently at the [facility] who was sent tot he ED after being observed by staff to complain of chills and shivering.  She was found to have a rectal temperature of 101.2 in the ED, and a sepsis workup was initiated and she was placed on empiric IV Vancomycin and Zosyn. Patient has a history of Dementia and is unable to give a history.    ROS: unable to illicit due to decreased cognition   PMH:  Past Medical History  Diagnosis Date  . Atrial fibrillation     Rapid response, hospital, September, 2012  . CHF (congestive heart failure)     Combined systolic and diastolic, EF 40-45%, 2012  . Ejection fraction     EF 40-45%, echo, September, 2012  . History of noncompliance with medical treatment   . Hypertension   . Dyslipidemia   . Dementia      ? mild ?  . CAD (coronary artery disease)     PCI to LAD early 1990s  .  Degenerative joint disease   . Chest pain     Chest wall pain and costochondritis  . CKD (chronic kidney disease), stage IV   . Edema     Left greater than right lower extremity, No DVT by Doppler, September, 2012  . Aortic insufficiency     Mild, echo, September, 2012  . Mitral regurgitation     Moderate, echo, 2012  . Pulmonary hypertension     46 mmHg, echo, 2012  . Hyperlipidemia   . Bradycardia     October, 2012  . Fatigue      October, 2012     PSH: Past Surgical History  Procedure Laterality Date  . Cardiac catheterization    . Cataract extraction    . Colon resection N/A 11/20/2014    Procedure: Diagnostic laparoscopy with placement of drains;  Surgeon: Claud KelpHaywood Ingram, MD;  Location: [facility];  Service: General;  Laterality: N/A;  . Laparoscopy abdomen diagnostic  11/20/2014   I have reviewed the FH and SH and  If appropriate update it with new information. Allergies  Allergen Reactions  . Atenolol Other (See Comments)    unknown  . Ramipril Cough  . Simvastatin Other (See Comments)    unknown   Scheduled Meds: . antiseptic oral rinse  7 mL Mouth Rinse q12n4p  . budesonide (PULMICORT) nebulizer solution  0.25 mg Nebulization BID  . chlorhexidine  15 mL Mouth Rinse BID  . cloNIDine  0.3 mg Transdermal Weekly  . enoxaparin (LOVENOX) injection  30 mg Subcutaneous Q24H  . hydrALAZINE  100 mg Oral 3 times per day  . latanoprost  1 drop Both Eyes QHS  . metoprolol  5 mg Intravenous 4 times per day  . [START ON 11/28/2014] metoprolol succinate  12.5 mg Oral Daily   Continuous Infusions: . 0.9 % sodium chloride with kcl 50 mL/hr at 11/27/14 0403   PRN Meds:.hydrALAZINE, HYDROmorphone (DILAUDID) injection, ondansetron **OR** ondansetron (ZOFRAN) IV    BP 115/58 mmHg  Pulse 60  Temp(Src) 98.7 F (37.1 C) (Axillary)  Resp 20  Ht 5' (1.524 m)  Wt 52.708 kg (116 lb 3.2 oz)  BMI 22.69 kg/m2  SpO2 99%   PPS:20 % at best   Intake/Output Summary (Last 24 hours) at 11/27/14 1104 Last data filed at 11/27/14 0603  Gross per 24 hour  Intake   1660 ml  Output   2450 ml  Net   -790 ml     Physical Exam:  General: chronically ill appearing, NAD, confused  HEENT:  Dry buccal membranes Chest:   Decreased in bases CVS: RRR Abdomen:  Soft, noted dry dressings Ext: without edema Neuro: eys open, garbled speech, unable to follow commands  Labs: CBC    Component Value Date/Time   WBC 19.8*  11/26/2014 0433   RBC 3.30* 11/26/2014 0433   RBC 3.32* 12/18/2011 1741   HGB 10.1* 11/26/2014 0433   HCT 31.7* 11/26/2014 0433   PLT 312 11/26/2014 0433   MCV 96.1 11/26/2014 0433   MCH 30.6 11/26/2014 0433   MCHC 31.9 11/26/2014 0433   RDW 14.2 11/26/2014 0433   LYMPHSABS 2.2 11/26/2014 0433   MONOABS 1.6* 11/26/2014 0433   EOSABS 0.4 11/26/2014 0433   BASOSABS 0.0 11/26/2014 0433    BMET    Component Value Date/Time   NA 140 11/26/2014 0433   K 4.7 11/26/2014 0433   CL 114* 11/26/2014 0433   CO2 20 11/26/2014 0433   GLUCOSE 86 11/26/2014 0433   BUN  11 11/26/2014 0433   CREATININE 1.39* 11/26/2014 0433   CALCIUM 8.2* 11/26/2014 0433   GFRNONAA 35* 11/26/2014 0433   GFRAA 40* 11/26/2014 0433    CMP     Component Value Date/Time   NA 140 11/26/2014 0433   K 4.7 11/26/2014 0433   CL 114* 11/26/2014 0433   CO2 20 11/26/2014 0433   GLUCOSE 86 11/26/2014 0433   BUN 11 11/26/2014 0433   CREATININE 1.39* 11/26/2014 0433   CALCIUM 8.2* 11/26/2014 0433   PROT 6.2 11/25/2014 0858   ALBUMIN 2.8* 11/25/2014 0858   AST 16 11/25/2014 0858   ALT 16 11/25/2014 0858   ALKPHOS 56 11/25/2014 0858   BILITOT 0.5 11/25/2014 0858   GFRNONAA 35* 11/26/2014 0433   GFRAA 40* 11/26/2014 0433    Time In Time Out Total Time Spent with Patient Total Overall Time  0830 0945 70 min 75 min    Greater than 50%  of this time was spent counseling and coordinating care related to the above assessment and plan.   Lorinda CreedMary Kelleen Stolze NP  Palliative Medicine Team Team Phone # [phone] Pager [phone]  Discussed with Dr Susie CassetteAbrol

## 2014-11-28 LAB — CBC
HEMATOCRIT: 32.1 % — AB (ref 36.0–46.0)
Hemoglobin: 10.2 g/dL — ABNORMAL LOW (ref 12.0–15.0)
MCH: 30.4 pg (ref 26.0–34.0)
MCHC: 31.8 g/dL (ref 30.0–36.0)
MCV: 95.8 fL (ref 78.0–100.0)
Platelets: 311 10*3/uL (ref 150–400)
RBC: 3.35 MIL/uL — ABNORMAL LOW (ref 3.87–5.11)
RDW: 14.1 % (ref 11.5–15.5)
WBC: 13.9 10*3/uL — ABNORMAL HIGH (ref 4.0–10.5)

## 2014-11-28 LAB — COMPREHENSIVE METABOLIC PANEL
ALBUMIN: 2.3 g/dL — AB (ref 3.5–5.2)
ALT: 10 U/L (ref 0–35)
AST: 13 U/L (ref 0–37)
Alkaline Phosphatase: 54 U/L (ref 39–117)
Anion gap: 6 (ref 5–15)
BILIRUBIN TOTAL: 0.3 mg/dL (ref 0.3–1.2)
BUN: 11 mg/dL (ref 6–23)
CHLORIDE: 108 meq/L (ref 96–112)
CO2: 21 mmol/L (ref 19–32)
CREATININE: 1.32 mg/dL — AB (ref 0.50–1.10)
Calcium: 8.1 mg/dL — ABNORMAL LOW (ref 8.4–10.5)
GFR calc Af Amer: 43 mL/min — ABNORMAL LOW (ref >90)
GFR calc non Af Amer: 37 mL/min — ABNORMAL LOW (ref >90)
Glucose, Bld: 84 mg/dL (ref 70–99)
POTASSIUM: 4.4 mmol/L (ref 3.5–5.1)
SODIUM: 135 mmol/L (ref 135–145)
Total Protein: 5 g/dL — ABNORMAL LOW (ref 6.0–8.3)

## 2014-11-28 MED ORDER — LORAZEPAM 1 MG PO TABS: 1.0000 mg | ORAL_TABLET | ORAL | Status: AC | PRN

## 2014-11-28 MED ORDER — HYDROCODONE-ACETAMINOPHEN 7.5-325 MG PO TABS: 1.0000 | ORAL_TABLET | Freq: Four times a day (QID) | ORAL | Status: AC

## 2014-11-28 MED ORDER — MORPHINE SULFATE (CONCENTRATE) 10 MG/0.5ML PO SOLN: 5.0000 mg | ORAL | Status: AC | PRN

## 2014-11-28 NOTE — Discharge Summary (Addendum)
Physician Discharge Summary  [name] MRN: [id number] DOB/AGE: Jan 28, 1931 80 y.o.  PCP: No primary care provider on file.   Admit date: 11/15/2014 Discharge date: 11/28/2014  Discharge Diagnoses:      SIRS (systemic inflammatory response syndrome) Active Problems:   Atrial fibrillation   CHF (congestive heart failure)   Hypertension   Dementia   CAD (coronary artery disease)   CKD (chronic kidney disease), stage IV   Leukocytosis   Sepsis   Aspiration pneumonia   Acute on chronic renal failure   Dysphagia   Pneumoperitoneum of unknown etiology   Pneumoperitoneum   Supraumbilical hernia   DNR (do not resuscitate)   Weakness generalized   Palliative care encounter    Patient being discharged to [facility]     Medication List    STOP taking these medications        amLODipine 10 MG tablet  Commonly known as:  NORVASC     cloNIDine 0.3 mg/24hr patch  Commonly known as:  CATAPRES - Dosed in mg/24 hr     ferrous sulfate 325 (65 FE) MG tablet     hydrochlorothiazide 12.5 MG capsule  Commonly known as:  MICROZIDE     loperamide 2 MG capsule  Commonly known as:  IMODIUM     meloxicam 7.5 MG tablet  Commonly known as:  MOBIC     traMADol 50 MG tablet  Commonly known as:  ULTRAM     Vitamin D (Ergocalciferol) 50000 UNITS Caps capsule  Commonly known as:  DRISDOL      TAKE these medications        acetaminophen 500 MG tablet  Commonly known as:  TYLENOL  Take 1,000 mg by mouth every 4 (four) hours as needed for moderate pain, fever or headache.     donepezil 10 MG tablet  Commonly known as:  ARICEPT  Take 10 mg by mouth at bedtime.     furosemide 20 MG tablet  Commonly known as:  LASIX  Take 20 mg by mouth every morning.     gabapentin 300 MG capsule  Commonly known as:  NEURONTIN  Take 300 mg by mouth at bedtime.     GERI-LANTA 200-200-20 MG/5ML suspension  Generic drug:  alum & mag hydroxide-simeth  Take 30 mLs by mouth  every 6 (six) hours as needed for indigestion or heartburn.     guaifenesin 100 MG/5ML syrup  Commonly known as:  ROBITUSSIN  Take 200 mg by mouth 4 (four) times daily as needed for cough.     hydrALAZINE 25 MG tablet  Commonly known as:  APRESOLINE  Take 25 mg by mouth 3 (three) times daily.     hydrALAZINE 25 MG tablet  Commonly known as:  APRESOLINE  Take 1 tablet (25 mg total) by mouth every 8 (eight) hours.     HYDROcodone-acetaminophen 7.5-325 MG per tablet  Commonly known as:  NORCO  Take 1 tablet by mouth 4 (four) times daily.     latanoprost 0.005 % ophthalmic solution  Commonly known as:  XALATAN  Place 1 drop into both eyes at bedtime.     LORazepam 1 MG tablet  Commonly known as:  ATIVAN  Take 1 tablet (1 mg total) by mouth every 4 (four) hours as needed for anxiety.     magnesium hydroxide 400 MG/5ML suspension  Commonly known as:  MILK OF MAGNESIA  Take 30 mLs by mouth at bedtime as needed for mild constipation.  metoprolol succinate 12.5 mg Tb24 24 hr tablet  Commonly known as:  TOPROL-XL  Take 0.5 tablets (12.5 mg total) by mouth daily.     morphine CONCENTRATE 10 MG/0.5ML Soln concentrated solution  Take 0.25 mLs (5 mg total) by mouth every hour as needed for moderate pain, severe pain or shortness of breath.     NAMENDA XR 14 MG Cp24  Generic drug:  Memantine HCl ER  Take 14 mg by mouth every morning.     potassium chloride 10 MEQ tablet  Commonly known as:  K-DUR  Take 10 mEq by mouth every morning.        Discharge Condition: Stable Disposition: 03-Skilled Nursing Facility   Consults: Gen. surgery, palliative care  Significant Diagnostic Studies: Ct Abdomen Pelvis Wo Contrast  11/25/2014   CLINICAL DATA:  Postop abdominal laparoscopic surgery, prior colon resection colon resection and cholecystectomy, pneumonia, CHF, atrial fibrillation, hypertension, coronary artery disease, valvular heart disease, stage 4 chronic kidney disease  EXAM:  CT CHEST, ABDOMEN AND PELVIS WITHOUT CONTRAST  TECHNIQUE: Multidetector CT imaging of the chest, abdomen and pelvis was performed following the standard protocol without IV contrast. Sagittal and coronal MPR images reconstructed from axial data set.  COMPARISON:  CT abdomen and pelvis 11/19/2014  FINDINGS: CT CHEST FINDINGS  Scattered atherosclerotic calcifications aorta, great vessels and coronary arteries.  Ascending aorta measures 3.9 x 3.6 cm.  No thoracic adenopathy, hilar assessment limited by lack of IV contrast.  BILATERAL pleural effusions and compressive atelectasis of the posterior lungs.  Lungs otherwise clear.  No pneumothorax.  Bones demineralized.  CT ABDOMEN AND PELVIS FINDINGS  Air again identified within the biliary system.  Post cholecystectomy.  Liver, spleen, pancreas, kidneys, and adrenal glands otherwise normal.  Percutaneous drains in the upper abdomen and pelvis.  Retained contrast in colon with distal colonic diverticulosis noted.  Foley catheter decompresses urinary bladder.  Scattered atherosclerotic calcifications.  Normal appendix, uterus, and adnexae.  Bowel loops otherwise normal appearance.  Gastric wall appears thickened proximally though this could be an artifact related to underdistention.  No mass, adenopathy, free fluid or abscess.  Tiny supraumbilical ventral hernia containing fat.  IMPRESSION: BILATERAL pleural effusions and compressive atelectasis of the posterior lungs.  Postoperative changes of the abdomen without evidence of abscess.  Distal colonic diverticulosis.  Upper normal caliber ascending thoracic aorta, recommendation below.  Recommend annual imaging followup by CTA or MRA. This recommendation follows 2010 ACCF/AHA/AATS/ACR/ASA/SCA/SCAI/SIR/STS/SVM Guidelines for the Diagnosis and Management of Patients With Thoracic Aortic Disease. Circulation. 2010; 121: X646-O032   Electronically Signed   By: Lavonia Dana M.D.   On: 11/25/2014 14:05   Ct Abdomen Pelvis Wo  Contrast  11/19/2014   CLINICAL DATA:  Fever and chills. Leukocytosis with presumed aspiration pneumonia. History of coronary artery disease, dementia and renal insufficiency. Initial encounter.  EXAM: CT ABDOMEN AND PELVIS WITHOUT CONTRAST  TECHNIQUE: Multidetector CT imaging of the abdomen and pelvis was performed following the standard protocol without IV contrast.  COMPARISON:  Chest and abdominal radiographs 11/18/2014. Report from abdominal CT 01/29/2001.  FINDINGS: Lower chest: There are small to moderate dependent pleural effusions bilaterally with associated dependent bibasilar airspace opacities. These are most consistent with atelectasis. There is no consolidation or significant pericardial effusion. Extensive atherosclerosis of the coronary arteries and thoracic aorta noted. The heart is enlarged.  Hepatobiliary: The liver demonstrates decreased density consistent with steatosis. No focal lesions are identified on non contrast imaging. The gallbladder is surgically absent. There is air within  the extrahepatic and left intrahepatic biliary system.  Pancreas:  Unremarkable on noncontrast imaging.  Spleen: Normal in size without focal abnormality.  Adrenals/Urinary Tract: Both adrenal glands are somewhat indistinct without focal mass or hematoma.The left kidney demonstrates asymmetric cortical thinning and atrophy. There is some extraluminal gas around the left renal hilum, but no definite air within its collecting system. There is no evidence of hydronephrosis or ureteral calculus. The bladder appears unremarkable.  Stomach/Bowel: Contrast from recent esophagram is present within the colon. There is no colonic wall thickening or extravasated enteric contrast. Diverticular changes are present throughout the colon. There is no definite bowel wall thickening or focal surrounding inflammation. However, multiple extraluminal air bubbles are present. These are most pronounced within the right upper quadrant,  inferior to the cholecystectomy bed. Extraluminal air tracks inferiorly along the retroperitoneum. There are several extraluminal air bubbles within the pelvis.There is no free intraperitoneal air or drainable fluid collection.  Vascular/Lymphatic: There are no enlarged abdominal or pelvic lymph nodes. Aortoiliac atherosclerosis noted.  Reproductive: There are vascular calcifications within the uterus. No evidence of adnexal mass.  Other: There is generalized soft tissue edema affecting the subcutaneous and mesenteric fat. No abdominal wall hernia identified.  Musculoskeletal: No acute or significant osseous findings. There are degenerative changes throughout the spine with a grade 1 anterolisthesis at the lower 3 lumbar disc space levels.  IMPRESSION: 1. Multiple extraluminal air collections are present within the abdomen and pelvis, primarily within the retroperitoneum, consistent with a perforated viscus. Because most of the air is in the right upper quadrant, this could be due to a perforated duodenal ulcer. Alternatively, findings could be secondary to perforated sigmoid diverticulitis. There is no free air or drainable fluid collection. 2. Pneumobilia, probably related to previous sphincterotomy or biliary bypass. 3. Bilateral pleural effusions with dependent opacities at both lung bases. No definite aspiration pneumonia demonstrated by this examination. 4. These results were called by telephone at the time of interpretation on 11/19/2014 at 7:50 pm to the patient's nurse, Patrici Ranks, who verbally acknowledged these results.   Electronically Signed   By: Camie Patience M.D.   On: 11/19/2014 19:53   Dg Chest 2 View  11/18/2014   CLINICAL DATA:  Shortness of breath  EXAM: CHEST  2 VIEW  COMPARISON:  Chest radiograph 11/15/2014  FINDINGS: Stable enlarged cardiac and mediastinal contours with tortuosity and calcification of thoracic aorta. Interval development of focal heterogeneous opacity within the right lung  base. Thoracic spine degenerative change.  IMPRESSION: New focal consolidative opacity within the peripheral right lower lobe, concerning for infection in the appropriate clinical setting. Atelectasis would be an alternative consideration. Recommend short term radiographic followup to ensure resolution.   Electronically Signed   By: Lovey Newcomer M.D.   On: 11/18/2014 12:11   Dg Chest 2 View  11/15/2014   CLINICAL DATA:  80 year old female with abnormal shaking, chills. Initial encounter.  EXAM: CHEST  2 VIEW  COMPARISON:  12/17/2011.  FINDINGS: Low lung volumes. Chronic cardiomegaly. Stable cardiac size and mediastinal contours. No pneumothorax or pulmonary edema. No pleural effusion or consolidation. Eventration of the diaphragm re - identified. No acute osseous abnormality identified. Stable cholecystectomy clips.  IMPRESSION: Low lung volumes, otherwise no acute cardiopulmonary abnormality.   Electronically Signed   By: Lars Pinks M.D.   On: 11/15/2014 16:35   Ct Chest Wo Contrast  11/25/2014   CLINICAL DATA:  Postop abdominal laparoscopic surgery, prior colon resection colon resection and cholecystectomy, pneumonia, CHF, atrial  fibrillation, hypertension, coronary artery disease, valvular heart disease, stage 4 chronic kidney disease  EXAM: CT CHEST, ABDOMEN AND PELVIS WITHOUT CONTRAST  TECHNIQUE: Multidetector CT imaging of the chest, abdomen and pelvis was performed following the standard protocol without IV contrast. Sagittal and coronal MPR images reconstructed from axial data set.  COMPARISON:  CT abdomen and pelvis 11/19/2014  FINDINGS: CT CHEST FINDINGS  Scattered atherosclerotic calcifications aorta, great vessels and coronary arteries.  Ascending aorta measures 3.9 x 3.6 cm.  No thoracic adenopathy, hilar assessment limited by lack of IV contrast.  BILATERAL pleural effusions and compressive atelectasis of the posterior lungs.  Lungs otherwise clear.  No pneumothorax.  Bones demineralized.  CT  ABDOMEN AND PELVIS FINDINGS  Air again identified within the biliary system.  Post cholecystectomy.  Liver, spleen, pancreas, kidneys, and adrenal glands otherwise normal.  Percutaneous drains in the upper abdomen and pelvis.  Retained contrast in colon with distal colonic diverticulosis noted.  Foley catheter decompresses urinary bladder.  Scattered atherosclerotic calcifications.  Normal appendix, uterus, and adnexae.  Bowel loops otherwise normal appearance.  Gastric wall appears thickened proximally though this could be an artifact related to underdistention.  No mass, adenopathy, free fluid or abscess.  Tiny supraumbilical ventral hernia containing fat.  IMPRESSION: BILATERAL pleural effusions and compressive atelectasis of the posterior lungs.  Postoperative changes of the abdomen without evidence of abscess.  Distal colonic diverticulosis.  Upper normal caliber ascending thoracic aorta, recommendation below.  Recommend annual imaging followup by CTA or MRA. This recommendation follows 2010 ACCF/AHA/AATS/ACR/ASA/SCA/SCAI/SIR/STS/SVM Guidelines for the Diagnosis and Management of Patients With Thoracic Aortic Disease. Circulation. 2010; 121: U023-X435   Electronically Signed   By: Lavonia Dana M.D.   On: 11/25/2014 14:05   Dg Esophagus  11/18/2014   CLINICAL DATA:  PT C/O DYSPHAGIA, N/V W/MEALS, FAMILY REPORTS H/O ESOPHAGEAL STRICTURE WHICH REQUIRED DILATION "A LONG TIME AGO"  EXAM: ESOPHOGRAM/BARIUM SWALLOW  TECHNIQUE: Single contrast examination was performed using  thin barium.  FLUOROSCOPY TIME:  1 min and 34 seconds  COMPARISON:  None.  FINDINGS: Pharyngeal swallowing function is grossly normal with no aspiration visualized.  Esophagus is normal in course. There is a small sliding hiatal hernia. There is no convincing stricture. No esophageal mass and no evidence of esophagitis. Study was somewhat limited by patient's probability to only take small swallows.  There is esophageal dysmotility with  significant tertiary contractions and barium stasis. No reflux was documented during the exam.  IMPRESSION: 1. Esophageal dysmotility. 2. No convincing mass or stricture. 3. Small hiatal hernia.   Electronically Signed   By: Lajean Manes M.D.   On: 11/18/2014 12:39   Dg Chest Port 1 View  11/23/2014   CLINICAL DATA:  CHF.  Chest pain  EXAM: PORTABLE CHEST - 1 VIEW  COMPARISON:  11/18/2014  FINDINGS: There is moderate cardiac enlargement. Atherosclerotic plaque is noted within the aortic arch. There is a small left pleural effusion. Increase in interstitial edema. Multifocal areas of subsegmental atelectasis noted bilaterally.  IMPRESSION: 1. Increase in CHF pattern.   Electronically Signed   By: Kerby Moors M.D.   On: 11/23/2014 10:43   Dg Abd Portable 1v  11/18/2014   CLINICAL DATA:  Evaluate for clearance of barium from esophagram. Patient scheduled for CT.  EXAM: PORTABLE ABDOMEN - 1 VIEW  COMPARISON:  Earlier same day  FINDINGS: Residual barium contrast material is demonstrated throughout the colon to the level of the rectum. Relative paucity of small bowel gas. Right upper  quadrant surgical clips. Lumbar spine degenerative change.  IMPRESSION: Residual barium is demonstrated throughout the colon to the level of the rectum.   Electronically Signed   By: Lovey Newcomer M.D.   On: 11/18/2014 17:32      Microbiology: Recent Results (from the past 240 hour(s))  Clostridium Difficile by PCR     Status: None   Collection Time: 11/19/14  6:59 PM  Result Value Ref Range Status   C difficile by pcr NEGATIVE NEGATIVE Final    Comment: Performed at [facility]  Surgical pcr screen     Status: Abnormal   Collection Time: 11/20/14  1:32 PM  Result Value Ref Range Status   MRSA, PCR POSITIVE (A) NEGATIVE Final    Comment: RESULT CALLED TO, READ BACK BY AND VERIFIED WITH: Hal Neer RN [id number] 11/20/14 [place]TE V    Staphylococcus aureus POSITIVE (A) NEGATIVE Final    Comment:        The  Xpert SA Assay (FDA approved for NASAL specimens in patients over 3 years of age), is one component of a comprehensive surveillance program.  Test performance has been validated by [employer] for patients greater than or equal to 35 year old. It is not intended to diagnose infection nor to guide or monitor treatment.      Labs: Results for orders placed or performed during the hospital encounter of 11/15/14 (from the past 48 hour(s))  Comprehensive metabolic panel     Status: Abnormal   Collection Time: 11/28/14  5:15 AM  Result Value Ref Range   Sodium 135 135 - 145 mmol/L    Comment: Please note change in reference range.   Potassium 4.4 3.5 - 5.1 mmol/L    Comment: Please note change in reference range.   Chloride 108 96 - 112 mEq/L   CO2 21 19 - 32 mmol/L   Glucose, Bld 84 70 - 99 mg/dL   BUN 11 6 - 23 mg/dL   Creatinine, Ser 1.32 (H) 0.50 - 1.10 mg/dL   Calcium 8.1 (L) 8.4 - 10.5 mg/dL   Total Protein 5.0 (L) 6.0 - 8.3 g/dL   Albumin 2.3 (L) 3.5 - 5.2 g/dL   AST 13 0 - 37 U/L   ALT 10 0 - 35 U/L   Alkaline Phosphatase 54 39 - 117 U/L   Total Bilirubin 0.3 0.3 - 1.2 mg/dL   GFR calc non Af Amer 37 (L) >90 mL/min   GFR calc Af Amer 43 (L) >90 mL/min    Comment: (NOTE) The eGFR has been calculated using the CKD EPI equation. This calculation has not been validated in all clinical situations. eGFR's persistently <90 mL/min signify possible Chronic Kidney Disease.    Anion gap 6 5 - 15  CBC     Status: Abnormal   Collection Time: 11/28/14  5:15 AM  Result Value Ref Range   WBC 13.9 (H) 4.0 - 10.5 K/uL   RBC 3.35 (L) 3.87 - 5.11 MIL/uL   Hemoglobin 10.2 (L) 12.0 - 15.0 g/dL   HCT 32.1 (L) 36.0 - 46.0 %   MCV 95.8 78.0 - 100.0 fL   MCH 30.4 26.0 - 34.0 pg   MCHC 31.8 30.0 - 36.0 g/dL   RDW 14.1 11.5 - 15.5 %   Platelets 311 150 - 400 K/uL     HPI :Kristi [name] is a 80 y.o. female with a history of CAD, A.fib, CHF, HTN, and Dementia residing at the  [facility]  SNF who was sent to the ED after being observed by staff to complain of chills and shivering during the afternoon after eating her lunch. She was found to have a rectal temperature of 101.2 in the ED, and a sepsis workup was initiated. CXR without evidence of acute disease.  She was admitted, found to have very elevated WBC. CXR was normal however. She has had two speech evaluations, without overt aspiration noted. Daughter mentioned to SLP that her mother has had swallowing issues in the past and has undergone esophageal dilations.   CT scan showed pneumoperitoneum with retroperitoneal pneumobilia of uncertain etiology Diagnostic laparoscopy of abdomen and pelvis, placement of drains, 11/20/2014, Fanny Skates, [place]:  Sepsis secondary to HCAP/aspiration pneumonia vs peritonitis Persistent leukocytosis during this hospitalization, afebrile,Pt refused lab draws this am Initial chest x-ray 11/18/2014 showed RLL consolidation, however CT scan 12/26 did not show a pneumonia just bilateral pleural effusions, no intra-abdominal abscess -Influenza PCR negative Received a prolonged course of broad-spectrum antibiotics Discontinued Zosyn, and vancomycin  -Blood cultures negative from 11/15/2014 -Urinalysis also negative   Shortness of breath Could be a combination of right lower lobe pneumonia secondary to aspiration vs volume overload secondary to aggressive IV hydration We have discontinued IV fluids Patient required    IV Lasix, to relieve her shortness of breath post surgery continue low-dose Lasix for comfort CT scan confirmed bilateral pleural effusions Patient  not requiring any oxygen prior to discharge   Pneumoperitoneum -Diagnostic laparoscopy with placement of drains - 11/20/2014 - H. Dalbert Batman Had pneumoperitoneum without clear source. She had 2 drains which  had benign appearing serous fluid, they were removed 12/27 by  surgery after repeat scan did not show any intra-abdominal abscess Progressed on a regular diet Patient wants to be made comfortable, palliative care consultation completed and recommend comfort care approach   Acute on chronic renal failure, stage III -Baseline creatinine appears to be 1.1-1.4, patient refusing labs Creatinine slightly today probably because of the Lasix given 12/24, will not restart fluid Patient would not allow to monitor BMP,   Atrial fibrillation -Currently rate controlled Started on Toprol-XL Discontinue IV metoprolol   Hypertension Continue antihypertensives as above    Dysphagia with history of esophageal stricture -Barium esophagogram was negative for stricture shows esophageal dysmotility  Dementia Restarted Aricept and Namenda  Chronic diastolic heart failure -Currently appears euvolemic and compensated -Lasix restarted -Continue to monitor daily weights, intake and output  Hypernatremia/hypokalemia -Resolved likely secondary to volume depletion Replete potassium orally  Normocytic anemia -Hemoglobin appears to be stable, hemoglobin 11.3 Patient would not allow monitoring of CBC   Code Status: DO NOT RESUSCITATE,  Family Communication: called patient and updated her [place],Shirley Daughter [phone]  She would like to be conservative in terms of treatment    Discharge Exam:    Blood pressure 129/60, pulse 70, temperature 98.4 F (36.9 C), temperature source Axillary, resp. rate 16, height 5' (1.524 m), weight 52.708 kg (116 lb 3.2 oz), SpO2 99 %.  Would not let me examine her       Discharge Instructions    Diet - low sodium heart healthy    Complete by:  As directed      Increase activity slowly    Complete by:  As directed            Follow-up Information    Follow up with Adin Hector, MD. Schedule an appointment as soon as possible for a visit in 2 weeks.  Specialty:  General Surgery    Contact information:   [address] [place] [address] [phone]       Signed: Reyne Dumas 11/28/2014, 11:06 AM

## 2014-11-28 NOTE — Progress Notes (Signed)
Report given to Robet LeuPat G. [facility],informations given appropriately.Hulda Marin- Mikell Kazlauskas RN

## 2014-11-28 NOTE — Consult Note (Signed)
[facility] Liaison: [name]. Spoke with daughter Kristi [name] by phone to confirm interest. Daughter Kristi [name] on way to hospital now to complete paperwork for transfer. Will need DC summary faxed to [phone] and RN to call report to [phone]. Will let CSW know when paper work complete. Thank you. [name] Kristi Dobratz LCSW [phone]

## 2014-11-28 NOTE — Progress Notes (Signed)
[facility],patient is stable on d/c. Family present at this time.Hulda Marin- Aleese Kamps RN

## 2014-11-28 NOTE — Progress Notes (Signed)
CS[facility], [name] Davis that [facility]. [facility] liaison, [name] Davis had made pt daughter, Cardell PeachGay aware.   CSW facilitated pt discharge needs including contacting facility, faxing pt discharge information, providing RN phone number to call report, discussing with pt daughters at bedside, and arranging [facility].   Pt daughters very appreciative of CSW support and assistance. Pt daughters were relieved [facility].  No further social work needs identified at this time.  CSW signing off.   Loletta SpecterSuzanna Tauni Sanks, MSW, LCSW Clinical Social Work [phone]

## 2014-11-28 NOTE — Progress Notes (Signed)
Patient's d/c instructions given to patient's daughter,verbalized understanding,[facility].- Hulda Marinonna Jaxen Samples RN

## 2014-11-28 NOTE — Progress Notes (Addendum)
Rt went to do neb tx. Pt acting out saying get that out of my face refusing to do neb and letting RT get vitals.Family at bedside stated to stop neb due to pt behavior. Pt in no distress at this time. RN aware.

## 2014-11-28 NOTE — Care Management Note (Signed)
CARE MANAGEMENT NOTE 11/28/2014  Patient:  Kristi [name],Kristi [name]   Account Number:  [id number]  Date Initiated:  11/16/2014  Documentation initiated by:  Jiles CrockerHANDLER,BRENDA  Subjective/Objective Assessment:   ADMITTED WITH SIRS     Action/Plan:   PATIENT RESIDES IN ALF; SOC WORKER REFERRAL PLACED   Anticipated DC Date:  11/28/2014   Anticipated DC Plan:  [facility]OSPICE [facility]  In-house referral  Clinical Social Worker      DC Planning Services  CM consult      Choice offered to / List presented to:             Status of service:  In process, will continue to follow Medicare Important Message given?  YES (If response is "NO", the following Medicare IM given date fields will be blank) Date Medicare IM given:  11/28/2014 Medicare IM given by:  Sandford CrazeLEMENTS,Promise Weldin Date Additional Medicare IM given:   Additional Medicare IM given by:    Discharge Disposition:    Per UR Regulation:  Reviewed for med. necessity/level of care/duration of stay  If discussed at Long Length of Stay Meetings, dates discussed:   11/21/2014  11/23/2014  11/28/2014    Comments:  11/28/14 Sandford CrazeNora Irean Kendricks RN,BSN,NCM [facility].  11/23/14 MMcGibboney, RN, BSN Note pt from ALF. Chart reviewed. Pt has bed offers at [facility], not ready/[facility] today. [facility]  12/17/2015Abelino Derrick- B CHANDLER RN,BSN,MHA [phone]

## 2015-01-30 DEATH — deceased

## 2015-11-26 IMAGING — CT CT ABD-PELV W/O CM
1 of 2 series · 14 of 32 positions shown, 19 images · non-contrast
Comparison: CT abdomen and pelvis 11/19/2014

CLINICAL DATA: Postop abdominal laparoscopic surgery, prior colon
resection colon resection and cholecystectomy, pneumonia, CHF,
atrial fibrillation, hypertension, coronary artery disease, valvular
heart disease, stage 4 chronic kidney disease

EXAM:
CT CHEST, ABDOMEN AND PELVIS WITHOUT CONTRAST
TECHNIQUE: Multidetector CT imaging of the chest, abdomen and pelvis was
performed following the standard protocol without IV contrast.
Sagittal and coronal MPR images reconstructed from axial data set.

[Series 2: cap w/o w/o st · axial · non-contrast · 0.68mm/px · z∈[-577,-82]mm · 14 of 111 slices shown, 19 images]
[im 6/111  soft-tissue]
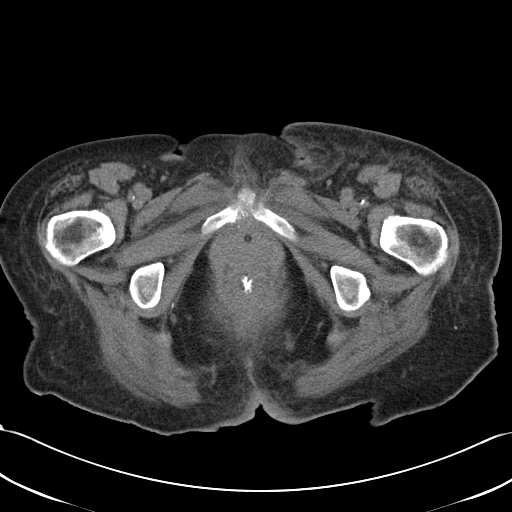
[im 6/111  bone]
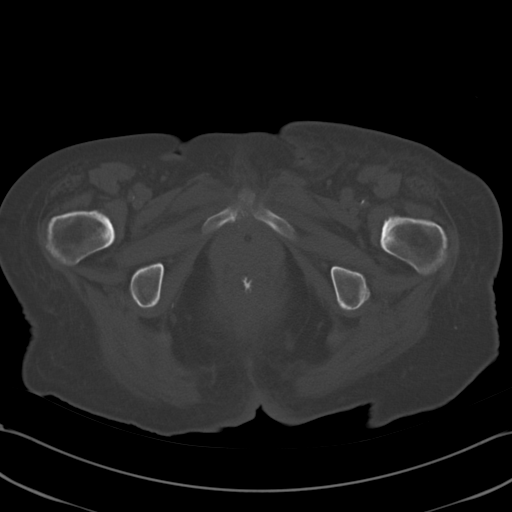
[im 16/111  soft-tissue]
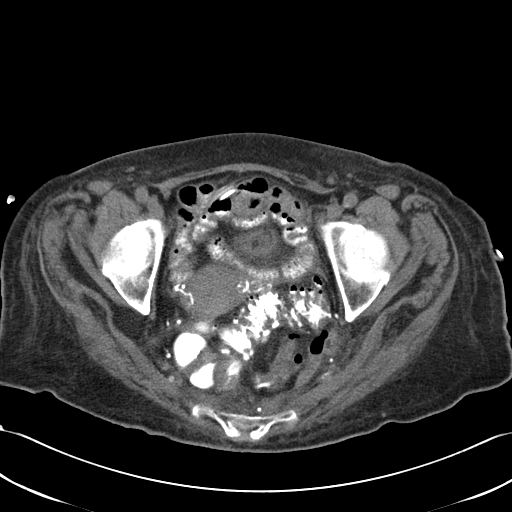
[im 21/111  soft-tissue]
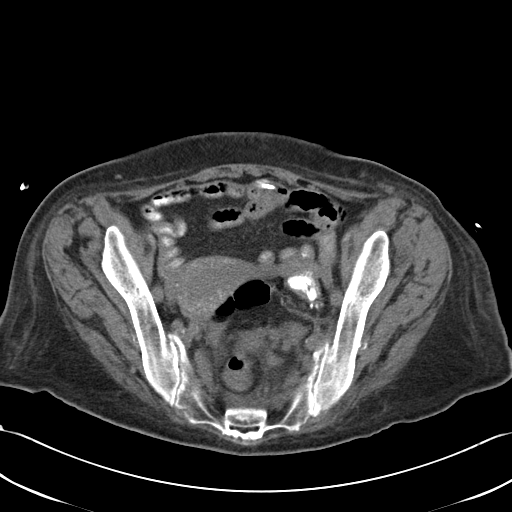
[im 32/111  soft-tissue]
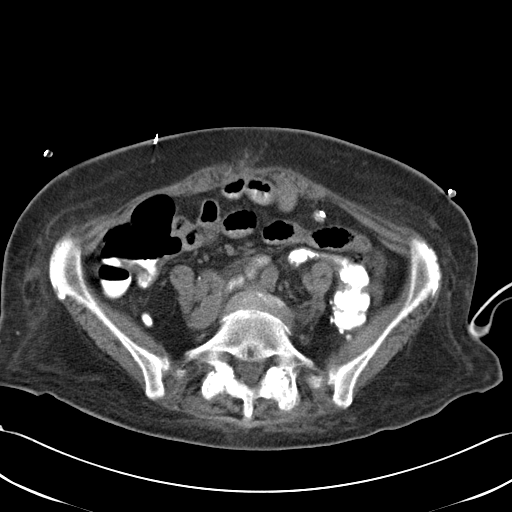
[im 37/111  soft-tissue]
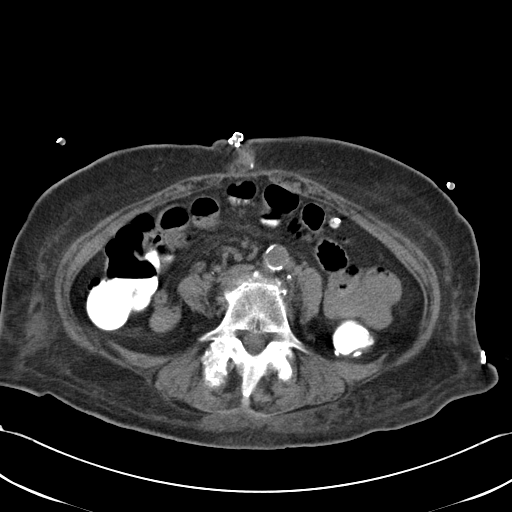
[im 48/111  soft-tissue]
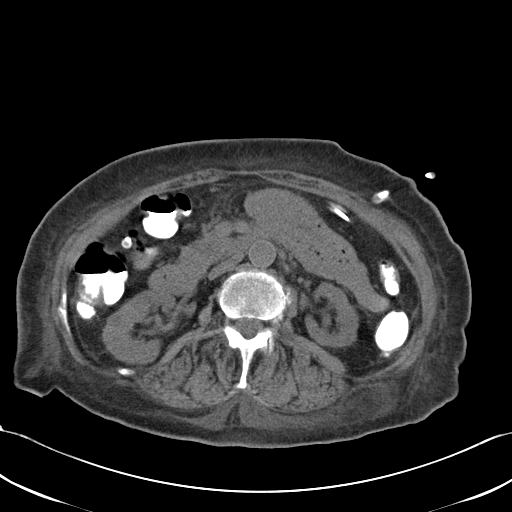
[im 58/111  soft-tissue]
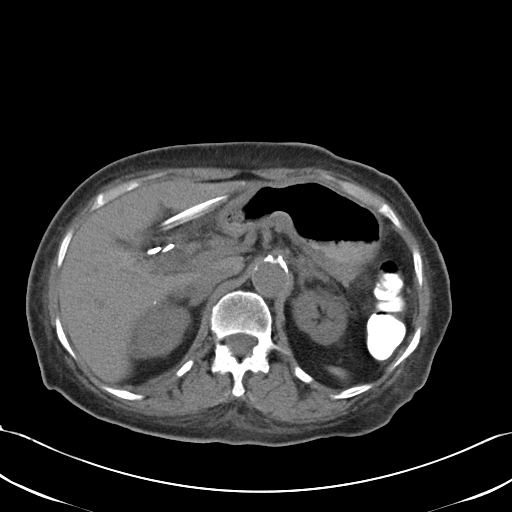
[im 63/111  soft-tissue]
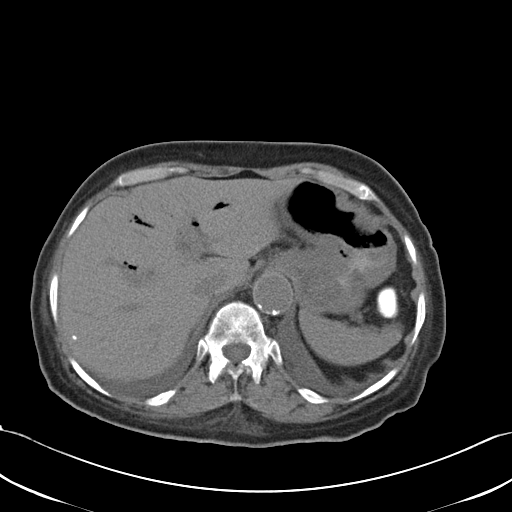
[im 74/111  soft-tissue]
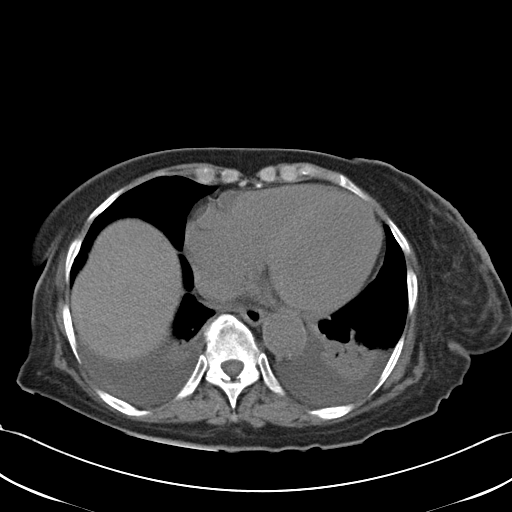
[im 74/111  bone]
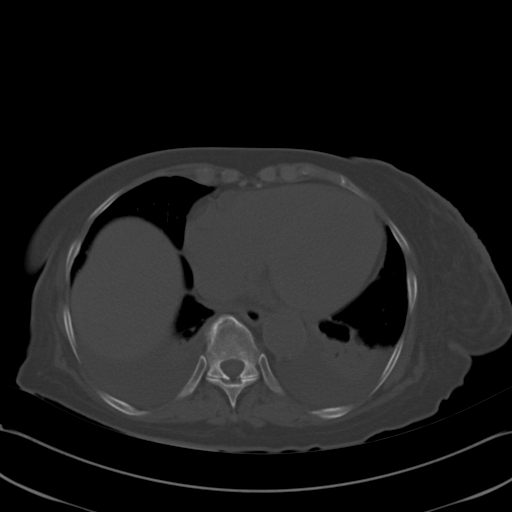
[im 79/111  soft-tissue]
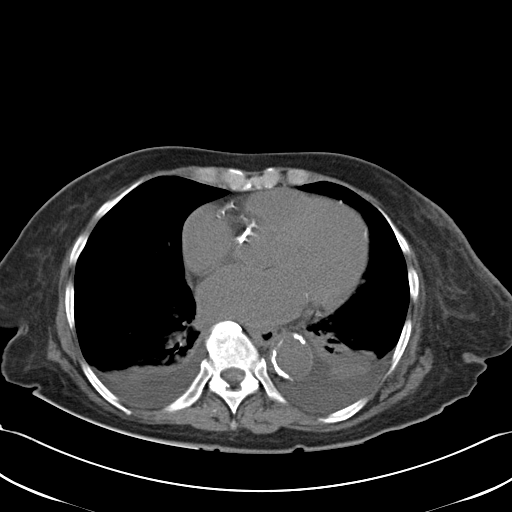
[im 90/111  soft-tissue]
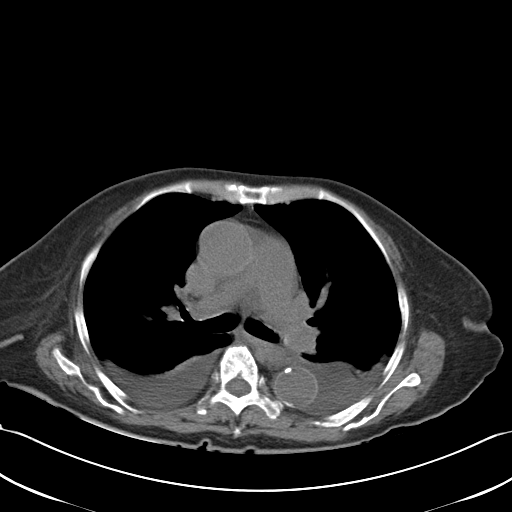
[im 90/111  lung]
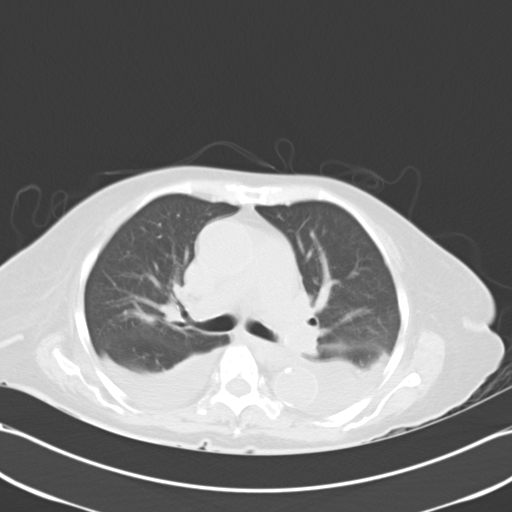
[im 95/111  soft-tissue]
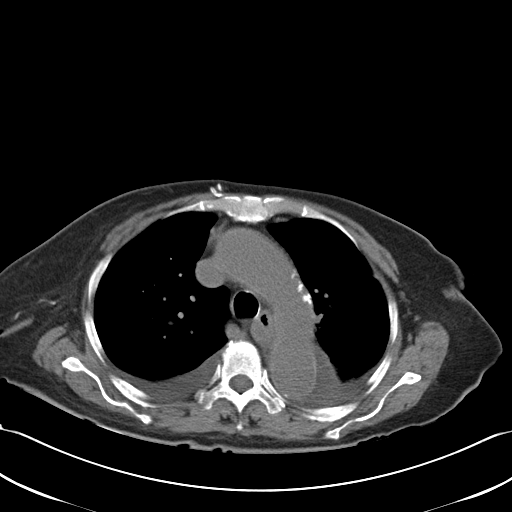
[im 95/111  lung]
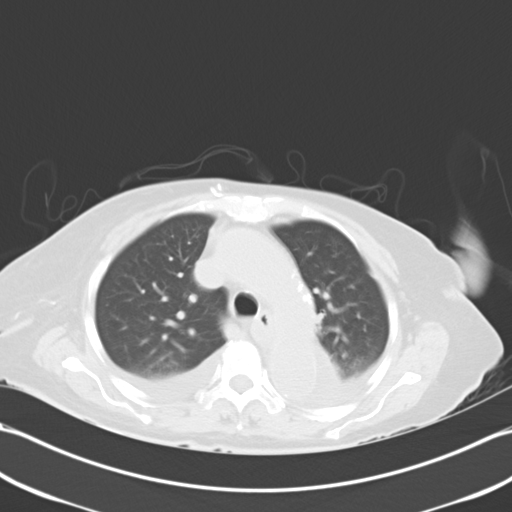
[im 100/111  lung]
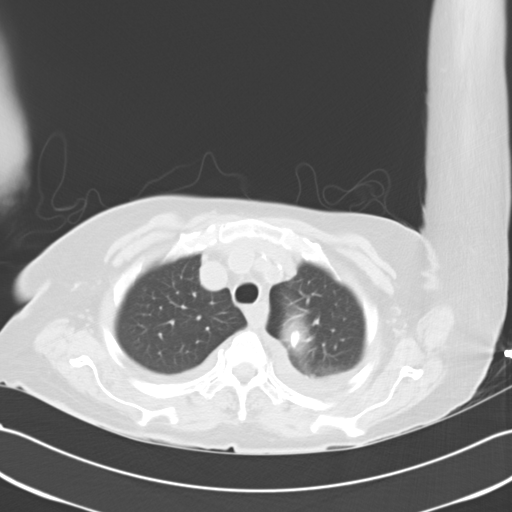
[im 105/111  soft-tissue]
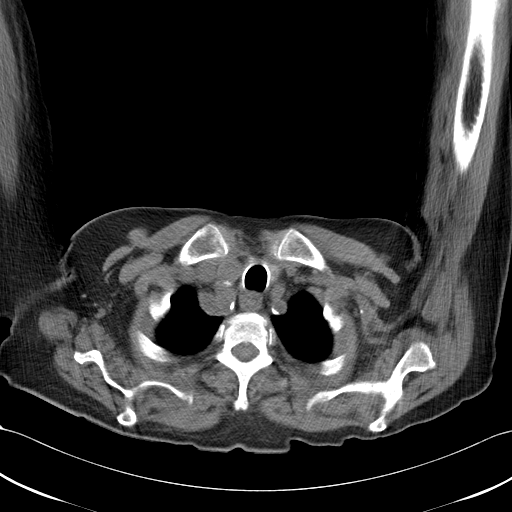
[im 105/111  lung]
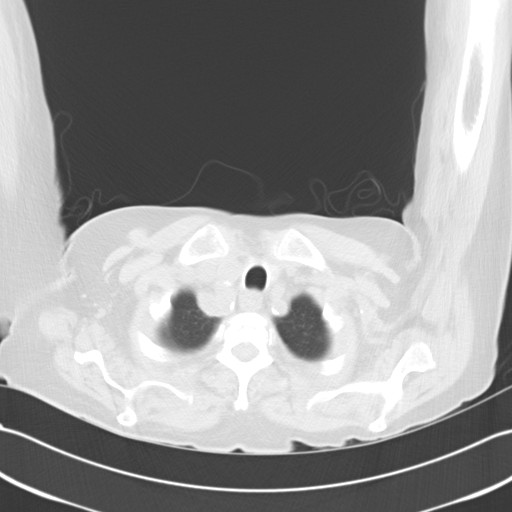

[14 of 32 positions shown; findings below may reference images not displayed]

FINDINGS: CT CHEST FINDINGS

Scattered atherosclerotic calcifications aorta, great vessels and
coronary arteries.

Ascending aorta measures 3.9 x 3.6 cm.

No thoracic adenopathy, hilar assessment limited by lack of IV
contrast.

BILATERAL pleural effusions and compressive atelectasis of the
posterior lungs.

Lungs otherwise clear.

No pneumothorax.

Bones demineralized.

CT ABDOMEN AND PELVIS FINDINGS

Air again identified within the biliary system.

Post cholecystectomy.

Liver, spleen, pancreas, kidneys, and adrenal glands otherwise
normal.

Percutaneous drains in the upper abdomen and pelvis.

Retained contrast in colon with distal colonic diverticulosis noted.

Foley catheter decompresses urinary bladder.

Scattered atherosclerotic calcifications.

Normal appendix, uterus, and adnexae.

Bowel loops otherwise normal appearance.

Gastric wall appears thickened proximally though this could be an
artifact related to underdistention.

No mass, adenopathy, free fluid or abscess.

Tiny supraumbilical ventral hernia containing fat.
IMPRESSION: BILATERAL pleural effusions and compressive atelectasis of the
posterior lungs.

Postoperative changes of the abdomen without evidence of abscess.

Distal colonic diverticulosis.

Upper normal caliber ascending thoracic aorta, recommendation below.

Recommend annual imaging followup by CTA or MRA. This recommendation
follows 8595 ACCF/AHA/AATS/ACR/ASA/SCA/GERALDINE/JENS-PETTER/LIRDON/IBAN Guidelines
for the Diagnosis and Management of Patients With Thoracic Aortic
Disease. Circulation. 8595; 121: [id number]
